# Patient Record
Sex: Male | Born: 1995 | Race: Black or African American | Hispanic: No | Marital: Single | Smoking: Current every day smoker
Health system: Southern US, Community
[De-identification: ages and names within clinical notes are randomized; demographics above are authoritative.]

## PROBLEM LIST (undated history)

## (undated) ENCOUNTER — Emergency Department (HOSPITAL_COMMUNITY): Admission: EM | Payer: Self-pay | Source: Home / Self Care

## (undated) DIAGNOSIS — Y249XXA Unspecified firearm discharge, undetermined intent, initial encounter: Secondary | ICD-10-CM

## (undated) DIAGNOSIS — F319 Bipolar disorder, unspecified: Secondary | ICD-10-CM

## (undated) DIAGNOSIS — F32A Depression, unspecified: Secondary | ICD-10-CM

## (undated) DIAGNOSIS — D696 Thrombocytopenia, unspecified: Secondary | ICD-10-CM

## (undated) DIAGNOSIS — F209 Schizophrenia, unspecified: Secondary | ICD-10-CM

## (undated) DIAGNOSIS — W3400XA Accidental discharge from unspecified firearms or gun, initial encounter: Secondary | ICD-10-CM

---
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
## 2013-07-11 ENCOUNTER — Telehealth: Payer: Self-pay | Admitting: Oncology

## 2013-07-11 NOTE — Telephone Encounter (Signed)
S/W PT'S MOTHER IN REF TO NP APPT. ON 07/28/13@10 :30 REFERRING DR August Saucer DX LOW PLATELET COUNT MIALED NP PACKET

## 2013-07-11 NOTE — Telephone Encounter (Signed)
C/D 07/11/13 for appt. 07/28/13

## 2013-07-22 ENCOUNTER — Other Ambulatory Visit: Payer: Self-pay | Admitting: Oncology

## 2013-07-22 DIAGNOSIS — D696 Thrombocytopenia, unspecified: Secondary | ICD-10-CM

## 2013-07-28 ENCOUNTER — Telehealth: Payer: Self-pay | Admitting: Oncology

## 2013-07-28 ENCOUNTER — Other Ambulatory Visit (HOSPITAL_BASED_OUTPATIENT_CLINIC_OR_DEPARTMENT_OTHER): Payer: Medicaid Other | Admitting: Lab

## 2013-07-28 ENCOUNTER — Encounter: Payer: Self-pay | Admitting: Oncology

## 2013-07-28 ENCOUNTER — Ambulatory Visit: Payer: Medicaid Other

## 2013-07-28 ENCOUNTER — Ambulatory Visit (HOSPITAL_BASED_OUTPATIENT_CLINIC_OR_DEPARTMENT_OTHER): Payer: Medicaid Other | Admitting: Oncology

## 2013-07-28 VITALS — BP 126/77 | HR 54 | Temp 97.6°F | Resp 20 | Ht 70.0 in | Wt 175.3 lb

## 2013-07-28 DIAGNOSIS — D696 Thrombocytopenia, unspecified: Secondary | ICD-10-CM

## 2013-07-28 DIAGNOSIS — D709 Neutropenia, unspecified: Secondary | ICD-10-CM

## 2013-07-28 DIAGNOSIS — D72820 Lymphocytosis (symptomatic): Secondary | ICD-10-CM

## 2013-07-28 LAB — CBC WITH DIFFERENTIAL/PLATELET
Basophils Absolute: 0 10*3/uL (ref 0.0–0.1)
EOS%: 3.4 % (ref 0.0–7.0)
HGB: 15.1 g/dL (ref 13.0–17.1)
MCH: 29.5 pg (ref 27.2–33.4)
MCV: 88.1 fL (ref 79.3–98.0)
MONO%: 9.1 % (ref 0.0–14.0)
NEUT#: 1.1 10*3/uL — ABNORMAL LOW (ref 1.5–6.5)
RBC: 5.12 10*6/uL (ref 4.20–5.82)
RDW: 13.1 % (ref 11.0–14.6)
lymph#: 2 10*3/uL (ref 0.9–3.3)
nRBC: 0 % (ref 0–0)

## 2013-07-28 LAB — COMPREHENSIVE METABOLIC PANEL (CC13)
ALT: 15 U/L (ref 0–55)
AST: 20 U/L (ref 5–34)
Albumin: 4.3 g/dL (ref 3.5–5.0)
Alkaline Phosphatase: 131 U/L (ref 40–150)
Glucose: 104 mg/dl (ref 70–140)
Potassium: 4.2 mEq/L (ref 3.5–5.1)
Sodium: 140 mEq/L (ref 136–145)
Total Bilirubin: 1.09 mg/dL (ref 0.20–1.20)
Total Protein: 7.6 g/dL (ref 6.4–8.3)

## 2013-07-28 NOTE — Progress Notes (Signed)
Patient has not been to [place] or been around anyone that he knows of, no financial issues with checking in new patient. He doesn't know PCP

## 2013-07-28 NOTE — Progress Notes (Signed)
Note dictated

## 2013-07-28 NOTE — Telephone Encounter (Signed)
gv adn printed appt sched and avs for pt for June 2015  °

## 2013-07-29 NOTE — Progress Notes (Signed)
CC:   Eric L. August Saucer, M.D.  REASON FOR REFERRAL:  Thrombocytopenia.  HISTORY OF PRESENT ILLNESS:  This is a pleasant 17 year old [place] American gentleman, currently of [place] but has been living recently in [place] due to job restrictions.  He is in excellent health and shape without really any complaints.  As part of a routine physical while he was living in [place], he was noted to have thrombocytopenia on a CBC.  His platelet count was 95, his total white cell count was 3.6, hemoglobin was 15.  He had an essentially normal differential except for a slightly increased lymphocyte of 51%, and he was recommended to have a repeat blood check here in [place].  He did have a CBC repeated on 06/30/2013 and showed his white cell count was 3.3, platelet count was 110, and his lymphocyte percentage was 64%, and the rest of his laboratory data all essentially normal.  For that reason, patient referred to me for evaluation.  Clinically, the patient is asymptomatic.  He is not reporting any fevers, chills, sweats, or weight loss.  Does not report any appetite changes, decline in his energy or performance status.  Does not report any fevers.  Does not report any abdominal pain, chest pain, difficulty breathing.  Does not report any hematochezia or melena.  Does not report any epistaxis.  Does not report any easy bruisability.  Does not recall any recent illnesses. Does not report any over-the-counter medications or supplements.  He is very active and works out regularly, although he denied any health supplements.  REVIEW OF SYSTEMS:  Otherwise unremarkable, and the rest of his review of systems is in the history of present illness.  PAST MEDICAL HISTORY:  None.  PAST SURGICAL HISTORY:  He has had a hernia repair as well as tracheal surgery as a child.  MEDICATIONS:  None.  ALLERGIES:  None.  SOCIAL HISTORY:  He is single.  Denied any alcohol or tobacco abuse.  FAMILY HISTORY:   He was accompanied by his mother today, who provided most of that.  There were really no health issues noted in his family. There is no history of any leukemias or cancers or any blood disorders or thrombocytopenia.  PHYSICAL EXAMINATION:  General:  Alert, awake gentleman, appeared in no active distress.  Vital signs:  His blood pressure is 126/77, pulse 54, respiration 20.  He was afebrile.  HEENT:  Head is normocephalic, atraumatic.  Pupils equal and round, reactive to light.  Oral mucosa moist and pink.  Neck:  Supple without lymphadenopathy.  Heart:  Regular rate and rhythm.  S1 and S2.  Lungs:  Clear to auscultation without rhonchi, wheeze or dullness to percussion.  Abdomen:  Soft, nontender. Could not appreciate any splenomegaly.  His liver is not enlarged. Neurologic:  Intact motor, sensory, and deep tendon reflexes.  LABORATORY DATA:  Hemoglobin of 15, white cell count 3.5, platelet count of 111.  His neutrophil percentage is slightly down at 28%, lymphocytes of 57%.  His platelet count is 111.  Peripheral smear was personally reviewed today and essentially was unremarkable.  His platelet counts were decreased in number but enlarged slightly in size, but really no other abnormalities.  No schistocytes or red cell fragmentation.  His white cells appeared normal.  ASSESSMENT AND PLAN:  An 17 year old gentleman with the following issues: 1. Thrombocytopenia is very mild and fluctuating at this point.     Dating back for the last few months, it was as low as 95, now  it is     111.  Differential diagnosis at this time includes reactive     thrombocytopenia, possible autoimmune thrombocytopenia such as ITP.     I do not see any medications to suggest that effect.  He denied any     other supplements or over-the-counter medication.  I doubt this is     a sign of a lymphoproliferative disorder or a myeloproliferative     disorder.  For the time being, we will continue the observation  and     repeat count in about 6 months or so. 2. Neutropenia and mild lymphocytosis.  Again, I do not see any     evidence to suggest lymphoproliferative disorder.  I think it is     important to monitor his counts to make sure this does not     translate into a blood disorder.  I see no evidence of that at this     time.  All his questions were answered today.    ______________________________ Benjiman Core, M.D. FNS/MEDQ  D:  07/28/2013  T:  07/29/2013  Job:  [id number]

## 2013-10-28 ENCOUNTER — Telehealth: Payer: Self-pay | Admitting: Oncology

## 2013-10-28 NOTE — Telephone Encounter (Signed)
[facility]

## 2013-11-15 ENCOUNTER — Telehealth: Payer: Self-pay | Admitting: Oncology

## 2013-11-15 NOTE — Telephone Encounter (Signed)
, °

## 2014-03-28 ENCOUNTER — Ambulatory Visit: Payer: Medicaid Other | Admitting: Oncology

## 2014-03-28 ENCOUNTER — Other Ambulatory Visit: Payer: Medicaid Other

## 2014-03-29 ENCOUNTER — Other Ambulatory Visit: Payer: Medicaid Other

## 2014-03-29 ENCOUNTER — Ambulatory Visit: Payer: Medicaid Other | Admitting: Oncology

## 2015-04-01 ENCOUNTER — Emergency Department (HOSPITAL_COMMUNITY)
Admission: EM | Admit: 2015-04-01 | Discharge: 2015-04-07 | Disposition: A | Discharge status: Home / Self Care | Payer: Self-pay | Attending: Emergency Medicine | Admitting: Emergency Medicine

## 2015-04-01 ENCOUNTER — Encounter (HOSPITAL_COMMUNITY): Payer: Self-pay | Admitting: *Deleted

## 2015-04-01 DIAGNOSIS — F29 Unspecified psychosis not due to a substance or known physiological condition: Secondary | ICD-10-CM | POA: Insufficient documentation

## 2015-04-01 DIAGNOSIS — F1210 Cannabis abuse, uncomplicated: Secondary | ICD-10-CM | POA: Diagnosis present

## 2015-04-01 DIAGNOSIS — F23 Brief psychotic disorder: Secondary | ICD-10-CM | POA: Diagnosis present

## 2015-04-01 LAB — COMPREHENSIVE METABOLIC PANEL
ALBUMIN: 4.7 g/dL (ref 3.5–5.0)
ALT: 17 U/L (ref 17–63)
AST: 32 U/L (ref 15–41)
Alkaline Phosphatase: 115 U/L (ref 38–126)
Anion gap: 12 (ref 5–15)
BILIRUBIN TOTAL: 2.7 mg/dL — AB (ref 0.3–1.2)
BUN: 15 mg/dL (ref 6–20)
CALCIUM: 10.1 mg/dL (ref 8.9–10.3)
CO2: 27 mmol/L (ref 22–32)
Chloride: 101 mmol/L (ref 101–111)
Creatinine, Ser: 1.17 mg/dL (ref 0.61–1.24)
Glucose, Bld: 96 mg/dL (ref 65–99)
Potassium: 3.3 mmol/L — ABNORMAL LOW (ref 3.5–5.1)
SODIUM: 140 mmol/L (ref 135–145)
TOTAL PROTEIN: 7.7 g/dL (ref 6.5–8.1)

## 2015-04-01 LAB — CBC
HEMATOCRIT: 48.9 % (ref 39.0–52.0)
Hemoglobin: 16.6 g/dL (ref 13.0–17.0)
MCH: 29.4 pg (ref 26.0–34.0)
MCHC: 33.9 g/dL (ref 30.0–36.0)
MCV: 86.5 fL (ref 78.0–100.0)
PLATELETS: 98 10*3/uL — AB (ref 150–400)
RBC: 5.65 MIL/uL (ref 4.22–5.81)
RDW: 13.1 % (ref 11.5–15.5)
WBC: 5.1 10*3/uL (ref 4.0–10.5)

## 2015-04-01 LAB — ACETAMINOPHEN LEVEL: Acetaminophen (Tylenol), Serum: <10 ug/mL — ABNORMAL LOW (ref 10–30)

## 2015-04-01 LAB — SALICYLATE LEVEL

## 2015-04-01 LAB — ETHANOL

## 2015-04-01 MED ORDER — ZOLPIDEM TARTRATE 5 MG PO TABS
5.0000 mg | ORAL_TABLET | Freq: Every evening | ORAL | Status: DC | PRN
  Administered 2015-04-03: 5 mg via ORAL
  Filled 2015-04-01 (×2): qty 1

## 2015-04-01 MED ORDER — IBUPROFEN 400 MG PO TABS: 600.0000 mg | ORAL_TABLET | Freq: Three times a day (TID) | ORAL | Status: DC | PRN

## 2015-04-01 MED ORDER — LORAZEPAM 1 MG PO TABS
1.0000 mg | ORAL_TABLET | Freq: Three times a day (TID) | ORAL | Status: DC | PRN
  Administered 2015-04-03: 1 mg via ORAL
  Filled 2015-04-01 (×3): qty 1

## 2015-04-01 MED ORDER — ZIPRASIDONE MESYLATE 20 MG IM SOLR
10.0000 mg | INTRAMUSCULAR | Status: DC | PRN
  Administered 2015-04-01 – 2015-04-04 (×3): 10 mg via INTRAMUSCULAR
  Filled 2015-04-01 (×4): qty 20

## 2015-04-01 MED ORDER — ACETAMINOPHEN 325 MG PO TABS: 650.0000 mg | ORAL_TABLET | ORAL | Status: DC | PRN

## 2015-04-01 MED ORDER — ONDANSETRON HCL 4 MG PO TABS: 4.0000 mg | ORAL_TABLET | Freq: Three times a day (TID) | ORAL | Status: DC | PRN

## 2015-04-01 MED ORDER — NICOTINE 21 MG/24HR TD PT24: 21.0000 mg | MEDICATED_PATCH | Freq: Every day | TRANSDERMAL | Status: DC

## 2015-04-01 NOTE — ED Notes (Signed)
Safety sitter at the bedside. °

## 2015-04-01 NOTE — [facility] Counselor (Signed)
Disposition: Per Hulan Fess, NP Pt meets inpt tx criteria. Per Rutha Bouchard, [facility] is at capacity. TTS to seek placement elsewhere.

## 2015-04-01 NOTE — ED Notes (Signed)
Staffing office notified of a sitter need.  Security at the bedside now.  Chg rn notified of pts needs

## 2015-04-01 NOTE — ED Provider Notes (Signed)
CSN: [id number]     Arrival date & time 04/01/15  1805 History   First MD Initiated Contact with Patient 04/01/15 1831     Chief Complaint  Patient presents with  . poss overdose      (Consider location/radiation/quality/duration/timing/severity/associated sxs/prior Treatment) HPI Comments: Patient brought to the emergency department by his grandmother. Patient normally lives with his parents, they are out of town. The grandmother came over to stay with him, but he disappeared on Wednesday, June 15. He did not return until Friday, June 17. It's not clear what he was doing, because when he returned he was somewhat agitated, confused, acting strangely. He has progressively worsened since that time. Family reports that he has not slept since he came home on the 17th. He has been progressively more agitated and seems to be speaking to individuals that are not there. At arrival to the ER he was extremely agitated and required restraints and took an extremely long time to verbally de-escalate him to the point where we could draw labs and get him to come to place in the room. He requires constant redirection by his grandmother. He is talking to someone that is not present in the room. He does not answer questions appropriately, is extremely tangential. He does not have any history of mental illness.   History reviewed. No pertinent past medical history. History reviewed. No pertinent past surgical history. No family history on file. History  Substance Use Topics  . Smoking status: Never Smoker   . Smokeless tobacco: Not on file  . Alcohol Use: No    Review of Systems  Unable to perform ROS: Psychiatric disorder      Allergies  Shellfish-derived products  Home Medications   Prior to Admission medications   Not on File   BP 112/55 mmHg  Pulse 82  Temp(Src) 97.5 F (36.4 C) (Oral)  Resp 16  Ht  (1.803 m)  SpO2 98% Physical Exam  Constitutional: He is oriented to person,  place, and time. He appears well-developed and well-nourished. No distress.  HENT:  Head: Normocephalic and atraumatic.  Right Ear: Hearing normal.  Left Ear: Hearing normal.  Nose: Nose normal.  Mouth/Throat: Oropharynx is clear and moist and mucous membranes are normal.  Eyes: Conjunctivae and EOM are normal. Pupils are equal, round, and reactive to light.  Neck: Normal range of motion. Neck supple.  Cardiovascular: Regular rhythm, S1 normal and S2 normal.  Exam reveals no gallop and no friction rub.   No murmur heard. Pulmonary/Chest: Effort normal and breath sounds normal. No respiratory distress. He exhibits no tenderness.  Abdominal: Soft. Normal appearance and bowel sounds are normal. There is no hepatosplenomegaly. There is no tenderness. There is no rebound, no guarding, no tenderness at McBurney's point and negative Murphy's sign. No hernia.  Musculoskeletal: Normal range of motion.  Neurological: He is alert and oriented to person, place, and time. He has normal strength. No cranial nerve deficit or sensory deficit. Coordination normal. GCS eye subscore is 4. GCS verbal subscore is 5. GCS motor subscore is 6.  Skin: Skin is warm, dry and intact. No rash noted. No cyanosis.  Psychiatric: His affect is labile and inappropriate. His speech is rapid and/or pressured. He is agitated, aggressive and actively hallucinating. Cognition and memory are impaired.  Nursing note and vitals reviewed.   ED Course  Procedures (including critical care time) Labs Review Labs Reviewed  ACETAMINOPHEN LEVEL - Abnormal; Notable for the following:    Acetaminophen (Tylenol),  Serum <10 (*)    All other components within normal limits  CBC - Abnormal; Notable for the following:    Platelets 98 (*)    All other components within normal limits  COMPREHENSIVE METABOLIC PANEL - Abnormal; Notable for the following:    Potassium 3.3 (*)    Total Bilirubin 2.7 (*)    All other components within normal  limits  ETHANOL  SALICYLATE LEVEL  URINE RAPID DRUG SCREEN, HOSP PERFORMED    Imaging Review No results found.   EKG Interpretation   Date/Time:  Sunday April 01 2015 18:19:07 EDT Ventricular Rate:  80 PR Interval:  150 QRS Duration: 94 QT Interval:  366 QTC Calculation: 422 R Axis:   97 Text Interpretation:  Normal sinus rhythm with sinus arrhythmia Rightward  axis Nonspecific ST and T wave abnormality Abnormal ECG No previous  tracing Confirmed by POLLINA  MD, CHRISTOPHER [phone]) on 04/01/2015 6:32:49  PM      MDM   Final diagnoses:  Psychosis, unspecified psychosis type    Patient brought to the ER for acute psychosis. No history of psychiatric problems. Workup normal. IVC initiated because patient required restraining and Geodon. TTS eval - patient is medically clear.    Gilda Crease, MD 04/02/15 [phone]

## 2015-04-01 NOTE — ED Notes (Signed)
Samuel Martin [phone], grandmother.

## 2015-04-01 NOTE — BH Assessment (Addendum)
Tele Assessment Note   Samuel Martin is a 19 y.o. male presenting to [facility] with bizarre behavior and psychotic symptoms. Pt is accompanied by his aunt. Pt is combative upon arrival to ED and requires restraints and administration of Geodon at one point. He responds to internal stimuli, mumbles incoherently to himself, and provides irrelevant responses to questions asked by counselor and other medical staff in the ED. At other times, pt closes his eyes and refuses to answer questions altogether. Pt's mood alternates between angry and histrionic, becoming slightly tearful. Pt was very guarded during TTS assessment and denied any sx of mood disorder. He also shook his head "no" when asked about A/VH but it was apparent that the pt had been responding to internal stimuli.  Counselor spoke with pt's grandmother privately prior to TTS assessment. Pt's grandmother states that she has never witnessed this type of behavior in the pt before and suspects that he "got a hold of some bad drugs". She states that pt disappeared from Wed - Fri and she has no idea where he was or what he was doing. Pt's grandmother states that pt has admitted to marijuana use before but no other substances. She reports that the pt has no prior hx of mental illness or psychiatric hospitalizations. Pt is staying with his grandmother only temporarily. He usually resides with his parents. Counselor unable to verify if pt's psychosis is substance-induced or if the pt is experiencing his first psychotic episode due to mental illness, as the pt has not provided a urine sample to medical staff as of 23:57 on 04/01/15.  Per Hulan Fess, NP Pt meets inpt tx criteria. Per Rutha Bouchard, [facility] is at capacity. TTS to seek placement elsewhere.  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 No pertinent past medical history. Axis IV: other psychosocial or environmental problems Axis  V: 31-40 impairment in reality testing  Past Medical History: History reviewed. No pertinent past medical history.  History reviewed. No pertinent past surgical history.  Family History: No family history on file.  Social History:  reports that he has never smoked. He does not have any smokeless tobacco history on file. He reports that he does not drink alcohol. His drug history is not on file.  Additional Social History:  Alcohol / Drug Use Pain Medications: See PTA List Prescriptions: See PTA List Over the Counter: See PTA List History of alcohol / drug use?: Yes Longest period of sobriety (when/how long): UTA Negative Consequences of Use:  (UTA) Substance #1 Name of Substance 1: THC 1 - Age of First Use: Unknown 1 - Amount (size/oz): Unknown 1 - Frequency: Occasional 1 - Duration: Unknown 1 - Last Use / Amount: Unknown; Pt's grandmother reports that pt has admitted to occasional marijuana use  CIWA: CIWA-Ar BP:  (unable to assess and patient is agitated. ) Pulse Rate: 68 Nausea and Vomiting: no nausea and no vomiting Tactile Disturbances: severe hallucinations Tremor: no tremor Auditory Disturbances: severe hallucinations Paroxysmal Sweats: no sweat visible Visual Disturbances: severe hallucinations Anxiety: equivalent to acute panic states as seen in severe delirium or acute schizophrenic reactions Agitation: moderately fidgety and restless Orientation and Clouding of Sensorium: oriented and can do serial additions COWS: Clinical Opiate Withdrawal Scale (COWS) Resting Pulse Rate:  (unable to assess as patient is agitated) Sweating: No report of chills or flushing Restlessness: Reports difficulty sitting still, but is able to do so Pupil Size: Pupils pinned or normal size  for room light Bone or Joint Aches: Not present Runny Nose or Tearing: Not present GI Upset: No GI symptoms Tremor: No tremor Yawning: No yawning Anxiety or Irritability: Patient obviously  irritable/anxious Gooseflesh Skin: Skin is smooth  PATIENT STRENGTHS: (choose at least two) Average or above average intelligence Physical Health Supportive family/friends  Allergies:  Allergies  Allergen Reactions  . Shellfish-Derived Products Hives and Rash    Home Medications:  (Not in a hospital admission)  OB/GYN Status:  No LMP for male patient.  General Assessment Data Location of Assessment: [facility] ED TTS Assessment: In system Is this a Tele or Face-to-Face Assessment?: Tele Assessment Is this an Initial Assessment or a Re-assessment for this encounter?: Initial Assessment Marital status: Single Is patient pregnant?: No Pregnancy Status: No Living Arrangements: Parent Can pt return to current living arrangement?: Yes Admission Status: Voluntary Is patient capable of signing voluntary admission?: Yes Referral Source: Self/Family/Friend Insurance type: Medicaid     Crisis Care Plan Living Arrangements: Parent Name of Psychiatrist: None Name of Therapist: None  Education Status Is patient currently in school?: No Current Grade: na Highest grade of school patient has completed: na Name of school: na Contact person: na  Risk to self with the past 6 months Suicidal Ideation: No Has patient been a risk to self within the past 6 months prior to admission? : No Suicidal Intent: No Has patient had any suicidal intent within the past 6 months prior to admission? : No Is patient at risk for suicide?: No Suicidal Plan?: No Has patient had any suicidal plan within the past 6 months prior to admission? : No Access to Means: No Previous Attempts/Gestures: No How many times?: 0 (per grandmother) Other Self Harm Risks: Wandering, substance use Intentional Self Injurious Behavior: None Family Suicide History: Unknown Recent stressful life event(s):  (UTA) Persecutory voices/beliefs?: No Depression: No Substance abuse history and/or treatment for substance abuse?:  Yes Suicide prevention information given to non-admitted patients: Not applicable  Risk to Others within the past 6 months Homicidal Ideation: No Does patient have any lifetime risk of violence toward others beyond the six months prior to admission? : Unknown Thoughts of Harm to Others:  (UTA) Current Homicidal Intent: No Current Homicidal Plan: No Access to Homicidal Means: No History of harm to others?:  (UTA) Assessment of Violence: On admission Violent Behavior Description: Pt combative on admission, had to be put in restraints at one point Does patient have access to weapons?: No Criminal Charges Pending?:  (UTA) Does patient have a court date:  (UTA) Is patient on probation?:  (UTA)  Psychosis Hallucinations: Auditory Delusions: Unspecified (UTA)  Mental Status Report Appearance/Hygiene: In scrubs Eye Contact: Poor Motor Activity: Agitation Speech: Incoherent Level of Consciousness: Irritable Mood: Angry Affect: Irritable Anxiety Level: None Thought Processes: Irrelevant Judgement: Impaired Orientation: Not oriented Obsessive Compulsive Thoughts/Behaviors: None  Cognitive Functioning Concentration: Poor Memory: Unable to Assess IQ: Average Insight: Poor Impulse Control: Unable to Assess Appetite:  (UTA) Weight Loss:  (UTA) Weight Gain:  (UTA) Sleep: Unable to Assess Total Hours of Sleep:  (UTA) Vegetative Symptoms: Unable to Assess  ADLScreening [facility]) Patient's cognitive ability adequate to safely complete daily activities?: Yes Patient able to express need for assistance with ADLs?: Yes Independently performs ADLs?: Yes (appropriate for developmental age)  Prior Inpatient Therapy Prior Inpatient Therapy: No Prior Therapy Dates: na Prior Therapy Facilty/Provider(s): na Reason for Treatment: na  Prior Outpatient Therapy Prior Outpatient Therapy: No Prior Therapy Dates: na Prior Therapy  Facilty/Provider(s): na Reason for  Treatment: na Does patient have an ACCT team?: No Does patient have Intensive In-House Services?  : No Does patient have [facility] services? : No Does patient have P4CC services?: No  ADL Screening (condition at time of admission) Patient's cognitive ability adequate to safely complete daily activities?: Yes Is the patient deaf or have difficulty hearing?: No Does the patient have difficulty seeing, even when wearing glasses/contacts?: No Does the patient have difficulty concentrating, remembering, or making decisions?: Yes Patient able to express need for assistance with ADLs?: Yes Does the patient have difficulty dressing or bathing?: No Independently performs ADLs?: Yes (appropriate for developmental age) Does the patient have difficulty walking or climbing stairs?: No Weakness of Legs: None Weakness of Arms/Hands: None  Home Assistive Devices/Equipment Home Assistive Devices/Equipment: None    Abuse/Neglect Assessment (Assessment to be complete while patient is alone) Physical Abuse: Denies Verbal Abuse: Denies Sexual Abuse: Denies Exploitation of patient/patient's resources: Denies Self-Neglect: Denies Values / Beliefs Cultural Requests During Hospitalization: None Spiritual Requests During Hospitalization: None   Advance Directives (For Healthcare) Does patient have an advance directive?: No Would patient like information on creating an advanced directive?: No - patient declined information    Additional Information 1:1 In Past 12 Months?: No CIRT Risk: Yes Elopement Risk: No Does patient have medical clearance?: No     Disposition: Per Hulan Fess, NP Pt meets inpt tx criteria. Per Rutha Bouchard, [facility] is at capacity. TTS to seek placement elsewhere. Disposition Initial Assessment Completed for this Encounter: Yes Disposition of Patient: Other dispositions Other disposition(s): Other (Comment)  Cyndie Mull, [facility] Triage Specialist  04/01/2015 10:11 PM

## 2015-04-01 NOTE — ED Notes (Signed)
The pt has a history of taking drugs.  For 3-4 days he has  Been disappearing has been found wandering around.  His aunt is here with him.. The pt answers no questions  Sits stares staright ahead then puffs out his chest breathes fast then  Lies back in the chair.  He has started to perspire while sitting in the triage chair.     He told some one earlier today he had chest pain

## 2015-04-01 NOTE — ED Notes (Signed)
Verified with security that the patient has already been wanded.

## 2015-04-01 NOTE — ED Notes (Signed)
While attempting to get patient into room he became extremely agitated. 2 RNs attempted to assisted patient into room. He refused to go in the room and was rubbing the glass on the provider room and mumbking word salad fragments. When RNs attempted to reorient him he became very combative and attempted to leave via the EMS  Hallway. 4 security officers were present and placed him in a Crisis Prevention Intervention (CPI) hold and assisted him to the room. EDP made aware and at bedside. He stated to given the PRN Geodon. Geodon given. Pt did not sustain any injuries during the episode.

## 2015-04-01 NOTE — ED Notes (Signed)
With gentle coaxing able to get patient to change into wine scrubs and sit on bed. Family at bedside. Intermittent periods of calm versus puffing cheeks, breathing fast, and seemingly angry versus highly emotional and almost tearing. Seems to be talking or listening to someone... Mumbling phrases that do not make sense.

## 2015-04-02 NOTE — ED Notes (Signed)
Samuel Martin, Pts Grandmother here visiting patient, stating she would like [place] to have her number since patient's mother is out of the country in case we need to contact someone in regards to him. pts number: [phone]

## 2015-04-02 NOTE — Progress Notes (Signed)
CSW seeking inpatient Psych bed(s) for patient.   Referred to: [facility], [address], [facility], [place],   [place], [place] (message left- will fax if availability)  At Capacity: [facility], [facility], [place], [place],   Declined:   Reece Levy, MSW, [place] [phone]

## 2015-04-02 NOTE — ED Notes (Signed)
Dinner tray placed at bedside.

## 2015-04-02 NOTE — ED Notes (Signed)
Patient escorted to the restroom by sitter. Patient now refusing to return to his room. [employer] attempting to get patient back to room, Security called as well.

## 2015-04-02 NOTE — ED Notes (Signed)
Upon entering the room, pt screaming " why is she in there," when asked who he is talking about, patient did not respond. Pt now alternating between yelling out loud and whispering to himself, making comments about "reverse psychology" under his breath

## 2015-04-02 NOTE — ED Notes (Signed)
When this nurse was assessing patient and asking him questions, patient did not say anything, pt rocking back and forth making noises with his mouth and scrunching his face up.

## 2015-04-02 NOTE — ED Notes (Signed)
Pt yelling "what the fuck, what the fuck" at the tv, [employer] in hallway, no aggressive behavior from patient at this time.

## 2015-04-02 NOTE — ED Notes (Signed)
Patient watching tv on edge of his bed, yelling out "HEY, HEY" "Damn, Damn" at the television.

## 2015-04-02 NOTE — ED Provider Notes (Signed)
Sitting up in bed appearscomfortable . Denies complaint. Results for orders placed or performed during the hospital encounter of 04/01/15  Acetaminophen level  Result Value Ref Range   Acetaminophen (Tylenol), Serum <10 (L) 10 - 30 ug/mL  CBC  Result Value Ref Range   WBC 5.1 4.0 - 10.5 K/uL   RBC 5.65 4.22 - 5.81 MIL/uL   Hemoglobin 16.6 13.0 - 17.0 g/dL   HCT 41.7 40.8 - 14.4 %   MCV 86.5 78.0 - 100.0 fL   MCH 29.4 26.0 - 34.0 pg   MCHC 33.9 30.0 - 36.0 g/dL   RDW 81.8 56.3 - 14.9 %   Platelets 98 (L) 150 - 400 K/uL  Comprehensive metabolic panel  Result Value Ref Range   Sodium 140 135 - 145 mmol/L   Potassium 3.3 (L) 3.5 - 5.1 mmol/L   Chloride 101 101 - 111 mmol/L   CO2 27 22 - 32 mmol/L   Glucose, Bld 96 65 - 99 mg/dL   BUN 15 6 - 20 mg/dL   Creatinine, Ser 7.02 0.61 - 1.24 mg/dL   Calcium 63.7 8.9 - 85.8 mg/dL   Total Protein 7.7 6.5 - 8.1 g/dL   Albumin 4.7 3.5 - 5.0 g/dL   AST 32 15 - 41 U/L   ALT 17 17 - 63 U/L   Alkaline Phosphatase 115 38 - 126 U/L   Total Bilirubin 2.7 (H) 0.3 - 1.2 mg/dL   GFR calc non Af Amer >60 >60 mL/min   GFR calc Af Amer >60 >60 mL/min   Anion gap 12 5 - 15  Ethanol (ETOH)  Result Value Ref Range   Alcohol, Ethyl (B) <5 <5 mg/dL  Salicylate level  Result Value Ref Range   Salicylate Lvl <4.0 2.8 - 30.0 mg/dL   No results found.   Doug Sou, MD 04/02/15 [phone]

## 2015-04-02 NOTE — ED Notes (Signed)
Patient escorted back to his room by security. [employer] and security outside of room at this time.

## 2015-04-02 NOTE — ED Notes (Signed)
Patient jumping in place talking to TV. When I checked on patient he began talking incoherently at me and laughing.

## 2015-04-02 NOTE — ED Notes (Signed)
Patient would not allow tech to take BP and update vital signs.

## 2015-04-02 NOTE — ED Notes (Signed)
Security outside of room, pt calm at this time.

## 2015-04-02 NOTE — ED Notes (Addendum)
Pt jumping up and down and yelling, stating that he needs to use the restroom, pt shown the restroom, this nurse attempted to escort patient there. Pt then proceeded to urinate in the sink. Security called.

## 2015-04-02 NOTE — ED Notes (Addendum)
Pt continues to yell out curse words and talk loudly while watching tv. Pt continuing to yell "Wow" repeatedly, louder and louder.

## 2015-04-02 NOTE — ED Notes (Signed)
Environmental called to clean sink where patient urinated.

## 2015-04-02 NOTE — ED Notes (Signed)
Patient is yelling odd things randomly.  He is sitting on the bed talking to himself,  The sitter is at the bedside,.

## 2015-04-02 NOTE — ED Notes (Signed)
Pt continues to yell and curse at the television, exhibiting no aggressive behavior towards staff at this time.

## 2015-04-02 NOTE — ED Notes (Signed)
Pt refused to have vitals taken. I explained the process and reached for his arm and he moved his head towards my arm as if he was going to bite my arm. Pt kept refusing to have vitals taken stating "it's weird."

## 2015-04-03 DIAGNOSIS — F1210 Cannabis abuse, uncomplicated: Secondary | ICD-10-CM

## 2015-04-03 DIAGNOSIS — F29 Unspecified psychosis not due to a substance or known physiological condition: Secondary | ICD-10-CM | POA: Insufficient documentation

## 2015-04-03 DIAGNOSIS — F23 Brief psychotic disorder: Secondary | ICD-10-CM | POA: Diagnosis present

## 2015-04-03 LAB — RAPID URINE DRUG SCREEN, HOSP PERFORMED
Amphetamines: NOT DETECTED
BARBITURATES: NOT DETECTED
BENZODIAZEPINES: NOT DETECTED
COCAINE: NOT DETECTED
Opiates: NOT DETECTED
TETRAHYDROCANNABINOL: POSITIVE — AB

## 2015-04-03 MED ORDER — STERILE WATER FOR INJECTION IJ SOLN
INTRAMUSCULAR | Status: AC
  Administered 2015-04-03: 1.2 mL
  Filled 2015-04-03: qty 10

## 2015-04-03 NOTE — ED Notes (Addendum)
Sitter continuing to be at bedside. Pt now in bed resting. Will not disturb pt at this time for VS since pt has been awake for majority of night.

## 2015-04-03 NOTE — ED Notes (Signed)
[phone] [name] mother POA

## 2015-04-03 NOTE — Consult Note (Signed)
Telepsych Consultation   Reason for Consult:  Psychosis Referring Physician:  EDP Patient Identification: Samuel Martin MRN:  [id number] Principal Diagnosis: Acute psychosis Diagnosis:   Patient Active Problem List   Diagnosis Date Noted  . 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 Marijuana abuse [F12.10] 04/03/2015    Priority: High    Total Time spent with patient: 25 minutes  Subjective:   Samuel Martin is a 19 y.o. male patient admitted with reports of psychotic behavior and being combative with ED staff members upon arrival. This NP spoke to nursing staff and they report that pt had to be held down this morning by multiple people to get an in-and-out cath for a urine sample because he kept urinating on the floor instead of cooperating. Pt seen and chart reviewed. Pt will not cooperate with the assessment and continues to exhibit echolalia when asked questions such as: when pt is asked "where do you want to go?" he will respond with "that's where you want to go?" and this continued throughout the assessment. It presented in a confused, non-oppositional way and pt appeared to be attempting to truly understand the questions. Pt was making strange hand symbols and mumbling also. Unable to obtain a proper full assessment as pt did not understand the context of assessment questioning.   HPI:   Samuel Martin is a 19 y.o. male presenting to [facility] with bizarre behavior and psychotic symptoms. Pt is accompanied by his aunt. Pt is combative upon arrival to ED and requires restraints and administration of Geodon at one point. He responds to internal stimuli, mumbles incoherently to himself, and provides irrelevant responses to questions asked by counselor and other medical staff in the ED. At other times, pt closes his eyes and refuses to answer questions altogether. Pt's mood alternates between angry and histrionic, becoming slightly tearful. Pt was very guarded during TTS assessment  and denied any sx of mood disorder. He also shook his head "no" when asked about A/VH but it was apparent that the pt had been responding to internal stimuli.  Counselor spoke with pt's grandmother privately prior to TTS assessment. Pt's grandmother states that she has never witnessed this type of behavior in the pt before and suspects that he "got a hold of some bad drugs". She states that pt disappeared from Wed - Fri and she has no idea where he was or what he was doing. Pt's grandmother states that pt has admitted to marijuana use before but no other substances. She reports that the pt has no prior hx of mental illness or psychiatric hospitalizations. Pt is staying with his grandmother only temporarily. He usually resides with his parents. Counselor unable to verify if pt's psychosis is substance-induced or if the pt is experiencing his first psychotic episode due to mental illness, as the pt has not provided a urine sample to medical staff as of 23:57 on 04/01/15.  Past Medical History: History reviewed. No pertinent past medical history. History reviewed. No pertinent past surgical history. Family History: No family history on file. Social History:  History  Alcohol Use No     History  Drug Use Not on file    History   Social History  . Marital Status: Single    Spouse Name: N/A  . Number of Children: N/A  . Years of Education: N/A   Social History Main Topics  . Smoking status: Never Smoker   . Smokeless tobacco: Not on file  . Alcohol Use:  No  . Drug Use: Not on file  . Sexual Activity: Not on file   Other Topics Concern  . None   Social History Narrative   Additional Social History:    Pain Medications: See PTA List Prescriptions: See PTA List Over the Counter: See PTA List History of alcohol / drug use?: Yes Longest period of sobriety (when/how long): UTA Negative Consequences of Use:  (UTA) Name of Substance 1: THC 1 - Age of First Use: Unknown 1 - Amount  (size/oz): Unknown 1 - Frequency: Occasional 1 - Duration: Unknown 1 - Last Use / Amount: Unknown; Pt's grandmother reports that pt has admitted to occasional marijuana use                   Allergies:   Allergies  Allergen Reactions  . Shellfish-Derived Products Hives and Rash    Labs:  Results for orders placed or performed during the hospital encounter of 04/01/15 (from the past 48 hour(s))  Acetaminophen level     Status: Abnormal   Collection Time: 04/01/15  7:05 PM  Result Value Ref Range   Acetaminophen (Tylenol), Serum <10 (L) 10 - 30 ug/mL    Comment:        THERAPEUTIC CONCENTRATIONS VARY SIGNIFICANTLY. A RANGE OF 10-30 ug/mL MAY BE AN EFFECTIVE CONCENTRATION FOR MANY PATIENTS. HOWEVER, SOME ARE BEST TREATED AT CONCENTRATIONS OUTSIDE THIS RANGE. ACETAMINOPHEN CONCENTRATIONS >150 ug/mL AT 4 HOURS AFTER INGESTION AND >50 ug/mL AT 12 HOURS AFTER INGESTION ARE OFTEN ASSOCIATED WITH TOXIC REACTIONS.   CBC     Status: Abnormal   Collection Time: 04/01/15  7:05 PM  Result Value Ref Range   WBC 5.1 4.0 - 10.5 K/uL   RBC 5.65 4.22 - 5.81 MIL/uL   Hemoglobin 16.6 13.0 - 17.0 g/dL   HCT 48.9 39.0 - 52.0 %   MCV 86.5 78.0 - 100.0 fL   MCH 29.4 26.0 - 34.0 pg   MCHC 33.9 30.0 - 36.0 g/dL   RDW 13.1 11.5 - 15.5 %   Platelets 98 (L) 150 - 400 K/uL    Comment: SPECIMEN CHECKED FOR CLOTS REPEATED TO VERIFY PLATELET COUNT CONFIRMED BY SMEAR   Comprehensive metabolic panel     Status: Abnormal   Collection Time: 04/01/15  7:05 PM  Result Value Ref Range   Sodium 140 135 - 145 mmol/L   Potassium 3.3 (L) 3.5 - 5.1 mmol/L   Chloride 101 101 - 111 mmol/L   CO2 27 22 - 32 mmol/L   Glucose, Bld 96 65 - 99 mg/dL   BUN 15 6 - 20 mg/dL   Creatinine, Ser 1.17 0.61 - 1.24 mg/dL   Calcium 10.1 8.9 - 10.3 mg/dL   Total Protein 7.7 6.5 - 8.1 g/dL   Albumin 4.7 3.5 - 5.0 g/dL   AST 32 15 - 41 U/L   ALT 17 17 - 63 U/L   Alkaline Phosphatase 115 38 - 126 U/L   Total  Bilirubin 2.7 (H) 0.3 - 1.2 mg/dL   GFR calc non Af Amer >60 >60 mL/min   GFR calc Af Amer >60 >60 mL/min    Comment: (NOTE) The eGFR has been calculated using the CKD EPI equation. This calculation has not been validated in all clinical situations. eGFR's persistently <60 mL/min signify possible Chronic Kidney Disease.    Anion gap 12 5 - 15  Ethanol (ETOH)     Status: None   Collection Time: 04/01/15  7:05 PM  Result Value  Ref Range   Alcohol, Ethyl (B) <5 <5 mg/dL    Comment:        LOWEST DETECTABLE LIMIT FOR SERUM ALCOHOL IS 5 mg/dL FOR MEDICAL PURPOSES ONLY   Salicylate level     Status: None   Collection Time: 04/01/15  7:05 PM  Result Value Ref Range   Salicylate Lvl <3.7 2.8 - 30.0 mg/dL  Urine rapid drug screen (hosp performed)not at [facility]     Status: Abnormal   Collection Time: 04/03/15  8:20 AM  Result Value Ref Range   Opiates NONE DETECTED NONE DETECTED   Cocaine NONE DETECTED NONE DETECTED   Benzodiazepines NONE DETECTED NONE DETECTED   Amphetamines NONE DETECTED NONE DETECTED   Tetrahydrocannabinol POSITIVE (A) NONE DETECTED   Barbiturates NONE DETECTED NONE DETECTED    Comment:        DRUG SCREEN FOR MEDICAL PURPOSES ONLY.  IF CONFIRMATION IS NEEDED FOR ANY PURPOSE, NOTIFY LAB WITHIN 5 DAYS.        LOWEST DETECTABLE LIMITS FOR URINE DRUG SCREEN Drug Class       Cutoff (ng/mL) Amphetamine      1000 Barbiturate      200 Benzodiazepine   169 Tricyclics       678 Opiates          300 Cocaine          300 THC              50     Vitals: Blood pressure 148/87, pulse 90, temperature 98.2 F (36.8 C), temperature source Oral, resp. rate 18, height '5\' 11"'  (1.803 m), SpO2 100 %.  Risk to Self: Suicidal Ideation: No Suicidal Intent: No Is patient at risk for suicide?: No Suicidal Plan?: No Access to Means: No How many times?: 0 (per grandmother) Other Self Harm Risks: Wandering, substance use Intentional Self Injurious Behavior: None Risk to Others:  Homicidal Ideation: No Thoughts of Harm to Others:  (UTA) Current Homicidal Intent: No Current Homicidal Plan: No Access to Homicidal Means: No History of harm to others?:  (UTA) Assessment of Violence: On admission Violent Behavior Description: Pt combative on admission, had to be put in restraints at one point Does patient have access to weapons?: No Criminal Charges Pending?:  (UTA) Does patient have a court date:  (UTA) Prior Inpatient Therapy: Prior Inpatient Therapy: No Prior Therapy Dates: na Prior Therapy Facilty/Provider(s): na Reason for Treatment: na Prior Outpatient Therapy: Prior Outpatient Therapy: No Prior Therapy Dates: na Prior Therapy Facilty/Provider(s): na Reason for Treatment: na Does patient have an ACCT team?: No Does patient have Intensive In-House Services?  : No Does patient have [facility] services? : No Does patient have P4CC services?: No  Current Facility-Administered Medications  Medication Dose Route Frequency Provider Last Rate Last Dose  . acetaminophen (TYLENOL) tablet 650 mg  650 mg Oral Q4H PRN Orpah Greek, MD      . ibuprofen (ADVIL,MOTRIN) tablet 600 mg  600 mg Oral Q8H PRN Orpah Greek, MD      . LORazepam (ATIVAN) tablet 1 mg  1 mg Oral Q8H PRN Orpah Greek, MD   1 mg at 04/03/15 0256  . ondansetron (ZOFRAN) tablet 4 mg  4 mg Oral Q8H PRN Orpah Greek, MD      . ziprasidone (GEODON) injection 10 mg  10 mg Intramuscular Q4H PRN Orpah Greek, MD   10 mg at 04/01/15 2125  . zolpidem (AMBIEN) tablet 5 mg  5  mg Oral QHS PRN Orpah Greek, MD   5 mg at 04/03/15 0256   No current outpatient prescriptions on file.    Musculoskeletal: UTO, camera  Psychiatric Specialty Exam: Physical Exam  Review of Systems  Psychiatric/Behavioral:       Pt agitated, inappropriate, labile  All other systems reviewed and are negative.   Blood pressure 148/87, pulse 90, temperature 98.2 F (36.8 C),  temperature source Oral, resp. rate 18, height '5\' 11"'  (1.803 m), SpO2 100 %.There is no weight on file to calculate BMI.  General Appearance: Bizarre and Disheveled  Eye Contact::  Fair  Speech:  Clear and Coherent  Volume:  Increased  Mood:  Angry, Dysphoric and Irritable  Affect:  Inappropriate and Labile  Thought Process:  Disorganized, Irrelevant, Loose and Tangential  Orientation:  UTO, pt could not answer properly  Thought Content:  UTO, pt could not answer properly  Suicidal Thoughts:  UTO, pt could not answer properly  Homicidal Thoughts:  UTO, pt could not answer properly  Memory:  UTO, pt could not answer properly  Judgement:  Impaired  Insight:  Lacking  Psychomotor Activity:  Increased  Concentration:  Poor  Recall:  Poor  Fund of Knowledge:UTO, pt could not answer properly  Language: Fair  Akathisia:  No  Handed:    AIMS (if indicated):     Assets:  Resilience  ADL's:  Impaired  Cognition: WNL  Sleep:      Medical Decision Making: Review or order clinical lab tests (1), Established Problem, Worsening (2), Review of Medication Regimen & Side Effects (2) and Review of New Medication or Change in Dosage (2)   Treatment Plan Summary: Daily contact with patient to assess and evaluate symptoms and progress in treatment and Medication management  Plan:  Recommend psychiatric Inpatient admission when medically cleared.  Disposition:  -Inpatient psychiatric hospitalization for safety and stabilization.  Benjamine Mola, FNP-BC 04/03/2015 9:43 AM

## 2015-04-03 NOTE — ED Notes (Signed)
Pt had incontinent episode in bed. Linens changed and pt given new scrubs. Pt not answering questions, staring blankly and talking to himself. Pt not violent/aggressive at this time.

## 2015-04-03 NOTE — ED Notes (Signed)
Pt is speaking with tele psych.

## 2015-04-03 NOTE — ED Notes (Signed)
The patient is not aggressive but will not allow [place] to get his vitals.  He says incoherent words to himself but withdraws his arm when I try to get his blood pressure.

## 2015-04-03 NOTE — Progress Notes (Signed)
Referral for IP treatment has been followed-up at: [facility] - no answer. [facility] - per Nicholos Johns, can't take patient without insurance. Has everything except adult beds. [facility] - per Andie, assessment team is busy, low acuity beds open. [facility] - per Aimee, adult beds and some adolescent beds open now, referral faxed. [facility] - per Gala Murdoch, fax referral for review.  On wait-list at: [facility]  Declined at: [facility] - per Wandra Mannan, due to behavioral acuity  At capacity: [facility]  CSW will continue to seek placement.  Melbourne Abts, LCSWA Disposition staff 04/03/2015 5:49 PM

## 2015-04-03 NOTE — ED Notes (Signed)
Upon entering room, Tech noticed that pt had right hand in a fist. Pt was asked by Tech to put "fisted hand" down and pt complied. Pt asked several times to open mouth for oral temp, but pt would not comply. Informed RN.

## 2015-04-03 NOTE — Progress Notes (Signed)
CSW seeking inpatient Psych bed(s) for patient.   Referred to: [facility]- per Claris Che, possible beds opening later today [facility]-per Broadus John, beds available  [facility]- per August Saucer, reviewing for low acuity [facility]- to review [facility]- per Corrie Dandy  At Capacity:   [facility]  Wait List:  [facility]-per Kandis Nab, MSW, [place]  [phone]

## 2015-04-03 NOTE — ED Notes (Signed)
Pt resting quietly at the time. Sitter at bedside.

## 2015-04-04 MED ORDER — LORAZEPAM 2 MG/ML IJ SOLN
1.0000 mg | Freq: Once | INTRAMUSCULAR | Status: AC
  Administered 2015-04-04: 1 mg via INTRAMUSCULAR
  Filled 2015-04-04: qty 1

## 2015-04-04 MED ORDER — STERILE WATER FOR INJECTION IJ SOLN
INTRAMUSCULAR | Status: AC
  Filled 2015-04-04: qty 10

## 2015-04-04 NOTE — ED Notes (Signed)
Vital signs deferred until patient wakes up for dinner.

## 2015-04-04 NOTE — ED Notes (Signed)
Pt. Received applesauce and coke for nighttime snack.

## 2015-04-04 NOTE — ED Notes (Addendum)
Pt started getting really agitated with staff; pt balling fist up at staff and [employer]; Pt was given IM ativan per Dr.Otter verbal order with [employer] assistance due to pt kicking and attempting to bit staff; Pt was offered PO med but held med cup in hand for about 10 minutes; PO ativan was recovered from pt's hand; Pt skin is warm and dry RR is 18

## 2015-04-04 NOTE — Progress Notes (Addendum)
1442: patient currently on [facility] waitlist per [facility].     Referral completed for patient to be reviewed by [facility]. Authorization:  [id number] Dates: 6/22-6/28  Phone Assessment to be completed and patient reviewed by medical at [facility].  Deretha Emory, MSW Clinical Social Work: Emergency Room [phone]

## 2015-04-04 NOTE — Progress Notes (Addendum)
Referral for IP treatment has been followed-up at:  ' Under review:  [facility] (refaxed referral 6/22pm)  On wait-list at: [facility] (at capacity this morning)  Declined at: [facility] - per Tameka, due to behavioral acuity  At capacity: [facility]  CSW will continue to seek placement. Per Dahlia Client, CSW at [facility], [facility] referral being made as well.    Reece Levy, MSW, Theresia Majors [phone]

## 2015-04-04 NOTE — ED Notes (Signed)
Pt went to the bathroom and refused to go back to room; Pt standing in door why with threaten posture; [employer] on standby and Charge RN notified

## 2015-04-04 NOTE — ED Notes (Signed)
Pt has been asked numerous times to sit down. Pt has been standing up and jumping around (as if doing exercises). Ophelia Charter is concerned that pt may fall. Tech, Corporate treasurer do not want to force pt to sit down because pt is not being combative, at this time.

## 2015-04-04 NOTE — ED Notes (Signed)
Family at bedside. 

## 2015-04-04 NOTE — ED Notes (Signed)
2 Security assist and 2 Rn assist with admin geodon

## 2015-04-04 NOTE — ED Notes (Signed)
Assisted in administration of medication for patient.

## 2015-04-04 NOTE — ED Notes (Signed)
Pt has been asked numerous times to sit down by [employer]. Pt complied, when Tech went to room and asked pt to sit down.

## 2015-04-04 NOTE — ED Notes (Signed)
Pt standing outside of room. Pt refusing to go back into room. Pt appears to want to take off running. Security notified and at bedside. Pt escorted back into room.

## 2015-04-04 NOTE — ED Notes (Signed)
Pt walking out of room and wants to know why he is here and how long he has been here. Pt refusing to go back in room. Security notified. Pt currently sitting in room.

## 2015-04-04 NOTE — ED Notes (Addendum)
Pt asked, "Am I retarded?"

## 2015-04-04 NOTE — ED Notes (Signed)
Pt is lying in bed calm; Pt following staff commands; vitals WNL

## 2015-04-04 NOTE — ED Notes (Signed)
Pt continues to refuses to follow commands of staff; refused to have vitals taken by EMT; Currently [employer] and Charge RN at door way; pt is allowing vitals to be taken at this time; Pt is back sitting on the bed and appears to be calm; Pts skin still remain warm and dry with RR at 18-20

## 2015-04-05 NOTE — Progress Notes (Signed)
CSW spoke with Samuel Martin at [facility] who reports they have availability and reqesting I refax referral as he does not see it. Clinicals refaxed and confirmed by phone with Samuel Martin that he has rec'd and is reviewing. He will advise- Reece Levy, MSW, Theresia Majors  [phone]

## 2015-04-05 NOTE — Progress Notes (Signed)
LCSW met with patient grandmother at the bedside. Grandmother asking questions regarding patient's disposition, placement, and medications.  Patient per grandmother has always been a sheltered kid, compliant behaviors, that was quiet and respectful. Mother is coming from being out of town in the [place] and will be at hospital on 04/05/15.  Grandmother reports patient stays with mother and grandmother.  On Friday 6/17 patient did not come home and this was the first time he stayed out all night.  Patient came home in the morning and his sisters called the grandmother reporting patient was acting funny and not himself.  Patient left again and stayed out all night again.  Came home Sunday morning in which grandmother attempted to take patient to the hospital.  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  Grandmother reports this is not his baseline and was very tearful watching him not act himself.  Grandmother agreeable for inpatient admission. Reports he has no current outpatient or history of inpatient admission.  Lane Hacker, MSW Clinical Social Work: Emergency Room [phone]

## 2015-04-05 NOTE — ED Notes (Signed)
Sitter called out to say pt. Was talking to himself and stating he wasn't feeling well. This RN tried to see if pt. Would take some PRN medication and pt. Declined for the 3rd time tonight. This RN informed sitter that pt. Is noncompliant and just to keep watch of him. Pt. Resting in bed at this time.

## 2015-04-05 NOTE — ED Notes (Signed)
Pt with sitter at bedside; alert and cooperative at this time, Pt remains nonverbal; will move lips, unable to comprehend speech

## 2015-04-05 NOTE — ED Notes (Signed)
Pt awake, ate all his breakfast. Pt talking about wanting to take a shower soon. Sitter at bedside

## 2015-04-05 NOTE — ED Notes (Signed)
Patient refused snacks and drink when offered.

## 2015-04-05 NOTE — ED Notes (Signed)
Pt standing in room watching tv. Pt cooperative at this time. Requesting to call mother

## 2015-04-05 NOTE — Progress Notes (Signed)
Writer tried to reach [facility] to inquire about patient's referral. Per Rose at [facility], patient is declined by MD due to acuity.  CSW will continue to follow up with [facility] referral.  Melbourne Abts, LCSWA Disposition staff 04/05/2015 4:42 PM

## 2015-04-05 NOTE — ED Notes (Signed)
Pt. Ambulated to the restroom with sitter at pt side

## 2015-04-05 NOTE — ED Notes (Signed)
Phone number from chart dialed for patient; patient held phone in hands until instructed to place phone to ear. No answer from mother.

## 2015-04-05 NOTE — ED Notes (Addendum)
Patient in the room watching T.V. 

## 2015-04-05 NOTE — ED Notes (Signed)
Pt to nursing station requesting to speak with Dahlia Client; informed patient Dahlia Client was not here. Pt wanting clothes and belongings. Informed patient of guidelines and IVC status. Pt verbal at times, then will mumble words that are incomprehensible as if talking to someone else. Attempted to assist pt back to room with sitter. Pt reluctant to go in room, would stand in hallway. Pt asking what day it is and how long he's been here. Informed pt of plan and length of stay. Security and [employer] in hallway. Redirected patient into room; sitting in chair; sitter given cards to attempt to play cards with patient.

## 2015-04-05 NOTE — ED Notes (Signed)
Drinks and snacks provided.

## 2015-04-05 NOTE — ED Notes (Signed)
Called to room by sitter for patient. Pt wanting to know why he's here; unable to recall events since being brought into the ED. Pt appears more pleasant and more willing to communicate. Informed patient of events leading up to grandmother bringing him to ED. Pt admits to being with a friend who recently moved back to area during those days he wasn't home. Pt unable to recall acting out behaviors requiring ativan and restraint use; is sitting at the bedside table in a chair, grinning and laughing with sitter. Informed pt of IVC status; pt reports understanding. Also asked pt about the incomprehensible speech and muttering; states "having comments no one should here." Pt polite and cooperative at this time; denies AH/VH

## 2015-04-05 NOTE — ED Notes (Signed)
Copy of IVC paperwork faxed to [facility] at [facility]

## 2015-04-06 NOTE — ED Notes (Signed)
Breakfast meal Tray delivered.  

## 2015-04-06 NOTE — ED Notes (Signed)
Pt standing in doorway, sitter at bedside.

## 2015-04-06 NOTE — Care Management (Signed)
Patient continues to await placement for inpatient psych facility. No further ED CM needs identified

## 2015-04-06 NOTE — Progress Notes (Addendum)
Patient on [facility] and [facility] wait-list both as of 6/24.  Referral was followed up at: [facility], per intake referral not found. Per Candice, no beds tonight. [facility] - per [facility] intake, "the intake coordinator will be back in the morning, she gets the faxes". [facility] - per intake, "re-fax it for tomorrow d/c." [facility] - per Jorja Loa, nurse is on the unit, tomorrow will have updates on referral.  Declined at: [facility] - due to no insurance, per [facility] - per Wandra Mannan, due to acuity [facility] - due to acuity , per Brynda Peon - due to acuity  At capacity: [facility]   CSW will continue to seek placement.  Melbourne Abts, LCSWA Disposition staff 04/06/2015 5:57 PM

## 2015-04-06 NOTE — Progress Notes (Signed)
Pt referral faxed to the following facilities who report they are accepting referrals or have bed availability:  [facility]  Declined at [facility] due to acuity.  Remains on [facility] watilist.  Will continue seeking placement.  Chad Cordial, LCSWA 04/06/2015 11:47 AM

## 2015-04-06 NOTE — ED Notes (Signed)
Pt family left.  Pt was appropriate during visit.  Family states his behaviors have improved and he appears to be "himself again."

## 2015-04-06 NOTE — Progress Notes (Signed)
CSW spoke with patient's mother in waiting room. CSW offfered emotional support, provided update on plan for patient. Mother requested early visit time, was told can visit at 1230. Gerrie Nordmann, LCSW [phone]

## 2015-04-06 NOTE — ED Notes (Signed)
Pt has family at bedside

## 2015-04-07 DIAGNOSIS — F29 Unspecified psychosis not due to a substance or known physiological condition: Secondary | ICD-10-CM | POA: Insufficient documentation

## 2015-04-07 DIAGNOSIS — F23 Brief psychotic disorder: Secondary | ICD-10-CM

## 2015-04-07 NOTE — ED Notes (Addendum)
Pt eating breakfast at bedside.  TTS called and will reassess this morning.

## 2015-04-07 NOTE — ED Notes (Signed)
TTS at bedside. 

## 2015-04-07 NOTE — ED Notes (Signed)
This RN spoke with pt mother and asked her if she feels like her son is better and safe to come home.  Mother stated that she thought her son was better and wanted him to come home.

## 2015-04-07 NOTE — Discharge Instructions (Signed)
Psychosis Psychosis refers to a severe lack of understanding with reality. During a psychotic episode, you are not able to think clearly. During a psychotic episode, your responses and emotions are inappropriate and do not coincide with what is actually happening. You often have false beliefs about what is happening or who you are (delusions), and you may see, hear, taste, smell, or feel things that are not present (hallucinations). Psychosis is usually a severe symptom of a very serious mental health (psychiatric) condition, but it can sometimes be the result of a medical condition. CAUSES   Psychiatric conditions, such as:  Schizophrenia.  Bipolar disorder.  Depression.  Personality disorders.  Alcohol or drug abuse.  Medical conditions, such as:  Brain injury.  Brain tumor.  Dementia.  Brain diseases, such as Alzheimer's, Parkinson's, or Huntington's disease.  Neurological diseases, such as epilepsy.  Genetic disorders.  Metabolic disorders.  Infections that affect the brain.  Certain prescription drugs.  Stroke. SYMPTOMS   Unable to think or speak clearly or respond appropriately.  Disorganized thinking (thoughts jump from one thought to another).  Severe inappropriate behavior.  Delusions may include:  A strong belief that is odd, unrealistic, or false.  Feeling extremely fearful or suspicious (paranoid).  Believing you are someone else, have high importance, or have an altered identity.  Hallucinations. DIAGNOSIS   Mental health evaluation.  Physical exam.  Blood tests.  Computerized magnetic scan (MRI) or other brain scans. TREATMENT  Your caregiver will recommend a course of treatment that depends on the cause of the psychosis. Treatment may include:  Monitoring and supportive care in the hospital.  Taking medicines (antipsychotic medicine) to reduce symptoms and balance chemicals in the brain.  Taking medicines to manage underlying  mental health conditions.  Therapy and other supportive programs outside of the hospital.  Treating an underlying medical condition. If the cause of the psychosis can be treated or corrected, the outlook is good. Without treatment, psychotic episodes can cause danger to yourself or others. Treatment may be short-term or lifelong. HOME CARE INSTRUCTIONS   Take all medicines as directed. This is important.  Use a pillbox or write down your medicine schedule to make sure you are taking them.  Check with your caregiver before using over-the-counter medicines, herbs, or supplements.  Seek individual and family support through therapy and mental health education (psychoeducation) programs. These will help you manage symptoms and side effects of medicines, learn life skills, and maintain a healthy routine.  Maintain a healthy lifestyle.  Exercise regularly.  Avoid alcohol and drugs.  Learn ways to reduce stress and cope with stress, such as yoga and meditation.  Talk about your feelings with family members or caregivers.  Make time for yourself to do things you enjoy.  Know the early warning signs of psychosis. Your caregiver will recommend steps to take when you notice symptoms such as:  Feeling anxious or preoccupied.  Having racing thoughts.  Changes in your interest in life and relationships.  Follow up with your caregivers for continued outpatient treatment as directed. SEEK MEDICAL CARE IF:   Medicines do not seem to be helping.  You hear voices telling you to do things.  You see, smell, or feel things that are not there.  You feel hopeless and overwhelmed.  You feel extremely fearful and suspicious that something will harm you.  You feel like you cannot leave your house.  You have trouble taking care of yourself.  You experience side effects of medicines, such as   changes in sleep patterns, dizziness, weight gain, restlessness, movement changes, muscle spasms, or  tremors. SEEK IMMEDIATE MEDICAL CARE IF:  Severe psychotic symptoms present a safety issue (such as an urge to hurt yourself or others). MAKE SURE YOU:   Understand these instructions.  Will watch your condition.  Will get help right away if you are not doing well or get worse. FOR MORE INFORMATION  National Institute of Mental Health: www.nimh.nih.gov Document Released: 03/19/2010 Document Revised: 12/22/2011 Document Reviewed: 03/19/2010 [employer] Patient Information 2015 [employer]. This information is not intended to replace advice given to you by your health care provider. Make sure you discuss any questions you have with your health care provider.  

## 2015-04-07 NOTE — ED Provider Notes (Signed)
Patient seen by TTS later today and is now appropriate. Family is at bedside and states he is back to baseline. IVC rescinded and patient discharged home.  Gwyneth Sprout, MD 04/07/15 [phone]

## 2015-04-07 NOTE — ED Notes (Signed)
Pt family at bedside

## 2015-04-07 NOTE — ED Notes (Signed)
Relieved sitter for lunch.

## 2015-04-07 NOTE — ED Notes (Signed)
Food that had been faxed @ 6:30 finally arrived @ 8:37. Called @8:20 for the second time to ask where the food was at and the lady on the line said the food was on the elevator up to our location @ 8:17.   

## 2015-04-07 NOTE — Consult Note (Signed)
Telepsych Consultation   Reason for Consult:  Psychosis Referring Physician:  EDP Patient Identification: Samuel Martin MRN:  [id number] Principal Diagnosis: Acute psychosis Diagnosis:   Patient Active Problem List   Diagnosis Date Noted  . 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 Marijuana abuse [F12.10] 04/03/2015    Priority: High  . Psychoses [F29]     Total Time spent with patient: 25 minutes  Subjective:   Samuel Martin is a 19 y.o. male patient admitted with reports of psychotic behavior and being combative with ED staff members upon arrival. Pt known to this NP from prior assessment. Pt was initially psychotic on 04/01/15 and was awaiting placement. Pt could barely participate in the assessment. Today, pt seen and chart reviewed. Pt has made dramatic progress and is no longer presenting as psychotic. He is calm, cooperative, alert/oriented x4, and answers questions appropriately. His family is visiting today and they report that this is his baseline and that they have no reservations about him coming home. Pt clearly denies suicidal/homicidal ideation which is congruent with ED staff objective observation. Pt also denies psychosis and does not appear to be responding to any internal stimuli. A review of notes supports this improvement and pt is stable for discharge with outpatient follow-up at this time.   HPI:   Samuel Martin is a 19 y.o. male presenting to [facility] with bizarre behavior and psychotic symptoms. Pt is accompanied by his aunt. Pt is combative upon arrival to ED and requires restraints and administration of Geodon at one point. He responds to internal stimuli, mumbles incoherently to himself, and provides irrelevant responses to questions asked by counselor and other medical staff in the ED. At other times, pt closes his eyes and refuses to answer questions altogether. Pt's mood alternates between angry and histrionic, becoming slightly tearful. Pt was very  guarded during TTS assessment and denied any sx of mood disorder. He also shook his head "no" when asked about A/VH but it was apparent that the pt had been responding to internal stimuli.  Counselor spoke with pt's grandmother privately prior to TTS assessment. Pt's grandmother states that she has never witnessed this type of behavior in the pt before and suspects that he "got a hold of some bad drugs". She states that pt disappeared from Wed - Fri and she has no idea where he was or what he was doing. Pt's grandmother states that pt has admitted to marijuana use before but no other substances. She reports that the pt has no prior hx of mental illness or psychiatric hospitalizations. Pt is staying with his grandmother only temporarily. He usually resides with his parents. Counselor unable to verify if pt's psychosis is substance-induced or if the pt is experiencing his first psychotic episode due to mental illness, as the pt has not provided a urine sample to medical staff as of 23:57 on 04/01/15.  Past Medical History: History reviewed. No pertinent past medical history. History reviewed. No pertinent past surgical history. Family History: No family history on file. Social History:  History  Alcohol Use No     History  Drug Use Not on file    History   Social History  . Marital Status: Single    Spouse Name: N/A  . Number of Children: N/A  . Years of Education: N/A   Social History Main Topics  . Smoking status: Never Smoker   . Smokeless tobacco: Not on file  . Alcohol Use: No  . Drug  Use: Not on file  . Sexual Activity: Not on file   Other Topics Concern  . None   Social History Narrative   Additional Social History:    Pain Medications: See PTA List Prescriptions: See PTA List Over the Counter: See PTA List History of alcohol / drug use?: Yes Longest period of sobriety (when/how long): UTA Negative Consequences of Use:  (UTA) Name of Substance 1: THC 1 - Age of First  Use: Unknown 1 - Amount (size/oz): Unknown 1 - Frequency: Occasional 1 - Duration: Unknown 1 - Last Use / Amount: Unknown; Pt's grandmother reports that pt has admitted to occasional marijuana use                   Allergies:   Allergies  Allergen Reactions  . Shellfish-Derived Products Hives and Rash    Labs:  No results found for this or any previous visit (from the past 48 hour(s)).  Vitals: Blood pressure 140/86, pulse 64, temperature 97.8 F (36.6 C), temperature source Oral, resp. rate 17, height 5\' 11"  (1.803 m), SpO2 100 %.  Risk to Self: Suicidal Ideation: No Suicidal Intent: No Is patient at risk for suicide?: No Suicidal Plan?: No Access to Means: No How many times?: 0 (per grandmother) Other Self Harm Risks: Wandering, substance use Intentional Self Injurious Behavior: None Risk to Others: Homicidal Ideation: No Thoughts of Harm to Others:  (UTA) Current Homicidal Intent: No Current Homicidal Plan: No Access to Homicidal Means: No History of harm to others?:  (UTA) Assessment of Violence: On admission Violent Behavior Description: Pt combative on admission, had to be put in restraints at one point Does patient have access to weapons?: No Criminal Charges Pending?:  (UTA) Does patient have a court date:  (UTA) Prior Inpatient Therapy: Prior Inpatient Therapy: No Prior Therapy Dates: na Prior Therapy Facilty/Provider(s): na Reason for Treatment: na Prior Outpatient Therapy: Prior Outpatient Therapy: No Prior Therapy Dates: na Prior Therapy Facilty/Provider(s): na Reason for Treatment: na Does patient have an ACCT team?: No Does patient have Intensive In-House Services?  : No Does patient have [facility] services? : No Does patient have P4CC services?: No  Current Facility-Administered Medications  Medication Dose Route Frequency Provider Last Rate Last Dose  . acetaminophen (TYLENOL) tablet 650 mg  650 mg Oral Q4H PRN Gilda Crease, MD       . ibuprofen (ADVIL,MOTRIN) tablet 600 mg  600 mg Oral Q8H PRN Gilda Crease, MD      . LORazepam (ATIVAN) tablet 1 mg  1 mg Oral Q8H PRN Gilda Crease, MD   1 mg at 04/03/15 0256  . ondansetron (ZOFRAN) tablet 4 mg  4 mg Oral Q8H PRN Gilda Crease, MD      . ziprasidone (GEODON) injection 10 mg  10 mg Intramuscular Q4H PRN Gilda Crease, MD   10 mg at 04/04/15 1359  . zolpidem (AMBIEN) tablet 5 mg  5 mg Oral QHS PRN Gilda Crease, MD   5 mg at 04/03/15 0256   No current outpatient prescriptions on file.    Musculoskeletal: UTO, camera   Physical Exam  Review of Systems  Psychiatric/Behavioral: Negative for suicidal ideas and hallucinations.       Pt agitated, inappropriate, labile  All other systems reviewed and are negative.   Blood pressure 140/86, pulse 64, temperature 97.8 F (36.6 C), temperature source Oral, resp. rate 17, height 5\' 11"  (1.803 m), SpO2 100 %.There is no weight on file  to calculate BMI.     General Appearance: Casual and Fairly Groomed  Eye Contact::  Good  Speech:  Clear and Coherent and Normal Rate  Volume:  Normal  Mood:  Anxious  Affect:  Appropriate, Congruent and Mildly anxious when talking about wanting to leave  Thought Process:  Coherent and Goal Directed  Orientation:  Full (Time, Place, and Person)  Thought Content:  WDL  Suicidal Thoughts:  No  Homicidal Thoughts:  No  Memory:  Immediate;   Fair Recent;   Fair Remote;   Fair  Judgement:  Fair  Insight:  Fair  Psychomotor Activity:  Normal  Concentration:  Good  Recall:  Good  Fund of Knowledge:  Good  Language:  Good  Akathisia:  No  Handed:    AIMS (if indicated):     Assets:  Communication Skills Desire for Improvement Resilience Social Support  ADL's:  Intact  Cognition:  WNL  Sleep:      Medical Decision Making: Established Problem, Stable/Improving (1), Review or order clinical lab tests (1), Review and summation of old records (2),  Review of Medication Regimen & Side Effects (2) and Review of New Medication or Change in Dosage (2)  Treatment Plan Summary: Acute psychosis, improving, now stable for discharge.  Disposition:  -Rescind IVC -Discharge home -[facility] TTS to assist with outpatient referrals for psychiatry and counseling  *Case reviewed with Dr. Cleda Daub, Everardo All, FNP-BC 04/07/2015 10:52 AM  Case and plan reviewed.

## 2015-04-28 ENCOUNTER — Emergency Department (HOSPITAL_COMMUNITY)
Admission: EM | Admit: 2015-04-28 | Discharge: 2015-05-01 | Disposition: A | Discharge status: Other Acute Inpatient Hospital | Payer: Federal, State, Local not specified - Other | Attending: Emergency Medicine | Admitting: Emergency Medicine

## 2015-04-28 ENCOUNTER — Encounter (HOSPITAL_COMMUNITY): Payer: Self-pay | Admitting: Emergency Medicine

## 2015-04-28 DIAGNOSIS — F23 Brief psychotic disorder: Secondary | ICD-10-CM | POA: Diagnosis present

## 2015-04-28 DIAGNOSIS — F29 Unspecified psychosis not due to a substance or known physiological condition: Secondary | ICD-10-CM | POA: Insufficient documentation

## 2015-04-28 HISTORY — DX: Thrombocytopenia, unspecified: D69.6

## 2015-04-28 LAB — CBC WITH DIFFERENTIAL/PLATELET
Basophils Absolute: 0 K/uL (ref 0.0–0.1)
Basophils Relative: 0 % (ref 0–1)
Eosinophils Absolute: 0.1 K/uL (ref 0.0–0.7)
Eosinophils Relative: 1 % (ref 0–5)
HCT: 49.5 % (ref 39.0–52.0)
Hemoglobin: 16.6 g/dL (ref 13.0–17.0)
Lymphocytes Relative: 35 % (ref 12–46)
Lymphs Abs: 2.3 K/uL (ref 0.7–4.0)
MCH: 29.7 pg (ref 26.0–34.0)
MCHC: 33.5 g/dL (ref 30.0–36.0)
MCV: 88.7 fL (ref 78.0–100.0)
Monocytes Absolute: 0.6 K/uL (ref 0.1–1.0)
Monocytes Relative: 9 % (ref 3–12)
Neutro Abs: 3.6 K/uL (ref 1.7–7.7)
Neutrophils Relative %: 55 % (ref 43–77)
Platelets: 102 K/uL — ABNORMAL LOW (ref 150–400)
RBC: 5.58 MIL/uL (ref 4.22–5.81)
RDW: 13.4 % (ref 11.5–15.5)
WBC: 6.5 K/uL (ref 4.0–10.5)

## 2015-04-28 LAB — PROTIME-INR
INR: 1.25 (ref 0.00–1.49)
Prothrombin Time: 15.9 s — ABNORMAL HIGH (ref 11.6–15.2)

## 2015-04-28 LAB — COMPREHENSIVE METABOLIC PANEL
ALT: 25 U/L (ref 17–63)
AST: 51 U/L — ABNORMAL HIGH (ref 15–41)
Albumin: 4.8 g/dL (ref 3.5–5.0)
Alkaline Phosphatase: 100 U/L (ref 38–126)
Anion gap: 10 (ref 5–15)
BUN: 18 mg/dL (ref 6–20)
CALCIUM: 10.2 mg/dL (ref 8.9–10.3)
CO2: 27 mmol/L (ref 22–32)
Chloride: 103 mmol/L (ref 101–111)
Creatinine, Ser: 1.48 mg/dL — ABNORMAL HIGH (ref 0.61–1.24)
GFR calc Af Amer: >60 mL/min (ref >60)
GFR calc non Af Amer: >60 mL/min (ref >60)
Glucose, Bld: 92 mg/dL (ref 65–99)
Potassium: 4.7 mmol/L (ref 3.5–5.1)
Sodium: 140 mmol/L (ref 135–145)
Total Bilirubin: 2.8 mg/dL — ABNORMAL HIGH (ref 0.3–1.2)
Total Protein: 7.8 g/dL (ref 6.5–8.1)

## 2015-04-28 LAB — ACETAMINOPHEN LEVEL: Acetaminophen (Tylenol), Serum: <10 ug/mL — ABNORMAL LOW (ref 10–30)

## 2015-04-28 LAB — ETHANOL: Alcohol, Ethyl (B): <5 mg/dL

## 2015-04-28 LAB — SALICYLATE LEVEL: Salicylate Lvl: <4 mg/dL (ref 2.8–30.0)

## 2015-04-28 MED ORDER — DIPHENHYDRAMINE HCL 50 MG/ML IJ SOLN: 50.0000 mg | Freq: Once | INTRAMUSCULAR | Status: DC

## 2015-04-28 MED ORDER — LORAZEPAM 1 MG PO TABS
1.0000 mg | ORAL_TABLET | Freq: Three times a day (TID) | ORAL | Status: DC | PRN
  Administered 2015-04-29: 1 mg via ORAL
  Filled 2015-04-28 (×2): qty 1

## 2015-04-28 MED ORDER — NICOTINE 21 MG/24HR TD PT24: 21.0000 mg | MEDICATED_PATCH | Freq: Every day | TRANSDERMAL | Status: DC | PRN

## 2015-04-28 MED ORDER — LORAZEPAM 1 MG PO TABS
2.0000 mg | ORAL_TABLET | ORAL | Status: AC | PRN
  Administered 2015-04-29: 1 mg via ORAL

## 2015-04-28 MED ORDER — OLANZAPINE 5 MG PO TBDP
5.0000 mg | ORAL_TABLET | Freq: Three times a day (TID) | ORAL | Status: DC | PRN
  Administered 2015-04-29: 5 mg via ORAL
  Filled 2015-04-28: qty 1

## 2015-04-28 MED ORDER — ZIPRASIDONE MESYLATE 20 MG IM SOLR
20.0000 mg | INTRAMUSCULAR | Status: AC | PRN
  Administered 2015-04-28: 20 mg via INTRAMUSCULAR
  Filled 2015-04-28: qty 20

## 2015-04-28 MED ORDER — IBUPROFEN 400 MG PO TABS: 400.0000 mg | ORAL_TABLET | Freq: Three times a day (TID) | ORAL | Status: DC | PRN

## 2015-04-28 MED ORDER — ACETAMINOPHEN 325 MG PO TABS: 650.0000 mg | ORAL_TABLET | ORAL | Status: DC | PRN

## 2015-04-28 NOTE — Progress Notes (Signed)
Patient ID: Samuel Martin, male   DOB: 01/24/1996, 19 y.o.   MRN: [id number] S- Contacted by TTS regarding pt acutely psychotic-?drug reaction Pt is incommunacado.In ED about 4 weks ago with similar episode.TTS contact ED MD regarding medicating pt and decision is to treat. O-See TTS assessment A- Acute psychosis -?etiology-regardless intervention indicated P-Agitation protocol + Benadryl 50 mg IM x1

## 2015-04-28 NOTE — ED Provider Notes (Signed)
CSN: [id number]     Arrival date & time 04/28/15  1913 History   First MD Initiated Contact with Patient 04/28/15 1916     Chief Complaint  Patient presents with  . Altered Mental Status      Patient is a 19 y.o. male presenting with altered mental status. The history is provided by the EMS personnel. The history is limited by the condition of the patient (Psychosis).  Altered Mental Status Pt was seen at 1930.  Per EMS, Police: Pt was having inappropriate behavior while at a convenience store and Police were called. Pt has hx of psychosis and was seen in the ED 2 to 3 weeks ago for similar symptoms.  Pt will not answer questions verbally. No family with pt.    Past Medical History  Diagnosis Date  . Thrombocytopenia    History reviewed. No pertinent past surgical history.  History  Substance Use Topics  . Smoking status: Never Smoker   . Smokeless tobacco: Not on file  . Alcohol Use: No    Review of Systems  Unable to perform ROS: Psychiatric disorder     Allergies  Shellfish-derived products  Home Medications   Prior to Admission medications   Not on File   BP 143/88 mmHg  Pulse 69  Temp(Src) 99 F (37.2 C) (Oral)  Resp 17  SpO2 99% Physical Exam  1935: Physical examination:  Nursing notes reviewed; Vital signs and O2 SAT reviewed;  Constitutional: Well developed, Well nourished, Well hydrated, In no acute distress; Head:  Normocephalic, atraumatic; Eyes: EOMI, PERRL, No scleral icterus; ENMT: Mouth and pharynx normal, Mucous membranes moist; Neck: Supple, Full range of motion; Cardiovascular: Regular rate and rhythm, No gallop; Respiratory: Breath sounds clear & equal bilaterally, No wheezes. Normal respiratory effort/excursion; Chest: No deformity, Movement normal; Abdomen: Soft, Nontender, Nondistended, Normal bowel sounds;; Extremities: Pulses normal, No tenderness, No edema, No calf edema or asymmetry.; Neuro: Awake, alert, eyes open spontaneously. Refuses to  speak with ED staff. Moves all extremities spontaneously.; Skin: Color normal, Warm, Dry.; Psych:  Appears to be responding to internal stimuli.    ED Course  Procedures      EKG Interpretation   Date/Time:  Saturday April 28 2015 19:25:18 EDT Ventricular Rate:  65 PR Interval:  181 QRS Duration: 96 QT Interval:  380 QTC Calculation: 395 R Axis:   95 Text Interpretation:  Sinus arrhythmia Borderline right axis deviation  Borderline Q waves in lateral leads ST elev, probable normal early repol  pattern Partial missing lead(s): V3 Artifact When compared with ECG of  04/01/2015 No significant change was found Confirmed by [facility]  MD,  Nicholos JohnsKATHLEEN [phone]) on 04/28/2015 7:33:44 PM      MDM  MDM Reviewed: previous chart, nursing note and vitals Reviewed previous: labs and ECG Interpretation: labs and ECG    Results for orders placed or performed during the hospital encounter of 04/28/15  Acetaminophen level  Result Value Ref Range   Acetaminophen (Tylenol), Serum <10 (L) 10 - 30 ug/mL  Comprehensive metabolic panel  Result Value Ref Range   Sodium 140 135 - 145 mmol/L   Potassium 4.7 3.5 - 5.1 mmol/L   Chloride 103 101 - 111 mmol/L   CO2 27 22 - 32 mmol/L   Glucose, Bld 92 65 - 99 mg/dL   BUN 18 6 - 20 mg/dL   Creatinine, Ser 6.211.48 (H) 0.61 - 1.24 mg/dL   Calcium 30.810.2 8.9 - 65.710.3 mg/dL   Total Protein 7.8 6.5 -  8.1 g/dL   Albumin 4.8 3.5 - 5.0 g/dL   AST 51 (H) 15 - 41 U/L   ALT 25 17 - 63 U/L   Alkaline Phosphatase 100 38 - 126 U/L   Total Bilirubin 2.8 (H) 0.3 - 1.2 mg/dL   GFR calc non Af Amer >60 >60 mL/min   GFR calc Af Amer >60 >60 mL/min   Anion gap 10 5 - 15  Ethanol  Result Value Ref Range   Alcohol, Ethyl (B) <5 <5 mg/dL  Salicylate level  Result Value Ref Range   Salicylate Lvl <4.0 2.8 - 30.0 mg/dL  CBC with Differential  Result Value Ref Range   WBC 6.5 4.0 - 10.5 K/uL   RBC 5.58 4.22 - 5.81 MIL/uL   Hemoglobin 16.6 13.0 - 17.0 g/dL   HCT 16.1 09.6  - 04.5 %   MCV 88.7 78.0 - 100.0 fL   MCH 29.7 26.0 - 34.0 pg   MCHC 33.5 30.0 - 36.0 g/dL   RDW 40.9 81.1 - 91.4 %   Platelets 102 (L) 150 - 400 K/uL   Neutrophils Relative % 55 43 - 77 %   Neutro Abs 3.6 1.7 - 7.7 K/uL   Lymphocytes Relative 35 12 - 46 %   Lymphs Abs 2.3 0.7 - 4.0 K/uL   Monocytes Relative 9 3 - 12 %   Monocytes Absolute 0.6 0.1 - 1.0 K/uL   Eosinophils Relative 1 0 - 5 %   Eosinophils Absolute 0.1 0.0 - 0.7 K/uL   Basophils Relative 0 0 - 1 %   Basophils Absolute 0.0 0.0 - 0.1 K/uL  Protime-INR  Result Value Ref Range   Prothrombin Time 15.9 (H) 11.6 - 15.2 seconds   INR 1.25 0.00 - 1.49    2050:  Pt evaluated in the ED 2 to 3 weeks ago for psychotic behavior and discharged. No family at bedside. Pt appears to be responding to internal stimuli. TTS has evaluated pt: recommends inpt treatment. IVC paperwork completed, as pt is starting to periodically yell out from his room to the hallway, curse, and become agitated. Holding orders written.     Samuel Jester, DO 04/28/15 2257

## 2015-04-28 NOTE — BH Assessment (Addendum)
Tele Assessment Note   Samuel Martin is an 19 y.o. male, single, black who presents unaccompanied to [facility] ED by Patent examiner. Per EMS, Pt was causing a disturbance at a convenience store and police were called. Pt is currently agitated and appears to be responding to internal stimuli. He is talking to people who are not in the room and appears to be having conversations with himself. He is laughing at times, agitated and angry at times. At times he was making rude gestures. Pt was unable to respond appropriately to any questions and complete clinical assessment was unable to be obtained at this time.  Pt is dressed in hospital scrubs, alert, disoriented with rambling speech and restless motor behavior. Eye contact is poor. Pt's mood is angry and irritable and affect is labile. Thought process is disorganized.   Pt was at [facility] ED from 04/01/15-04/06/14 due to similar symptoms. Attached is information from previous encounter provided by psychiatry, Nani Skillern, NP on 04/07/15:  Subjective:  Samuel Martin is a 19 y.o. male patient admitted with reports of psychotic behavior and being combative with ED staff members upon arrival. Pt known to this NP from prior assessment. Pt was initially psychotic on 04/01/15 and was awaiting placement. Pt could barely participate in the assessment. Today, pt seen and chart reviewed. Pt has made dramatic progress and is no longer presenting as psychotic. He is calm, cooperative, alert/oriented x4, and answers questions appropriately. His family is visiting today and they report that this is his baseline and that they have no reservations about him coming home. Pt clearly denies suicidal/homicidal ideation which is congruent with ED staff objective observation. Pt also denies psychosis and does not appear to be responding to any internal stimuli. A review of notes supports this improvement and pt is stable for discharge with outpatient follow-up at  this time.   HPI:  Samuel Martin is a 19 y.o. male presenting to [facility] with bizarre behavior and psychotic symptoms. Pt is accompanied by his aunt. Pt is combative upon arrival to ED and requires restraints and administration of Geodon at one point. He responds to internal stimuli, mumbles incoherently to himself, and provides irrelevant responses to questions asked by counselor and other medical staff in the ED. At other times, pt closes his eyes and refuses to answer questions altogether. Pt's mood alternates between angry and histrionic, becoming slightly tearful. Pt was very guarded during TTS assessment and denied any sx of mood disorder. He also shook his head "no" when asked about A/VH but it was apparent that the pt had been responding to internal stimuli.  Counselor spoke with pt's grandmother privately prior to TTS assessment. Pt's grandmother states that she has never witnessed this type of behavior in the pt before and suspects that he "got a hold of some bad drugs". She states that pt disappeared from Wed - Fri and she has no idea where he was or what he was doing. Pt's grandmother states that pt has admitted to marijuana use before but no other substances. She reports that the pt has no prior hx of mental illness or psychiatric hospitalizations. Pt is staying with his grandmother only temporarily. He usually resides with his parents. Counselor unable to verify if pt's psychosis is substance-induced or if the pt is experiencing his first psychotic episode due to mental illness, as the pt has not provided a urine sample to medical staff as of 23:57 on 04/01/15.    Axis I: Unspecified Psychotic  Disorder Axis II: Deferred Axis III:  Past Medical History  Diagnosis Date  . Thrombocytopenia    Axis IV: problems with access to health care services Axis V: GAF=20  Past Medical History:  Past Medical History  Diagnosis Date  . Thrombocytopenia     History reviewed. No pertinent past  surgical history.  Family History: History reviewed. No pertinent family history.  Social History:  reports that he has never smoked. He does not have any smokeless tobacco history on file. He reports that he does not drink alcohol. His drug history is not on file.  Additional Social History:  Alcohol / Drug Use Pain Medications: See PTA List Prescriptions: See PTA List Over the Counter: See PTA List History of alcohol / drug use?: Yes Longest period of sobriety (when/how long): Unknown Substance #1 Name of Substance 1: THC 1 - Age of First Use: Unknown 1 - Amount (size/oz): Unknown 1 - Frequency: Unknown 1 - Duration: Unknown 1 - Last Use / Amount: Unknown  CIWA: CIWA-Ar BP: 143/88 mmHg Pulse Rate: 69 COWS:    PATIENT STRENGTHS: (choose at least two) Average or above average intelligence Physical Health Supportive family/friends  Allergies:  Allergies  Allergen Reactions  . Shellfish-Derived Products Hives and Rash    Home Medications:  (Not in a hospital admission)  OB/GYN Status:  No LMP for male patient.  General Assessment Data [facility] TTS Assessment: In system Is this a Tele or Face-to-Face Assessment?: Tele Assessment Is this an Initial Assessment or a Re-assessment for this encounter?: Initial Assessment Marital status: Single Maiden name: NA Is patient pregnant?: No Pregnancy Status: No Living Arrangements: Parent Can pt return to current living arrangement?: Yes Admission Status: Voluntary Is patient capable of signing voluntary admission?: Yes Referral Source:  Mudlogger(Law enforcement) Insurance type: Medicaid     Crisis Care Plan Living Arrangements: Parent Name of Psychiatrist: None Name of Therapist: None  Education Status Is patient currently in school?: No Current Grade: NA Highest grade of school patient has completed: NA Name of school: NA Contact person: NA  Risk to self with the past 6 months Suicidal Ideation:  No (Pt disorganized and unable to answer questions) Has patient been a risk to self within the past 6 months prior to admission? : No (Pt disorganized and unable to answer questions) Suicidal Intent: No (Pt disorganized and unable to answer questions) Has patient had any suicidal intent within the past 6 months prior to admission? : No (Pt disorganized and unable to answer questions) Is patient at risk for suicide?: No (Pt disorganized and unable to answer questions) Suicidal Plan?: No (Pt disorganized and unable to answer questions) Has patient had any suicidal plan within the past 6 months prior to admission? : No (Pt disorganized and unable to answer questions) Access to Means: No (Pt disorganized and unable to answer questions) What has been your use of drugs/alcohol within the last 12 months?: Pt has history of using marijuana. 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 : Unknown Thoughts of Harm to Others: No (Pt disorganized and unable to answer questions) Current Homicidal Intent: No (Pt disorganized and unable to answer questions) Current Homicidal Plan: No (Pt disorganized and unable to answer questions) Access to Homicidal Means: No (Pt  disorganized and unable to answer questions) Identified Victim: None identified History of harm to others?: No (Pt disorganized and unable to answer questions) Assessment of Violence: In past 6-12 months Violent Behavior Description: Pt was combative on previous admission to ED Does patient have access to weapons?: No (Pt disorganized and unable to answer questions) Criminal Charges Pending?: No Does patient have a court date: No Is patient on probation?: Unknown  Psychosis Hallucinations: Auditory (Pt appears to be responding to internal stimuli) Delusions: Unspecified  Mental Status Report Appearance/Hygiene: In scrubs Eye Contact: Poor Motor Activity: Freedom of movement, Gestures Speech: Incoherent Level of Consciousness: Alert, Irritable Mood: Angry Affect: Irritable Anxiety Level: None Thought Processes: Irrelevant Judgement: Impaired Orientation: Not oriented Obsessive Compulsive Thoughts/Behaviors: None  Cognitive Functioning Concentration: Poor Memory: Unable to Assess IQ: Average Insight: Poor Impulse Control: Poor Appetite: Fair (Pt disorganized and unable to answer questions) Weight Loss: 0 (Pt disorganized and unable to answer questions) Weight Gain: 0 (Pt disorganized and unable to answer questions) Sleep: Unable to Assess Total Hours of Sleep: 0 (Pt disorganized and unable to answer questions) Vegetative Symptoms: Unable to Assess  ADLScreening [facility] Assessment Services) Patient's cognitive ability adequate to safely complete daily activities?: Yes Patient able to express need for assistance with ADLs?: Yes Independently performs ADLs?: Yes (appropriate for developmental age)  Prior Inpatient Therapy Prior Inpatient Therapy: No Prior Therapy Dates: NA Prior Therapy Facilty/Provider(s): NA Reason for Treatment: NA  Prior Outpatient Therapy Prior Outpatient Therapy: No Prior Therapy Dates: NA Prior Therapy Facilty/Provider(s): NA Reason for  Treatment: NA Does patient have an ACCT team?: No Does patient have Intensive In-House Services?  : No Does patient have [facility] services? : No Does patient have P4CC services?: No  ADL Screening (condition at time of admission) Patient's cognitive ability adequate to safely complete daily activities?: Yes Is the patient deaf or have difficulty hearing?: No Does the patient have difficulty seeing, even when wearing glasses/contacts?: No Does the patient have difficulty concentrating, remembering, or making decisions?: No Patient able to express need for assistance with ADLs?: Yes Does the patient have difficulty dressing or bathing?: No Independently performs ADLs?: Yes (appropriate for developmental age) Does the patient have difficulty walking or climbing stairs?: No Weakness of Legs: None Weakness of Arms/Hands: None       Abuse/Neglect Assessment (Assessment to be complete while patient is alone) Physical Abuse: Denies Verbal Abuse: Denies Sexual Abuse: Denies Exploitation of patient/patient's resources: Denies Self-Neglect: Denies     Merchant navy officer (For Healthcare) Does patient have an advance directive?: No Would patient like information on creating an advanced directive?: No - patient declined information    Additional Information 1:1 In Past 12 Months?: Yes CIRT Risk: Yes Elopement Risk: Yes Does patient have medical clearance?: No     Disposition: Berneice Heinrich, AC at [facility], confirmed adult unit is currently at capacity. Gave clinical report to Maryjean Morn, PA-C who recommends Pt be observed in ED and said he will submit orders for medication. Spoke to Dr. Samuel Jester who said she would initiate IVC. Currently there is not enough clinical information regarding labs  and clinical mental health assessment for outside psychiatric facilities to consider Pt for admission. Pt will be evaluated by psychiatry tomorrow.  Disposition Initial Assessment Completed  for this Encounter: Yes Disposition of Patient: Other dispositions Other disposition(s): Other (Comment)   Pamalee Leyden, [facility], [facility], [facility] Triage Specialist [phone]   Pamalee Leyden 04/28/2015 8:06 PM

## 2015-04-28 NOTE — ED Notes (Signed)
Per EMS - pt causing issues at convenience store, police called. Pt not responding to questions and having inappropriate behavior - EMS was called. Pt reports having smoked dope today. Answers by nodding or shaking head. Previous incident and kept in hospital 4-5 days for similar issue. VSS. No hx. Ambulatory w/ steady gait

## 2015-04-28 NOTE — ED Notes (Signed)
Per NT/EMT - pt threw urinal across room when handed urinal for UDS. EDP aware and okay with waiting until pt calms down to get urine specimen.

## 2015-04-29 LAB — RAPID URINE DRUG SCREEN, HOSP PERFORMED
Amphetamines: NOT DETECTED
BARBITURATES: NOT DETECTED
Benzodiazepines: NOT DETECTED
Cocaine: NOT DETECTED
Opiates: NOT DETECTED
Tetrahydrocannabinol: POSITIVE — AB

## 2015-04-29 MED ORDER — HALOPERIDOL LACTATE 5 MG/ML IJ SOLN
5.0000 mg | Freq: Once | INTRAMUSCULAR | Status: AC
  Administered 2015-04-29: 5 mg via INTRAMUSCULAR
  Filled 2015-04-29: qty 1

## 2015-04-29 NOTE — Progress Notes (Signed)
Referred to the following psychiatric facilities: [facility]ulla [facility]- per Delice Bisonara (no beds currently but will hold referral for review when beds open) [facility] per Alecia [facility] (same as above) [facility]- per Sue LushAndrea (same as above)  All other facilities contacted [employer]([facility], [facility], [facility], [facility], [facility], [facility]) are at capacity. Left voicemail with Turner DanielsRowan and will refer if there is availability.  Ilean SkillMeghan Panayiota Larkin, MSW, LCSWA Clinical Social Work, Disposition  04/29/2015 [phone]

## 2015-04-29 NOTE — ED Notes (Signed)
MAGISTRATE ADVISED COMPLETING IVC PAPERS NOW AND WILL BE CALLING [employer] SHORTLY TO REQUEST THEM TO BE SERVED. OFF-DUTY [employer] OFFICERS AWARE.

## 2015-04-29 NOTE — ED Notes (Signed)
Pt sat on bed while Haldol injection was administered - tolerated well. Pt moved to exam room C25 and given Haldol d/t pt standing in doorway of room making inappropriate gestures at visitors and mumbling unintelligible words. Pt flexing his chest in a threatening manner. Pt returns to room after much encouragement. Pt ate snacks, sandwich and Sprite Zero given as requested.

## 2015-04-29 NOTE — ED Notes (Signed)
Advised Dr Norman HerrlichSteinl's of pt's inappropriate, bizarre behavior. Pt refusing to go back to room when asked then finally doing as asked. Pt then dancing in room inappropriately. Pt pointing his middle finger to Off-Duty [employer] Officers and security when they are not looking.

## 2015-04-29 NOTE — Consult Note (Signed)
Telepsych Consultation   Reason for Consult:  Psychosis and Bizarre behavior Referring Physician: EDP Patient Identification: Samuel Martin MRN:  [id number] Principal Diagnosis: Acute Psychosis Diagnosis:   Patient Active Problem List   Diagnosis Date Noted  . 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 Acute psychosis [F29] 04/03/2015  . Marijuana abuse [F12.10] 04/03/2015    Total Time spent with patient: 1 hour  Subjective:    Samuel Martin is an 19 y.o. male, single, black who presents accompanied to [facility] ED by Event organiser. Per EMS, Pt was causing a disturbance at a convenience store and police were called. Patient seen and chart reviewed. Chart review indicate that upon arrival to ED PT was  agitated and appears to be responding to internal stimuli. Hewas talking to people who are not in the room and appears to be having conversations with himself. He was laughing at times, agitated and angry at times. At times he was making rude gestures.   Patient  awakened from sleep intermittently during assessment .  Pt reports  he is at the [facility] and  his grand mother brought him in. Pt appears to understand questions and responding to the questions appropriately. Pt is alert, calm, cooperative, oriented to place.  He reports today's date as "twenty something" but smiles saying not sure of the Martin because he does not have his phone. Pt asks to use the bathroom following assessment,  asked to wait for the nurse and states, "so I can't go to the bathroom without someone" but waits till the nurse came to the room.   Pt denies suicidal/homicidal ideations and denies Auditory or Visual hallucination. It does not appear that pt is responding to any internal stimuli at this time.  Pt reports he smokes "weed" and would like to smoke it every Martin.  He was not able to say why he came to hospital, he smiles and says " lady I don't know".  Pt is positive for marijuana.   HPI: Samuel Martin is a 35 year y.o.   male presenting to [facility] ED accompanied by law enforcement. Per EMS, Pt was causing a disturbance at a convenience store and police were called. Pt is psychotic upon arrival and unable to answer questions.  He later became agitated, making gang signs and flailing, laughing inappropriately, and demonstrating other bizarre behaviors.  His mother was contacted and reports Pt not sleeping, talking to himself and wandering off since Father's Martin.  Pt was admitted on 04/02/2015 and stayed in the hospital for 4-5 days for smilar issue; sudden on set of psychotic symptoms and bizarre  behaviors.  During that admission, Pt was combative upon arrival to ED and requires restraints and administration of Geodon at one point.  He responded to internal stimuli, mumbles incoherently to himself, and provides irrelevant responses to questions asked by counselor and other medical staff in the ED.  Pt's mood alternates between angry and histrionic, becoming slightly tearful.     According to chart, Pt's grandmother denies noting previous incidents of psychotic and bizarre behavior by Pt and suspects  Pt  May have 'gotten hold of some bad drugs".  Pt's grandmother states that pt has admitted to Marijuana use before but no other substances.    She reports that the Pt has no prior hx of mental illness or psychiatric hospitalization.  The counselor was unable to verify if pt's psychosis is substance-induced or if the pt is experiencing his first psychotic episode due to mental illness.  HPI Elements:   Location:  Psychosis.                              Quality: Acute psychosis, uses marijuana                             Severity: Sudden psychotic symptoms                             Timing: Acute, 2nd recorded episode of psychosis                             Duration: Previous occurrence 04/03/15, lasted 4-5 days, 2nd occurrence 7/16                             Context: Unclear, grandmother suspects bad marijuana, but then  occurred again  Past Medical History:  Past Medical History  Diagnosis Date  . Thrombocytopenia    History reviewed. No pertinent past surgical history. Family History: History reviewed. No pertinent family history. Social History:  History  Alcohol Use No     History  Drug Use Not on file    History   Social History  . Marital Status: Single    Spouse Name: N/A  . Number of Children: N/A  . Years of Education: N/A   Social History Main Topics  . Smoking status: Never Smoker   . Smokeless tobacco: Not on file  . Alcohol Use: No  . Drug Use: Not on file  . Sexual Activity: Not on file   Other Topics Concern  . None   Social History Narrative   Additional Social History:    Pain Medications: See PTA List Prescriptions: See PTA List Over the Counter: See PTA List History of alcohol / drug use?: Yes Longest period of sobriety (when/how long): Unknown Name of Substance 1: THC 1 - Age of First Use: Unknown 1 - Amount (size/oz): Unknown 1 - Frequency: Unknown 1 - Duration: Unknown 1 - Last Use / Amount: Unknown                   Allergies:   Allergies  Allergen Reactions  . Shellfish-Derived Products Hives and Rash    Labs:  Results for orders placed or performed during the hospital encounter of 04/28/15 (from the past 48 hour(s))  Acetaminophen level     Status: Abnormal   Collection Time: 04/28/15  7:30 PM  Result Value Ref Range   Acetaminophen (Tylenol), Serum <10 (L) 10 - 30 ug/mL    Comment:        THERAPEUTIC CONCENTRATIONS VARY SIGNIFICANTLY. A RANGE OF 10-30 ug/mL MAY BE AN EFFECTIVE CONCENTRATION FOR MANY PATIENTS. HOWEVER, SOME ARE BEST TREATED AT CONCENTRATIONS OUTSIDE THIS RANGE. ACETAMINOPHEN CONCENTRATIONS >150 ug/mL AT 4 HOURS AFTER INGESTION AND >50 ug/mL AT 12 HOURS AFTER INGESTION ARE OFTEN ASSOCIATED WITH TOXIC REACTIONS.   Comprehensive metabolic panel     Status: Abnormal   Collection Time: 04/28/15  7:30 PM  Result  Value Ref Range   Sodium 140 135 - 145 mmol/L   Potassium 4.7 3.5 - 5.1 mmol/L   Chloride 103 101 - 111 mmol/L   CO2 27 22 - 32 mmol/L   Glucose, Bld 92 65 -  99 mg/dL   BUN 18 6 - 20 mg/dL   Creatinine, Ser 1.48 (H) 0.61 - 1.24 mg/dL   Calcium 10.2 8.9 - 10.3 mg/dL   Total Protein 7.8 6.5 - 8.1 g/dL   Albumin 4.8 3.5 - 5.0 g/dL   AST 51 (H) 15 - 41 U/L   ALT 25 17 - 63 U/L   Alkaline Phosphatase 100 38 - 126 U/L   Total Bilirubin 2.8 (H) 0.3 - 1.2 mg/dL   GFR calc non Af Amer >60 >60 mL/min   GFR calc Af Amer >60 >60 mL/min    Comment: (NOTE) The eGFR has been calculated using the CKD EPI equation. This calculation has not been validated in all clinical situations. eGFR's persistently <60 mL/min signify possible Chronic Kidney Disease.    Anion gap 10 5 - 15  Ethanol     Status: None   Collection Time: 04/28/15  7:30 PM  Result Value Ref Range   Alcohol, Ethyl (B) <5 <5 mg/dL    Comment:        LOWEST DETECTABLE LIMIT FOR SERUM ALCOHOL IS 5 mg/dL FOR MEDICAL PURPOSES ONLY   Salicylate level     Status: None   Collection Time: 04/28/15  7:30 PM  Result Value Ref Range   Salicylate Lvl <4.8 2.8 - 30.0 mg/dL  CBC with Differential     Status: Abnormal   Collection Time: 04/28/15  7:30 PM  Result Value Ref Range   WBC 6.5 4.0 - 10.5 K/uL   RBC 5.58 4.22 - 5.81 MIL/uL   Hemoglobin 16.6 13.0 - 17.0 g/dL   HCT 49.5 39.0 - 52.0 %   MCV 88.7 78.0 - 100.0 fL   MCH 29.7 26.0 - 34.0 pg   MCHC 33.5 30.0 - 36.0 g/dL   RDW 13.4 11.5 - 15.5 %   Platelets 102 (L) 150 - 400 K/uL    Comment: SPECIMEN CHECKED FOR CLOTS REPEATED TO VERIFY PLATELET COUNT CONFIRMED BY SMEAR    Neutrophils Relative % 55 43 - 77 %   Neutro Abs 3.6 1.7 - 7.7 K/uL   Lymphocytes Relative 35 12 - 46 %   Lymphs Abs 2.3 0.7 - 4.0 K/uL   Monocytes Relative 9 3 - 12 %   Monocytes Absolute 0.6 0.1 - 1.0 K/uL   Eosinophils Relative 1 0 - 5 %   Eosinophils Absolute 0.1 0.0 - 0.7 K/uL   Basophils Relative 0 0  - 1 %   Basophils Absolute 0.0 0.0 - 0.1 K/uL  Protime-INR     Status: Abnormal   Collection Time: 04/28/15  7:30 PM  Result Value Ref Range   Prothrombin Time 15.9 (H) 11.6 - 15.2 seconds   INR 1.25 0.00 - 1.49  Urine rapid drug screen (hosp performed)     Status: Abnormal   Collection Time: 04/29/15 12:20 AM  Result Value Ref Range   Opiates NONE DETECTED NONE DETECTED   Cocaine NONE DETECTED NONE DETECTED   Benzodiazepines NONE DETECTED NONE DETECTED   Amphetamines NONE DETECTED NONE DETECTED   Tetrahydrocannabinol POSITIVE (A) NONE DETECTED   Barbiturates NONE DETECTED NONE DETECTED    Comment:        DRUG SCREEN FOR MEDICAL PURPOSES ONLY.  IF CONFIRMATION IS NEEDED FOR ANY PURPOSE, NOTIFY LAB WITHIN 5 DAYS.        LOWEST DETECTABLE LIMITS FOR URINE DRUG SCREEN Drug Class       Cutoff (ng/mL) Amphetamine      1000 Barbiturate  200 Benzodiazepine   423 Tricyclics       536 Opiates          300 Cocaine          300 THC              50     Vitals: Blood pressure 131/71, pulse 55, temperature 97.7 F (36.5 C), temperature source Oral, resp. rate 20, SpO2 100 %.  Risk to Self: Suicidal Ideation: No (Pt disorganized and unable to answer questions) Suicidal Intent: No (Pt disorganized and unable to answer questions) Is patient at risk for suicide?: No (Pt disorganized and unable to answer questions) Suicidal Plan?: No (Pt disorganized and unable to answer questions) Access to Means: No (Pt disorganized and unable to answer questions) What has been your use of drugs/alcohol within the last 12 months?: Pt has history of using marijuana. UDS pending, How many times?: 0 Other Self Harm Risks: Pt appears to be responding to internal stimuli Triggers for Past Attempts: None known Intentional Self Injurious Behavior: None Risk to Others: Homicidal Ideation: No (Pt disorganized and unable to answer questions) Thoughts of Harm to Others: No (Pt disorganized and unable to  answer questions) Current Homicidal Intent: No (Pt disorganized and unable to answer questions) Current Homicidal Plan: No (Pt disorganized and unable to answer questions) Access to Homicidal Means: No (Pt disorganized and unable to answer questions) Identified Victim: None identified History of harm to others?: No (Pt disorganized and unable to answer questions) Assessment of Violence: In past 6-12 months Violent Behavior Description: Pt was combative on previous admission to ED Does patient have access to weapons?: No (Pt disorganized and unable to answer questions) Criminal Charges Pending?: No Does patient have a court date: No Prior Inpatient Therapy: Prior Inpatient Therapy: No Prior Therapy Dates: NA Prior Therapy Facilty/Provider(s): NA Reason for Treatment: NA Prior Outpatient Therapy: Prior Outpatient Therapy: No Prior Therapy Dates: NA Prior Therapy Facilty/Provider(s): NA Reason for Treatment: NA Does patient have an ACCT team?: No Does patient have Intensive In-House Services?  : No Does patient have [facility] services? : No Does patient have [facility] services?: No  Current Facility-Administered Medications  Medication Dose Route Frequency Provider Last Rate Last Dose  . acetaminophen (TYLENOL) tablet 650 mg  650 mg Oral Q4H PRN Francine Graven, DO      . diphenhydrAMINE (BENADRYL) injection 50 mg  50 mg Intramuscular Once Dara Hoyer, PA-C   50 mg at 04/29/15 0456  . ibuprofen (ADVIL,MOTRIN) tablet 400 mg  400 mg Oral Q8H PRN Francine Graven, DO      . LORazepam (ATIVAN) tablet 1 mg  1 mg Oral Q8H PRN Francine Graven, DO   1 mg at 04/29/15 0723  . nicotine (NICODERM CQ - dosed in mg/24 hours) patch 21 mg  21 mg Transdermal Daily PRN Francine Graven, DO      . OLANZapine zydis (ZYPREXA) disintegrating tablet 5 mg  5 mg Oral Q8H PRN Dara Hoyer, PA-C   5 mg at 04/29/15 1443   No current outpatient prescriptions on file.    Musculoskeletal: Strength & Muscle  Tone: within normal limits Gait & Station: normal Patient leans: N/A  Psychiatric Specialty Exam: Physical Exam  Review of Systems  Constitutional: Negative.   HENT: Negative.   Eyes: Negative.   Respiratory: Negative.   Cardiovascular: Negative.   Gastrointestinal: Negative.   Genitourinary: Negative.   Musculoskeletal: Negative.   Skin: Negative.   Neurological: Negative.   Endo/Heme/Allergies: Negative.  Psychiatric/Behavioral: Positive for suicidal ideas (Denies Suiciald ideas), hallucinations (Denies hearing voices or seeing things) and substance abuse (Pt admits to marijuana use).    Blood pressure 131/71, pulse 55, temperature 97.7 F (36.5 C), temperature source Oral, resp. rate 20, SpO2 100 %.There is no weight on file to calculate BMI.  General Appearance: In bed wearing hospital scrub  Eye Contact::  Fair, sleeping,  woken up intermittently to answer questions  Speech:  Clear and Coherent  Volume:  Normal  Mood:  Euthymic  Affect:  Full Range  Thought Process:  Linear and Logical  Orientation:  Other:  place  Thought Content:  WDL  Suicidal Thoughts:  Denies Suicidal thoughts  Homicidal Thoughts:  Denies homicidal thoughts  Memory: Immediate memory, unable to assess remote  Judgement:  Poor  Insight:  Shallow  Psychomotor Activity:  Normal  Concentration:  Fair  Recall:  [employer] of Knowledge:Fair  Language: Good  Akathisia:  No  Handed:  Right  AIMS (if indicated):     Assets:  Armed forces logistics/support/administrative officer Physical Health Social Support  ADL's:  Intact  Cognition: Impaired,  Mild  Sleep:      Medical Decision Making: Review or order clinical lab tests (1), Established Problem, Worsening (2), Review of Medication Regimen & Side Effects (2) and Review of New Medication or Change in Dosage (2)   Treatment Plan Summary: Daily contact with patient to assess and evaluate symptoms and progress in treatment and Medication management  Plan:  Recommend  psychiatric Inpatient admission when medically cleared. Disposition:   - Inpatient psychiatric hospitalization for evaluation of pscyhiotic episodes, safety and stabilization   Samantha Crimes, PMHNP-BC 04/29/2015 11:55 PM

## 2015-04-29 NOTE — ED Notes (Signed)
Pt with TTS 

## 2015-04-29 NOTE — ED Notes (Signed)
Dr Steinl in w/pt. 

## 2015-04-29 NOTE — ED Notes (Signed)
Pt stated he wanted to take a shower.  Went to shower room and ran the water, but never actually got wet.  Pt changed scrub top, turned water off, and went back into room.

## 2015-04-29 NOTE — ED Provider Notes (Signed)
Patient wandering about ED, following commands poorly.   Periods of agitated behavior, at times w threatening posture.  Appears to responding to internal stimuli, ?voices in head.  Walking out of room, down hallways, into other rooms.   It appears bhh team has not placed on any new and/or scheduled psych meds for psychosis. For current symptoms, haldol im.   Psych team re-consulted to reassess pt, medication management and placement.   Filed Vitals:   04/29/15 0614  BP: 143/91  Pulse: 75  Temp: 98.6 F (37 C)  Resp: 20     Cathren LaineKevin Torrence Branagan, MD 04/29/15 [phone]

## 2015-04-29 NOTE — ED Notes (Signed)
[facility] papers being served. 

## 2015-04-29 NOTE — ED Notes (Signed)
PT HAD TO BE ENCOURAGED BY SITTER TO KEEP AMBULATING IN BATHROOM D/T PT HAD STOPPED AT EXAM ROOM PRIOR AND STARED AT THIS PT'S SITTER. PT CONTINUED TO AMBULATE PAST HIM ROOM ON WAY BACK FROM BATHROOM. SECURITY STOOD BY AND ASSISTED W/ENCOURAGING PT TO RETURN TO HIS ROOM. ATIVAN  GIVEN AS ORDERED. PT CONTINUES W/MUMBLING. PT NOT ANSWERING QUESTIONS. PT WILL PERFORM EYE CONTACT AND SMILE AT RN, HOWEVER, WILL NOT SPEAK. PT NOTED TO BE INTERMITTENTLY MUMBLING LOUDLY TO THE TV AND DANCING WHILE SITTING ON BED.

## 2015-04-29 NOTE — ED Notes (Signed)
Pt placed in burgundy scrubs  

## 2015-04-29 NOTE — ED Notes (Signed)
Pt's mother, Hardie PulleyLatisha Canan, called checking on pt's status. Advised her continuing to seek placement. Voiced understanding. (Pt had given verbal permission earlier to speak w/his mother.)

## 2015-04-29 NOTE — ED Notes (Signed)
Pt actively involved in television show, freedom of movement in upper extremities, making gang signs and flailing. Pt laughing and making word sounds through closed lips.

## 2015-04-29 NOTE — ED Notes (Signed)
SPOKE W/PT'S MOTHER Debbe Odea- LATISHA Bass [phone]- [phone] - PT ADVISED OK TO GIVE HER INFO. SHE ADVISED PT HAS BEEN NOT SLEEPING, TALKING TO HIMSELF AND WANDERING OFF SINCE FATHER'S DAY. STATES DOES NOT HAVE PSYCHIATRIST OR THERAPIST.

## 2015-04-30 ENCOUNTER — Emergency Department (HOSPITAL_COMMUNITY)
Admission: EM | Admit: 2015-04-30 | Discharge: 2015-04-30 | Discharge status: Absent without leave | Payer: Medicaid Other

## 2015-04-30 DIAGNOSIS — F29 Unspecified psychosis not due to a substance or known physiological condition: Secondary | ICD-10-CM | POA: Diagnosis not present

## 2015-04-30 MED ORDER — HALOPERIDOL 5 MG PO TABS
5.0000 mg | ORAL_TABLET | Freq: Once | ORAL | Status: AC
  Administered 2015-04-30: 5 mg via ORAL
  Filled 2015-04-30: qty 1

## 2015-04-30 MED ORDER — DIPHENHYDRAMINE HCL 25 MG PO CAPS
25.0000 mg | ORAL_CAPSULE | Freq: Once | ORAL | Status: AC
  Administered 2015-04-30: 25 mg via ORAL
  Filled 2015-04-30: qty 1

## 2015-04-30 MED ORDER — BENZTROPINE MESYLATE 1 MG PO TABS
0.5000 mg | ORAL_TABLET | Freq: Two times a day (BID) | ORAL | Status: DC
  Administered 2015-04-30 – 2015-05-01 (×2): 0.5 mg via ORAL
  Filled 2015-04-30 (×2): qty 1

## 2015-04-30 MED ORDER — TRAZODONE HCL 50 MG PO TABS
50.0000 mg | ORAL_TABLET | Freq: Every day | ORAL | Status: DC
  Administered 2015-04-30: 50 mg via ORAL
  Filled 2015-04-30: qty 1

## 2015-04-30 MED ORDER — HALOPERIDOL 2 MG PO TABS
2.0000 mg | ORAL_TABLET | Freq: Two times a day (BID) | ORAL | Status: DC
  Administered 2015-04-30 – 2015-05-01 (×2): 2 mg via ORAL
  Filled 2015-04-30 (×2): qty 1

## 2015-04-30 MED ORDER — LORAZEPAM 0.5 MG PO TABS
1.0000 mg | ORAL_TABLET | Freq: Three times a day (TID) | ORAL | Status: DC | PRN
  Administered 2015-04-30 (×2): 1 mg via ORAL
  Filled 2015-04-30 (×2): qty 2

## 2015-04-30 NOTE — Progress Notes (Addendum)
CM spoke with [place] who [place].  CM discussed and provided written information for uninsured accepting pcps, discussed the importance of pcp vs EDP services for f/u care, www.needymeds.org, www.goodrx.com, discounted pharmacies and other [place] resources such as [facility] , [facility], affordable care act, financial assistance, uninsured dental services, [place] med assist, DSS and  health department  Reviewed resources for [place] uninsured accepting pcps like [facility], [facility] [address], [facility], [facility], local urgent care centers, [facility], [facility], general medical clinics, family services of the [facility], [facility] plus others, medication resources, [facility] out patient pharmacies and housing Pt voiced understanding and appreciation of resources provided   Provided P[facility] contact information Pt did agreed to a referral Cm completed referral Pt to be [facility] by [facility] clinical liason  Pt pleasant and cooperative He sat up on side of bed to read through resources

## 2015-04-30 NOTE — ED Provider Notes (Signed)
  Physical Exam  BP 125/69 mmHg  Pulse 55  Temp(Src) 97 F (36.1 C) (Oral)  Resp 18  SpO2 100%  Physical Exam  ED Course  Procedures  [facility] under staff request.  D/w Dr Wilburt Finlayocherty      Selah Klang, MD 04/30/15 [phone]

## 2015-04-30 NOTE — Consult Note (Signed)
Telepsych Consultation   Reason for Consult:  Psychosis and Bizarre behavior Referring Physician: EDP Patient Identification: Samuel Martin MRN:  [id number] Principal Diagnosis: Acute Psychosis Diagnosis:   Patient Active Problem List   Diagnosis Date Noted  . 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 Acute psychosis [F29] 04/03/2015  . Marijuana abuse [F12.10] 04/03/2015    Total Time spent with patient: 30 minutes  Subjective:    Samuel Martin is an 19 y.o. male, single, black who presents accompanied to [facility] ED by Event organiser. Per EMS, Pt was causing a disturbance at a convenience store and police were called. Patient seen and chart reviewed. Chart review indicate that upon arrival to ED PT was  agitated and appears to be responding to internal stimuli. Hewas talking to people who are not in the room and appears to be having conversations with himself. He was laughing at times, agitated and angry at times. At times he was making rude gestures.   Patient  awakened from sleep intermittently during assessment .  Pt reports  he is at the [facility] and  his grand mother brought him in. Pt appears to understand questions and responding to the questions appropriately. Pt is alert, calm, cooperative, oriented to place.  He reports today's date as "twenty something" but smiles saying not sure of the day because he does not have his phone. Pt asks to use the bathroom following assessment,  asked to wait for the nurse and states, "so I can't go to the bathroom without someone" but waits till the nurse came to the room.   Pt denies suicidal/homicidal ideations and denies Auditory or Visual hallucination. It does not appear that pt is responding to any internal stimuli at this time.  Pt reports he smokes "weed" and would like to smoke it every day.  He was not able to say why he came to hospital, he smiles and says " lady I don't know".  Pt is positive for marijuana.   HPI:  Reviewed above note with updates  added. Patient was transferred from [name] to [place] for placement.  He is very disorganized, standing in front of his room dancing and smiling.  He is responding to internal stimuli.  He does not know why his in the hospital but stated"I am here"  He then laughed inappropriately.  Patient is smelling Malodorous, looks disheveled and unkempt.  He is accepted for admission and we will be seeking placement at any facility with available beds.  HPI Elements:   Location:  Psychosis.                              Quality: Acute psychosis, uses marijuana                             Severity: Sudden psychotic symptoms                             Timing: Acute, 2nd recorded episode of psychosis                             Duration: Previous occurrence 04/03/15, lasted 4-5 days, 2nd occurrence 7/16  Context: Unclear, grandmother suspects bad marijuana, but then occurred again  Past Medical History:  Past Medical History  Diagnosis Date  . Thrombocytopenia    History reviewed. No pertinent past surgical history. Family History: History reviewed. No pertinent family history. Social History:  History  Alcohol Use No     History  Drug Use Not on file    History   Social History  . Marital Status: Single    Spouse Name: N/A  . Number of Children: N/A  . Years of Education: N/A   Social History Main Topics  . Smoking status: Never Smoker   . Smokeless tobacco: Not on file  . Alcohol Use: No  . Drug Use: Not on file  . Sexual Activity: Not on file   Other Topics Concern  . None   Social History Narrative   Additional Social History:    Pain Medications: See PTA List Prescriptions: See PTA List Over the Counter: See PTA List History of alcohol / drug use?: Yes Longest period of sobriety (when/how long): Unknown Name of Substance 1: THC 1 - Age of First Use: Unknown 1 - Amount (size/oz): Unknown 1 - Frequency: Unknown 1 - Duration: Unknown 1 - Last Use /  Amount: Unknown                   Allergies:   Allergies  Allergen Reactions  . Shellfish-Derived Products Hives and Rash    Labs:  Results for orders placed or performed during the hospital encounter of 04/28/15 (from the past 48 hour(s))  Acetaminophen level     Status: Abnormal   Collection Time: 04/28/15  7:30 PM  Result Value Ref Range   Acetaminophen (Tylenol), Serum <10 (L) 10 - 30 ug/mL    Comment:        THERAPEUTIC CONCENTRATIONS VARY SIGNIFICANTLY. A RANGE OF 10-30 ug/mL MAY BE AN EFFECTIVE CONCENTRATION FOR MANY PATIENTS. HOWEVER, SOME ARE BEST TREATED AT CONCENTRATIONS OUTSIDE THIS RANGE. ACETAMINOPHEN CONCENTRATIONS >150 ug/mL AT 4 HOURS AFTER INGESTION AND >50 ug/mL AT 12 HOURS AFTER INGESTION ARE OFTEN ASSOCIATED WITH TOXIC REACTIONS.   Comprehensive metabolic panel     Status: Abnormal   Collection Time: 04/28/15  7:30 PM  Result Value Ref Range   Sodium 140 135 - 145 mmol/L   Potassium 4.7 3.5 - 5.1 mmol/L   Chloride 103 101 - 111 mmol/L   CO2 27 22 - 32 mmol/L   Glucose, Bld 92 65 - 99 mg/dL   BUN 18 6 - 20 mg/dL   Creatinine, Ser 1.48 (H) 0.61 - 1.24 mg/dL   Calcium 10.2 8.9 - 10.3 mg/dL   Total Protein 7.8 6.5 - 8.1 g/dL   Albumin 4.8 3.5 - 5.0 g/dL   AST 51 (H) 15 - 41 U/L   ALT 25 17 - 63 U/L   Alkaline Phosphatase 100 38 - 126 U/L   Total Bilirubin 2.8 (H) 0.3 - 1.2 mg/dL   GFR calc non Af Amer >60 >60 mL/min   GFR calc Af Amer >60 >60 mL/min    Comment: (NOTE) The eGFR has been calculated using the CKD EPI equation. This calculation has not been validated in all clinical situations. eGFR's persistently <60 mL/min signify possible Chronic Kidney Disease.    Anion gap 10 5 - 15  Ethanol     Status: None   Collection Time: 04/28/15  7:30 PM  Result Value Ref Range   Alcohol, Ethyl (B) <5 <5 mg/dL  Comment:        LOWEST DETECTABLE LIMIT FOR SERUM ALCOHOL IS 5 mg/dL FOR MEDICAL PURPOSES ONLY   Salicylate level     Status:  None   Collection Time: 04/28/15  7:30 PM  Result Value Ref Range   Salicylate Lvl <1.4 2.8 - 30.0 mg/dL  CBC with Differential     Status: Abnormal   Collection Time: 04/28/15  7:30 PM  Result Value Ref Range   WBC 6.5 4.0 - 10.5 K/uL   RBC 5.58 4.22 - 5.81 MIL/uL   Hemoglobin 16.6 13.0 - 17.0 g/dL   HCT 49.5 39.0 - 52.0 %   MCV 88.7 78.0 - 100.0 fL   MCH 29.7 26.0 - 34.0 pg   MCHC 33.5 30.0 - 36.0 g/dL   RDW 13.4 11.5 - 15.5 %   Platelets 102 (L) 150 - 400 K/uL    Comment: SPECIMEN CHECKED FOR CLOTS REPEATED TO VERIFY PLATELET COUNT CONFIRMED BY SMEAR    Neutrophils Relative % 55 43 - 77 %   Neutro Abs 3.6 1.7 - 7.7 K/uL   Lymphocytes Relative 35 12 - 46 %   Lymphs Abs 2.3 0.7 - 4.0 K/uL   Monocytes Relative 9 3 - 12 %   Monocytes Absolute 0.6 0.1 - 1.0 K/uL   Eosinophils Relative 1 0 - 5 %   Eosinophils Absolute 0.1 0.0 - 0.7 K/uL   Basophils Relative 0 0 - 1 %   Basophils Absolute 0.0 0.0 - 0.1 K/uL  Protime-INR     Status: Abnormal   Collection Time: 04/28/15  7:30 PM  Result Value Ref Range   Prothrombin Time 15.9 (H) 11.6 - 15.2 seconds   INR 1.25 0.00 - 1.49  Urine rapid drug screen (hosp performed)     Status: Abnormal   Collection Time: 04/29/15 12:20 AM  Result Value Ref Range   Opiates NONE DETECTED NONE DETECTED   Cocaine NONE DETECTED NONE DETECTED   Benzodiazepines NONE DETECTED NONE DETECTED   Amphetamines NONE DETECTED NONE DETECTED   Tetrahydrocannabinol POSITIVE (A) NONE DETECTED   Barbiturates NONE DETECTED NONE DETECTED    Comment:        DRUG SCREEN FOR MEDICAL PURPOSES ONLY.  IF CONFIRMATION IS NEEDED FOR ANY PURPOSE, NOTIFY LAB WITHIN 5 DAYS.        LOWEST DETECTABLE LIMITS FOR URINE DRUG SCREEN Drug Class       Cutoff (ng/mL) Amphetamine      1000 Barbiturate      200 Benzodiazepine   431 Tricyclics       540 Opiates          300 Cocaine          300 THC              50     Vitals: Blood pressure 142/92, pulse 63, temperature  98.4 F (36.9 C), temperature source Oral, resp. rate 18, SpO2 100 %.  Risk to Self: Suicidal Ideation: No (Pt disorganized and unable to answer questions) Suicidal Intent: No (Pt disorganized and unable to answer questions) Is patient at risk for suicide?: No (Pt disorganized and unable to answer questions) Suicidal Plan?: No (Pt disorganized and unable to answer questions) Access to Means: No (Pt disorganized and unable to answer questions) What has been your use of drugs/alcohol within the last 12 months?: Pt has history of using marijuana. UDS pending, How many times?: 0 Other Self Harm Risks: Pt appears to be responding to internal stimuli  Triggers for Past Attempts: None known Intentional Self Injurious Behavior: None Risk to Others: Homicidal Ideation: No (Pt disorganized and unable to answer questions) Thoughts of Harm to Others: No (Pt disorganized and unable to answer questions) Current Homicidal Intent: No (Pt disorganized and unable to answer questions) Current Homicidal Plan: No (Pt disorganized and unable to answer questions) Access to Homicidal Means: No (Pt disorganized and unable to answer questions) Identified Victim: None identified History of harm to others?: No (Pt disorganized and unable to answer questions) Assessment of Violence: In past 6-12 months Violent Behavior Description: Pt was combative on previous admission to ED Does patient have access to weapons?: No (Pt disorganized and unable to answer questions) Criminal Charges Pending?: No Does patient have a court date: No Prior Inpatient Therapy: Prior Inpatient Therapy: No Prior Therapy Dates: NA Prior Therapy Facilty/Provider(s): NA Reason for Treatment: NA Prior Outpatient Therapy: Prior Outpatient Therapy: No Prior Therapy Dates: NA Prior Therapy Facilty/Provider(s): NA Reason for Treatment: NA Does patient have an ACCT team?: No Does patient have Intensive In-House Services?  : No Does patient have  [facility] services? : No Does patient have P4CC services?: No  Current Facility-Administered Medications  Medication Dose Route Frequency Provider Last Rate Last Dose  . benztropine (COGENTIN) tablet 0.5 mg  0.5 mg Oral BID Delfin Gant, NP      . diphenhydrAMINE (BENADRYL) capsule 25 mg  25 mg Oral Once Delfin Gant, NP      . haloperidol (HALDOL) tablet 2 mg  2 mg Oral BID Delfin Gant, NP      . haloperidol (HALDOL) tablet 5 mg  5 mg Oral Once Delfin Gant, NP      . LORazepam (ATIVAN) tablet 1 mg  1 mg Oral Q8H PRN Delfin Gant, NP      . traZODone (DESYREL) tablet 50 mg  50 mg Oral QHS Delfin Gant, NP       No current outpatient prescriptions on file.    Musculoskeletal: Strength & Muscle Tone: within normal limits Gait & Station: normal Patient leans: N/A  Psychiatric Specialty Exam: Physical Exam  Review of Systems  Constitutional: Negative.   HENT: Negative.   Eyes: Negative.   Respiratory: Negative.   Cardiovascular: Negative.   Gastrointestinal: Negative.   Genitourinary: Negative.   Musculoskeletal: Negative.   Skin: Negative.   Neurological: Negative.   Endo/Heme/Allergies: Negative.   Psychiatric/Behavioral: Positive for suicidal ideas (Denies Suiciald ideas), hallucinations (Denies hearing voices or seeing things) and substance abuse (Pt admits to marijuana use).    Blood pressure 142/92, pulse 63, temperature 98.4 F (36.9 C), temperature source Oral, resp. rate 18, SpO2 100 %.There is no weight on file to calculate BMI.  General Appearance: In bed wearing hospital scrub  Eye Contact::  Good,   Speech:  Clear and Coherent and Slow  Volume:  Normal  Mood:  Euthymic  Affect:  Full Range  Thought Process:  Disorganized and Linear  Orientation:  Other:  place  Thought Content:  WDL  Suicidal Thoughts:  Denies Suicidal thoughts  Homicidal Thoughts:  Denies homicidal thoughts  Memory: Immediate memory, unable to assess  remote  Judgement:  Poor  Insight:  Shallow  Psychomotor Activity:  Normal  Concentration:  Fair  Recall:  [place] of Knowledge:Fair  Language: Good  Akathisia:  No  Handed:  Right  AIMS (if indicated):     Assets:  Communication Skills Physical Health Social Support  ADL's:  Intact  Cognition: Impaired,  Mild  Sleep:      Medical Decision Making: Review or order clinical lab tests (1), Established Problem, Worsening (2), Review of Medication Regimen & Side Effects (2) and Review of New Medication or Change in Dosage (2)   Treatment Plan Summary: Daily contact with patient to assess and evaluate symptoms and progress in treatment and Medication management  Plan:  Recommend psychiatric Inpatient admission when medically cleared.  Start Haldol 5 mg po now for severe agitation and Psychosis, Benadryl 25 mg po now for allergic reaction, Ativan 1 mg po every 8 hours as needed for agitation, Benztropine 0.5 mg po for EPS and Haldol 2 mg po bid for mood control. Disposition:   - Inpatient psychiatric hospitalization for evaluation of pscyhiotic episodes, safety and stabilization   Delfin Gant, PMHNP-BC 04/30/2015 1:01 PM  Patient seen face-to-face for psychiatric evaluation, chart reviewed and case discussed with the physician extender and developed treatment plan. Reviewed the information documented and agree with the treatment plan. Corena Pilgrim, MD

## 2015-04-30 NOTE — ED Notes (Signed)
Pt out of room using the phone, sitter at side.

## 2015-04-30 NOTE — ED Notes (Signed)
Pt mother updated on patient status and transfer.

## 2015-04-30 NOTE — BH Assessment (Signed)
[facility] Assessment Progress Note  The following facilities have been contacted to seek placement for this pt, with results as noted:  Beds available, information sent, decision pending:  [facility]   At capacity:  [facility]    Doylene Canninghomas [place], [place] Triage Specialist [phone]

## 2015-04-30 NOTE — ED Provider Notes (Signed)
[facility]   Pt was given PO haldol, PO ativan, resting comfortably. Has difficulty answering questions, but has no acute complaints.   1. Psychosis, unspecified psychosis type        Toy CookeyMegan Tyrelle Raczka, MD 04/30/15 1541

## 2015-04-30 NOTE — ED Notes (Signed)
Pt sitting up in bed watching TV with sitter calmly. States breakfast was good and has no needs. Pt cooperative with staff at this time.

## 2015-04-30 NOTE — ED Notes (Signed)
Pt being transported via Dispensing opticianofficer [facility] and Emelda FearFerguson.

## 2015-04-30 NOTE — ED Notes (Signed)
Patient seemed confused on admission to the unit.  He was not able to answer questions.  He kept staring at me and making faces.  He would then break out dancing and start motioning with his hands.  He appeared to be responding to internal stimuli.  He was given Haldol, lorazepam and diphenhydramine PO and went to sleep for a while.  He is talking some now and making a bit more sense.  He has been up ambulating in his room and in the hallway.  His appetite is good.  He has also used the phone a couple of times and seemed to have conversations.

## 2015-04-30 NOTE — ED Notes (Signed)
Pt ambulated to restroom without distress.  

## 2015-05-01 ENCOUNTER — Encounter (HOSPITAL_COMMUNITY): Payer: Self-pay | Admitting: *Deleted

## 2015-05-01 ENCOUNTER — Inpatient Hospital Stay (HOSPITAL_COMMUNITY)
Admission: AD | Admit: 2015-05-01 | Discharge: 2015-05-07 | DRG: 897 | Disposition: A | Discharge status: Home / Self Care | Payer: Federal, State, Local not specified - Other | Source: Intra-hospital | Attending: Psychiatry | Admitting: Psychiatry

## 2015-05-01 DIAGNOSIS — F29 Unspecified psychosis not due to a substance or known physiological condition: Secondary | ICD-10-CM | POA: Diagnosis present

## 2015-05-01 DIAGNOSIS — F12159 Cannabis abuse with psychotic disorder, unspecified: Principal | ICD-10-CM | POA: Diagnosis present

## 2015-05-01 DIAGNOSIS — F19150 Other psychoactive substance abuse with psychoactive substance-induced psychotic disorder with delusions: Secondary | ICD-10-CM | POA: Diagnosis not present

## 2015-05-01 DIAGNOSIS — F19959 Other psychoactive substance use, unspecified with psychoactive substance-induced psychotic disorder, unspecified: Secondary | ICD-10-CM | POA: Diagnosis present

## 2015-05-01 MED ORDER — ACETAMINOPHEN 325 MG PO TABS: 650.0000 mg | ORAL_TABLET | Freq: Four times a day (QID) | ORAL | Status: DC | PRN

## 2015-05-01 MED ORDER — TRAZODONE HCL 50 MG PO TABS
50.0000 mg | ORAL_TABLET | Freq: Every day | ORAL | Status: DC
  Administered 2015-05-03 – 2015-05-06 (×4): 50 mg via ORAL
  Filled 2015-05-01 (×2): qty 1
  Filled 2015-05-01: qty 14
  Filled 2015-05-01 (×6): qty 1

## 2015-05-01 MED ORDER — ALUM & MAG HYDROXIDE-SIMETH 200-200-20 MG/5ML PO SUSP: 30.0000 mL | ORAL | Status: DC | PRN

## 2015-05-01 MED ORDER — LORAZEPAM 1 MG PO TABS
1.0000 mg | ORAL_TABLET | Freq: Three times a day (TID) | ORAL | Status: DC | PRN
  Administered 2015-05-01 – 2015-05-04 (×4): 1 mg via ORAL
  Filled 2015-05-01 (×4): qty 1

## 2015-05-01 MED ORDER — MAGNESIUM HYDROXIDE 400 MG/5ML PO SUSP: 30.0000 mL | Freq: Every day | ORAL | Status: DC | PRN

## 2015-05-01 MED ORDER — BENZTROPINE MESYLATE 0.5 MG PO TABS
0.5000 mg | ORAL_TABLET | Freq: Two times a day (BID) | ORAL | Status: DC
  Administered 2015-05-01 – 2015-05-07 (×11): 0.5 mg via ORAL
  Filled 2015-05-01: qty 1
  Filled 2015-05-01: qty 14
  Filled 2015-05-01 (×10): qty 1
  Filled 2015-05-01: qty 14
  Filled 2015-05-01 (×5): qty 1

## 2015-05-01 MED ORDER — HALOPERIDOL 2 MG PO TABS
2.0000 mg | ORAL_TABLET | Freq: Two times a day (BID) | ORAL | Status: DC
  Administered 2015-05-01 – 2015-05-07 (×11): 2 mg via ORAL
  Filled 2015-05-01 (×12): qty 1
  Filled 2015-05-01: qty 14
  Filled 2015-05-01 (×2): qty 1
  Filled 2015-05-01: qty 14
  Filled 2015-05-01 (×2): qty 1

## 2015-05-01 NOTE — ED Notes (Signed)
[facility]  Left the unit ambulatory with [employer].  All belongings given to the transport team.

## 2015-05-01 NOTE — Progress Notes (Signed)
D: Pt denies SI/HI/AVH. Pt is pleasant and cooperative. Pt appears to be responding to internal stimuli at times when observed on the unit. Pt will smile inappropriately at times.  Pt verbal communication minimal, pt will nod and speak very softly.   A: Pt was offered support and encouragement. Pt was given scheduled medications. Pt was encourage to attend groups. Q 15 minute checks were done for safety.   R:Pt attends groups and interacts well with peers and staff. Pt is taking medication. Pt has no complaints at this time v.Pt receptive to treatment and safety maintained on unit.

## 2015-05-01 NOTE — BH Assessment (Signed)
[facility] Assessment Progress Note  Per Thedore MinsMojeed Akintayo, MD, this pt requires psychiatric hospitalization.  Samuel Heinrichina Tate, RN, [id number]  Pt is under IVC and [facility]  Pt's nurse, Rudean HittDawnaly, has been notified, and agrees to call report to [phone].  Pt is to be transported via [employer].  Doylene Canninghomas Jaksen Fiorella, MA Triage Specialist [phone]

## 2015-05-01 NOTE — Consult Note (Signed)
[facility]   Reason for Consult:  Psychosis Referring Physician:  EDP Patient Identification: Su HiltRahshon Martin MRN:  [id number] Principal Diagnosis: Acute Psychosis Diagnosis:   Patient Active Problem List   Diagnosis Date Noted  . 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 Marijuana abuse [F12.10] 04/03/2015    Total Time spent with patient: 30 minutes  Subjective:   Samuel Martin is a 19 y.o. male patient admitted with psychosis.  HPI:  19 y.o. male, single, black who [facility]. Per EMS, Pt was causing a disturbance at a convenience store and police were called. Pt is currently agitated and appears to be responding to internal stimuli. He is talking to people who are not in the room and appears to be having conversations with himself. He is laughing at times, agitated and angry at times. At times he was making rude gestures. Pt was unable to respond appropriately to any questions and complete clinical assessment was unable to be obtained at this time.  Pt is dressed in hospital scrubs, alert, disoriented with rambling speech and restless motor behavior. Eye contact is poor. Pt's mood is angry and irritable and affect is labile. Thought process is disorganized.   [facility] due to similar symptoms. Attached is information from previous encounter provided by psychiatry, Nani SkillernJohn Conrad Withrow, NP on 04/07/15:  Subjective:  Samuel Martin is a 19 y.o. male patient admitted with reports of psychotic behavior and being combative with ED staff members upon arrival. Pt known to this NP from prior assessment. Pt was initially psychotic on 04/01/15 and was awaiting placement. Pt could barely participate in the assessment. Today, pt seen and chart reviewed. Pt has made dramatic progress and is no longer presenting as psychotic. He is calm, cooperative,  alert/oriented x4, and answers questions appropriately. His family is visiting today and they report that this is his baseline and that they have no reservations about him coming home. Pt clearly denies suicidal/homicidal ideation which is congruent with ED staff objective observation. Pt also denies psychosis and does not appear to be responding to any internal stimuli. A review of notes supports this improvement and pt is stable for discharge with outpatient follow-up at this time.   HPI:  Samuel Martin is a 19 [facility].o. [facility] and [facility] symptoms. Pt is accompanied by his aunt. Pt is combative upon arrival to ED and requires restraints and administration of Geodon at one point. He responds to internal stimuli, mumbles incoherently to himself, and provides irrelevant responses to questions asked by counselor and other medical staff in the ED. At other times, pt closes his eyes and refuses to answer questions altogether. Pt's mood alternates between angry and histrionic, becoming slightly tearful. Pt was very guarded during TTS assessment and denied any sx of mood disorder. He also shook his head "no" when asked about A/VH but it was apparent that the pt had been responding to internal stimuli.  Counselor spoke with pt's grandmother privately prior to TTS assessment. Pt's grandmother states that she has never witnessed this type of behavior in the pt before and suspects that he "got a hold of some bad drugs". She states that pt disappeared from Wed - Fri and she has no idea where he was or what he was doing. Pt's grandmother states that pt has admitted to marijuana use before but  no other substances. She reports that the pt has no prior hx of mental illness or psychiatric hospitalizations. Pt is staying with his grandmother only temporarily. He usually resides with his parents. Counselor unable to verify if pt's psychosis is substance-induced or if the pt is  experiencing his first psychotic episode due to mental illness, as the pt has not provided a urine sample to medical staff as of 23:57 on 04/01/15. HPI Elements:   Location:  generalized. Quality:  acute. Severity:  severe. Timing:  constant. Duration:  few days. Context:  stressors.  Past Medical History:  Past Medical History  Diagnosis Date  . Thrombocytopenia    History reviewed. No pertinent past surgical history. Family History: History reviewed. No pertinent family history. Social History:  History  Alcohol Use No     History  Drug Use Not on file    History   Social History  . Marital Status: Single    Spouse Name: N/A  . Number of Children: N/A  . Years of Education: N/A   Social History Main Topics  . Smoking status: Never Smoker   . Smokeless tobacco: Not on file  . Alcohol Use: No  . Drug Use: Not on file  . Sexual Activity: Not on file   Other Topics Concern  . None   Social History Narrative   Additional Social History:    Pain Medications: See PTA List Prescriptions: See PTA List Over the Counter: See PTA List History of alcohol / drug use?: Yes Longest period of sobriety (when/how long): Unknown Name of Substance 1: THC 1 - Age of First Use: Unknown 1 - Amount (size/oz): Unknown 1 - Frequency: Unknown 1 - Duration: Unknown 1 - Last Use / Amount: Unknown                   Allergies:   Allergies  Allergen Reactions  . Shellfish-Derived Products Hives and Rash    Labs: No results found for this or any previous visit (from the past 48 hour(s)).  Vitals: Blood pressure 129/90, pulse 73, temperature 97.6 F (36.4 C), temperature source Oral, resp. rate 18, SpO2 100 %.  Risk to Self: Suicidal Ideation: No (Pt disorganized and unable to answer questions) Suicidal Intent: No (Pt disorganized and unable to answer questions) Is patient at risk for suicide?: No (Pt disorganized and unable to answer questions) Suicidal Plan?: No (Pt  disorganized and unable to answer questions) Access to Means: No (Pt disorganized and unable to answer questions) What has been your use of drugs/alcohol within the last 12 months?: Pt has history of using marijuana. UDS pending, How many times?: 0 Other Self Harm Risks: Pt appears to be responding to internal stimuli Triggers for Past Attempts: None known Intentional Self Injurious Behavior: None Risk to Others: Homicidal Ideation: No (Pt disorganized and unable to answer questions) Thoughts of Harm to Others: No (Pt disorganized and unable to answer questions) Current Homicidal Intent: No (Pt disorganized and unable to answer questions) Current Homicidal Plan: No (Pt disorganized and unable to answer questions) Access to Homicidal Means: No (Pt disorganized and unable to answer questions) Identified Victim: None identified History of harm to others?: No (Pt disorganized and unable to answer questions) Assessment of Violence: In past 6-12 months Violent Behavior Description: Pt was combative on previous admission to ED Does patient have access to weapons?: No (Pt disorganized and unable to answer questions) Criminal Charges Pending?: No Does patient have a court date: No Prior Inpatient Therapy:  Prior Inpatient Therapy: No Prior Therapy Dates: NA Prior Therapy Facilty/Provider(s): NA Reason for Treatment: NA Prior Outpatient Therapy: Prior Outpatient Therapy: No Prior Therapy Dates: NA Prior Therapy Facilty/Provider(s): NA Reason for Treatment: NA Does patient have an ACCT team?: No Does patient have Intensive In-House Services?  : No Does patient have [facility] services? : No Does patient have P4CC services?: No  Current Facility-Administered Medications  Medication Dose Route Frequency Provider Last Rate Last Dose  . benztropine (COGENTIN) tablet 0.5 mg  0.5 mg Oral BID Earney Navy, NP   0.5 mg at 05/01/15 1025  . haloperidol (HALDOL) tablet 2 mg  2 mg Oral BID Earney Navy, NP   2 mg at 05/01/15 1026  . LORazepam (ATIVAN) tablet 1 mg  1 mg Oral Q8H PRN Earney Navy, NP   1 mg at 04/30/15 2045  . traZODone (DESYREL) tablet 50 mg  50 mg Oral QHS Earney Navy, NP   50 mg at 04/30/15 2045   No current outpatient prescriptions on file.    Musculoskeletal: Strength & Muscle Tone: within normal limits Gait & Station: normal Patient leans: N/A  Psychiatric Specialty Exam: Physical Exam  Review of Systems  Constitutional: Negative.   HENT: Negative.   Eyes: Negative.   Respiratory: Negative.   Cardiovascular: Negative.   Gastrointestinal: Negative.   Genitourinary: Negative.   Musculoskeletal: Negative.   Skin: Negative.   Neurological: Negative.   Endo/Heme/Allergies: Negative.   Psychiatric/Behavioral: Positive for hallucinations.    Blood pressure 129/90, pulse 73, temperature 97.6 F (36.4 C), temperature source Oral, resp. rate 18, SpO2 100 %.There is no weight on file to calculate BMI.  General Appearance: Casual  Eye Contact::  Good  Speech:  Normal Rate  Volume:  Normal  Mood:  Anxious  Affect:  Congruent  Thought Process:  Coherent  Orientation:  Full (Time, Place, and Person)  Thought Content:  Hallucinations: Auditory Visual  Suicidal Thoughts:  No  Homicidal Thoughts:  No  Memory:  Immediate;   Fair Recent;   Fair Remote;   Fair  Judgement: Impaired  Insight:  Lacking  Psychomotor Activity:  Normal  Concentration:  Fair  Recall:  [employer] of Knowledge:Fair  Language: Good  Akathisia:  No  Handed:  Right  AIMS (if indicated):     Assets:  Housing Leisure Time Physical Health Resilience Social Support  ADL's:  Intact  Cognition: WNL  Sleep:      Medical Decision Making: Review of Psycho-Social Stressors (1), Review or order clinical lab tests (1) and Review of Medication Regimen & Side Effects (2)  Treatment Plan Summary: Diagnosis:  Acute psychosis Daily contact with patient to assess and  evaluate symptoms and progress in treatment, Medication management and Plan safety monitoring, medication management (continue with Haldol 2 mg BID for psychosis, Cogentin 0.5 mg BID to prevent EPS, Ativan 1 mg every six hours PRN agitation, Trazodone 50 mg at bedtime for sleep PRN; admit to [facility] for stabilization  Plan:  Recommend psychiatric Inpatient admission when medically cleared. Disposition: Eloise Levels, PMH-NP 05/01/2015 3:34 PM Patient seen face-to-face for psychiatric evaluation, chart reviewed and case discussed with the physician extender and developed treatment plan. Reviewed the information documented and agree with the treatment plan. Thedore Mins, MD

## 2015-05-01 NOTE — Progress Notes (Signed)
Patient ID: Samuel Martin, male   DOB: 06/15/1996, 19 y.o.   MRN: [id number] Patient admitted to [id number] with substance abuse and psychosis.Patient positive for marijuana on drug screen. Pt was causing a disturbance at a convenience store and police were called. Patient IVC'd. Pt is currently cooperative but appears to be responding to internal stimuli.Patient mumbling to himself during admission. He is laughing at times and has flat, blunted affect at times.Patient difficult to assess and is unable to answer some questions. Patient denies HI, SI, and AVH as well. Is unaware why he is here. Vitals stable. Oriented to unit. Checks initiated.

## 2015-05-01 NOTE — Tx Team (Addendum)
Initial Interdisciplinary Treatment Plan   PATIENT STRESSORS: Substance abuse   PATIENT STRENGTHS: General fund of knowledge Supportive family/friends   PROBLEM LIST: Problem List/Patient Goals Date to be addressed Date deferred Reason deferred Estimated date of resolution  Alterted thjought processes 05/02/15     "Getting out of here." 05/02/15                                                DISCHARGE CRITERIA:  Ability to meet basic life and health needs Improved stabilization in mood, thinking, and/or behavior  PRELIMINARY DISCHARGE PLAN: Attend aftercare/continuing care group Return to previous living arrangement  PATIENT/FAMIILY INVOLVEMENT: This treatment plan has been presented to and reviewed with the patient, Marquies Willemsen. The patient has been given the opportunity to ask questions and make suggestions.  Loren RacerMaggio, Kimberly J 05/01/2015, 7:02 PM

## 2015-05-01 NOTE — Progress Notes (Signed)
The focus of this group is to help patients review their daily goal of treatment and discuss progress on daily workbooks. Pt attended the evening group session but responded minimally to discussion prompts from the Writer. Pt reported having had a good day, though he could not cite anything that happened either good or bad. Pt reported having no additional needs from Nursing Staff this evening. When asked about his future goals, Pt simply stated that his only goal was to "not be here." Pt's affect was flat.

## 2015-05-01 NOTE — ED Notes (Signed)
Patient has been cooperative throughout the day.  Continues to respond to internal stimuli, but denies hearing voices.  Taking medications as prescribed.  Appetite good.

## 2015-05-02 ENCOUNTER — Encounter (HOSPITAL_COMMUNITY): Payer: Self-pay | Admitting: Psychiatry

## 2015-05-02 DIAGNOSIS — F29 Unspecified psychosis not due to a substance or known physiological condition: Secondary | ICD-10-CM

## 2015-05-02 MED ORDER — OLANZAPINE 10 MG PO TBDP
10.0000 mg | ORAL_TABLET | Freq: Every day | ORAL | Status: DC
Start: 2015-05-02 — End: 2015-05-05
  Administered 2015-05-02 – 2015-05-04 (×3): 10 mg via ORAL
  Filled 2015-05-02 (×6): qty 1

## 2015-05-02 NOTE — Plan of Care (Signed)
Problem: Ineffective individual coping Goal: LTG: Patient will report a decrease in negative feelings Outcome: Progressing Pt stated he was doing real good.  Problem: Alteration in mood & ability to function due to Goal: LTG-Pt reports reduction in suicidal thoughts (Patient reports reduction in suicidal thoughts and is able to verbalize a safety plan for whenever patient is feeling suicidal)  Outcome: Progressing Pt denies SI at this time

## 2015-05-02 NOTE — Progress Notes (Signed)
D: Pt denies SI/HI/AVH. Pt is pleasant and cooperative. Pt stated " I got Fixed" . Pt said his medications seem to be working. Pt would do good possibly with a Dance movement psychotherapistmentor.  A: Pt was offered support and encouragement. Pt was given scheduled medications. Pt was encourage to attend groups. Q 15 minute checks were done for safety. Pt educated on street drugs and synthetic THC and asked pt  about  Going to college or getting training.   R:Pt attends groups and interacts well with peers and staff. Pt is taking medication. Pt has no complaints at this time.Pt receptive to treatment and safety maintained on unit.

## 2015-05-02 NOTE — Progress Notes (Signed)
NUTRITION ASSESSMENT  Pt identified as at risk on the Malnutrition Screen Tool  INTERVENTION: 1. Educated patient on the importance of nutrition and encouraged intake of food and beverages. 2. Discussed weight goals. 3. Supplements: none ordered   NUTRITION DIAGNOSIS: No nutrition dx at this time.  Goal: Pt to meet >/= 90% of their estimated nutrition needs.  Monitor:  PO intake  Assessment:  Pt seen for MST. Limited weight hx available in the chart and from pt. Unable to determine weight changes PTA. No supplements have been ordered at this time and do not feel that pt currently needs them.  19 y.o. male  Height: Ht Readings from Last 1 Encounters:  05/01/15 5\' 10"  (1.778 m)    Weight: Wt Readings from Last 1 Encounters:  05/01/15 164 lb (74.39 kg)    Weight Hx: Wt Readings from Last 10 Encounters:  05/01/15 164 lb (74.39 kg)  07/28/13 175 lb 4.8 oz (79.516 kg) (79 %*, Z = 0.82)  03/17/12 161 lb 13.1 oz (73.4 kg) (73 %*, Z = 0.60)  03/13/12 173 lb 8 oz (78.7 kg) (83 %*, Z = 0.97)   * Growth percentiles are based on CDC 2-20 Years data.    BMI:  Body mass index is 23.53 kg/(m^2). Pt meets criteria for normal weight based on current BMI.  Estimated Nutritional Needs: Kcal: 25-30 kcal/kg Protein: > 1 gram protein/kg Fluid: 1 ml/kcal  Diet Order: Diet Heart Room service appropriate?: Yes; Fluid consistency:: Thin Pt is also offered choice of unit snacks mid-morning and mid-afternoon.  Pt is eating as desired.   Lab results and medications reviewed.     Trenton GammonJessica Decie Verne, RD, LDN Inpatient Clinical Dietitian Pager # [phone] After hours/weekend pager # [phone]

## 2015-05-02 NOTE — [facility] Suicide Risk Assessment (Signed)
[facility]   Nursing information obtained from:    Demographic factors:    Current Mental Status:    Loss Factors:    Historical Factors:    Risk Reduction Factors:    Total Time spent with patient: 45 minutes Principal Problem: <principal problem not specified> Diagnosis:   Patient Active Problem List   Diagnosis Date Noted  . Psychosis [F29] 05/01/2015  . 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 Acute psychosis [F29] 04/03/2015  . Marijuana abuse [F12.10] 04/03/2015     Continued Clinical Symptoms:  Alcohol Use Disorder Identification Test Final Score (AUDIT): 0 The "Alcohol Use Disorders Identification Test", Guidelines for Use in Primary Care, Second Edition.  World Science writerHealth Organization [facility](WHO). Score between 0-7:  no or low risk or alcohol related problems. Score between 8-15:  moderate risk of alcohol related problems. Score between 16-19:  high risk of alcohol related problems. Score 20 or above:  warrants further diagnostic evaluation for alcohol dependence and treatment.   CLINICAL FACTORS:   Alcohol/Substance Abuse/Dependencies Currently Psychotic   Psychiatric Specialty Exam: Physical Exam  ROS  Blood pressure 139/81, pulse 73, temperature 98.7 F (37.1 C), temperature source Oral, resp. rate 16, height 5\' 10"  (1.778 m), weight 74.39 kg (164 lb).Body mass index is 23.53 kg/(m^2).    COGNITIVE FEATURES THAT CONTRIBUTE TO RISK:  Closed-mindedness, Polarized thinking and Thought constriction (tunnel vision)    SUICIDE RISK:   Mild PLAN OF CARE: Supportive approach/coping skills                               Psychosis; evaluate further use an atypical antipsychotic                               Substance abuse; encourage abstinence                               Improve reality testing  Medical Decision Making:  Review of Psycho-Social Stressors (1), Review or order clinical lab tests (1) and Review of Medication Regimen & Side Effects (2)  I certify  that inpatient services furnished can reasonably be expected to improve the patient's condition.   Cha Gomillion A 05/02/2015, 6:12 PM
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
## 2015-05-02 NOTE — Tx Team (Signed)
Interdisciplinary Treatment Plan Update (Adult)  Date:  05/02/2015   Time Reviewed:  8:47 AM   Progress in Treatment: Attending groups: Yes. Participating in groups:  Yes. Taking medication as prescribed:  Yes. Tolerating medication:  Yes. Family/Significant othe contact made:  NO, but pt signed consent Patient understands diagnosis:  No  Limited insight Discussing patient identified problems/goals with staff:  Yes, see initial care plan. Medical problems stabilized or resolved:  Yes. Denies suicidal/homicidal ideation: Yes. Issues/concerns per patient self-inventory:  No. Other:  New problem(s) identified:  Discharge Plan or Barriers: return home, follow up outpt  Reason for Continuation of Hospitalization: Medication stabilization Other; describe Disorganized, confused thinking  Comments:  Samuel Martin is a 19 y.o. male patient admitted with reports of psychotic behavior and being combative with ED staff members upon arrival. Pt known to this NP from prior assessment. Pt was initially psychotic on 03/30/15 and was awaiting placement. Pt could barely participate in the assessment. Today,04/07/15 pt seen and chart reviewed. Pt has made dramatic progress and is no longer presenting as psychotic. He is calm, cooperative, alert/oriented x4, and answers questions appropriately. His family is visiting today and they report that this is his baseline and that they have no reservations about him coming home. Pt clearly denies suicidal/homicidal ideation which is congruent with ED staff objective observation. Pt also denies psychosis and does not appear to be responding to any internal stimuli. A review of notes supports this improvement and pt is stable for discharge with outpatient follow-up at this time.   HPI:  Samuel Martin is a 19 y.o. male [facility] behavior and psychotic symptoms. Pt is accompanied by his aunt. Pt is combative upon arrival to ED and requires  restraints and administration of Geodon at one point. He responds to internal stimuli, mumbles incoherently to himself, and provides irrelevant responses to questions asked by counselor and other medical staff in the ED. At other times, pt closes his eyes and refuses to answer questions altogether. Pt's mood alternates between angry and histrionic, becoming slightly tearful. Pt was very guarded during TTS assessment and denied any sx of mood disorder. He also shook his head "no" when asked about A/VH but it was apparent that the pt had been responding to internal stimuli.  Counselor spoke with pt's grandmother privately prior to TTS assessment. Pt's grandmother states that she has never witnessed this type of behavior in the pt before and suspects that he "got a hold of some bad drugs". She states that pt disappeared from Wed - Fri and she has no idea where he was or what he was doing. Pt's grandmother states that pt has admitted to marijuana use before but no other substances. She reports that the pt has no prior hx of mental illness or psychiatric hospitalizations. Pt is staying with his grandmother only temporarily. He usually resides with his parents. Counselor unable to verify if pt's psychosis is substance-induced or if the pt is experiencing his first psychotic episode due to mental illness, as the pt has not provided a urine sample to medical staff as of 23:57 on 04/01/15.  Haldol trial  Estimated length of stay: 2-3 days  New goal(s):  Review of initial/current patient goals per problem list:     Attendees: Patient:  05/02/2015 8:47 AM   Family:   05/02/2015 8:47 AM   Physician:  Geoffery LyonsIrving Lugo, MD 05/02/2015 8:47 AM   Nursing:   Nadean CorwinKim Maggio, RN 05/02/2015 8:47 AM   CSW:  [name] Gerald, [place]   05/02/2015 8:47 AM   Other:  05/02/2015 8:47 AM   Other:   05/02/2015 8:47 AM   Other:  Onnie Boer, Nurse CM 05/02/2015 8:47 AM   Other:  Leisa Lenz, [facility] 05/02/2015 8:47 AM   Other:  Tomasita Morrow, [facility]  05/02/2015 8:47 AM   Other:  05/02/2015 8:47 AM   Other:  05/02/2015 8:47 AM   Other:  05/02/2015 8:47 AM   Other:  05/02/2015 8:47 AM   Other:  05/02/2015 8:47 AM   Other:   05/02/2015 8:47 AM    Scribe for Treatment Team:   Ida Rogue, 05/02/2015 8:47 AM

## 2015-05-02 NOTE — Progress Notes (Signed)
Patient ID: Samuel Martin, male   DOB: 10/26/1995, 19 y.o.   MRN: [id number] D: Patient continues to respond to internal stimuli.  Fixed smile much of the time.Will answer simple questions appropriately but is unable to carry on a logical conversation.   A: Patient given emotional support from RN. Patient given medications per MD orders. Patient encouraged to attend groups and unit activities. Patient encouraged to come to staff with any questions or concerns.  R: Patient remains cooperative and appropriate. Will continue to monitor patient for safety.
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
## 2015-05-03 NOTE — Progress Notes (Signed)
D- Patient is pleasant and cooperative.  He is observed interacting well with his peers in the milieu.  Denies SI/HI/AVH and pain.  No complaints.  Patient expresses readiness to be discharged and states that he will be on his "best behavior" in order to go home as soon as possible.   A- Scheduled medications administered to patient, per MD orders. Support and encouragement provided.  Routine safety checks conducted every 15 minutes.  Patient informed to notify staff with problems or concerns. R- No adverse drug reactions noted. Patient contracts for safety at this time. Patient compliant with medications and treatment plan. Patient receptive, calm, and cooperative.  Patient remains safe at this time.

## 2015-05-03 NOTE — B[facility] Group Notes (Signed)
[facility] Group Notes:  (Counselor/Nursing/MHT/Case Management/Adjunct)  05/03/2015 1:15PM  Type of Therapy:  Group Therapy  Participation Level:  Active  Participation Quality:  Appropriate  Affect:  Flat  Cognitive:  Oriented  Insight:  Improving  Engagement in Group:  Limited  Engagement in Therapy:  Limited  Modes of Intervention:  Discussion, Exploration and Socialization  Summary of Progress/Problems: The topic for group was balance in life.  Pt participated in the discussion about when their life was in balance and out of balance and how this feels.  Pt discussed ways to get back in balance and short term goals they can work on to get where they want to be. Stayed the entire time and attended to the conversation, but declined to contribute anything himself. "Y'all have covered it."   [name] 05/03/2015 4:10 PM

## 2015-05-03 NOTE — Progress Notes (Signed)
[facility] MD Progress Note  05/03/2015 4:34 PM Briton Sellman  MRN:  [id number] Subjective:  Samuel Martin is taking the medications. He is opening more about delusional material. He is worried about the last piece of a tattoo he got on his left arm and how he thinks the tattoo with broken lines changed the course of his veins on that arm. He does say that he thinks things started to happen after he smoked some marijuana. He is aware of some of the behavior that prompted his admission. He states he is willing to take the medication and although he enjoys smoking pot, he is willing to stop if that was going to help. He states at the end he just wants to be around women and have babies (smiles)  Principal Problem: <principal problem not specified> Diagnosis:   Patient Active Problem List   Diagnosis Date Noted  . Psychosis [F29] 05/01/2015  . 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 Acute psychosis [F29] 04/03/2015  . Marijuana abuse [F12.10] 04/03/2015   Total Time spent with patient: 30 minutes   Past Medical History:  Past Medical History  Diagnosis Date  . Thrombocytopenia    History reviewed. No pertinent past surgical history. Family History: History reviewed. No pertinent family history. Social History:  History  Alcohol Use No     History  Drug Use Not on file    History   Social History  . Marital Status: Single    Spouse Name: N/A  . Number of Children: N/A  . Years of Education: N/A   Social History Main Topics  . Smoking status: Never Smoker   . Smokeless tobacco: Not on file  . Alcohol Use: No  . Drug Use: Not on file  . Sexual Activity: Not on file   Other Topics Concern  . None   Social History Narrative   Additional History:    Sleep: Fair  Appetite:  Fair   Assessment:   Musculoskeletal: Strength & Muscle Tone: within normal limits Gait & Station: normal Patient leans: normal   Psychiatric Specialty Exam: Physical Exam  Review of Systems  Constitutional: Negative.    HENT: Negative.   Eyes: Negative.   Respiratory: Negative.   Cardiovascular: Negative.   Gastrointestinal: Negative.   Genitourinary: Negative.   Musculoskeletal: Negative.   Skin: Negative.   Neurological: Negative.   Endo/Heme/Allergies: Negative.   Psychiatric/Behavioral: Positive for substance abuse. The patient is nervous/anxious.     Blood pressure 120/81, pulse 82, temperature 97.6 F (36.4 C), temperature source Oral, resp. rate 18, height  (1.778 m), weight 74.39 kg (164 lb).Body mass index is 23.53 kg/(m^2).  General Appearance: Disheveled  Eye Solicitor::  Fair  Speech:  Clear and Coherent  Volume:  fluctuates  Mood:  Anxious and worried  Affect:  Restricted  Thought Process:  Coherent and Goal Directed  Orientation:  Full (Time, Place, and Person)  Thought Content:  Delusions and Paranoid Ideation  Suicidal Thoughts:  No  Homicidal Thoughts:  No  Memory:  Immediate;   Fair Recent;   Fair Remote;   Fair  Judgement:  Fair  Insight:  Lacking  Psychomotor Activity:  Restlessness  Concentration:  Fair  Recall:  [employer] of Knowledge:Fair  Language: Fair  Akathisia:  No  Handed:  Right  AIMS (if indicated):     Assets:  Desire for Improvement Housing Social Support  ADL's:  Intact  Cognition: WNL  Sleep:  Number of Hours: 6.75     Current Medications:  Current Facility-Administered Medications  Medication Dose Route Frequency Provider Last Rate Last Dose  . acetaminophen (TYLENOL) tablet 650 mg  650 mg Oral Q6H PRN Charm Rings, NP      . alum & mag hydroxide-simeth (MAALOX/MYLANTA) 200-200-20 MG/5ML suspension 30 mL  30 mL Oral Q4H PRN Charm Rings, NP      . benztropine (COGENTIN) tablet 0.5 mg  0.5 mg Oral BID Charm Rings, NP   0.5 mg at 05/03/15 1623  . haloperidol (HALDOL) tablet 2 mg  2 mg Oral BID Rachael Fee, MD   2 mg at 05/03/15 1624  . LORazepam (ATIVAN) tablet 1 mg  1 mg Oral Q8H PRN Charm Rings, NP   1 mg at 05/02/15 2150  .  magnesium hydroxide (MILK OF MAGNESIA) suspension 30 mL  30 mL Oral Daily PRN Charm Rings, NP      . OLANZapine zydis (ZYPREXA) disintegrating tablet 10 mg  10 mg Oral QHS Rachael Fee, MD   10 mg at 05/02/15 2141  . traZODone (DESYREL) tablet 50 mg  50 mg Oral QHS Charm Rings, NP   50 mg at 05/01/15 2200    Lab Results: No results found for this or any previous visit (from the past 48 hour(s)).  Physical Findings: AIMS: Facial and Oral Movements Muscles of Facial Expression: None, normal Lips and Perioral Area: None, normal Jaw: None, normal Tongue: None, normal,Extremity Movements Upper (arms, wrists, hands, fingers): None, normal Lower (legs, knees, ankles, toes): None, normal, Trunk Movements Neck, shoulders, hips: None, normal, Overall Severity Severity of abnormal movements (highest score from questions above): None, normal Incapacitation due to abnormal movements: None, normal Patient's awareness of abnormal movements (rate only patient's report): No Awareness, Dental Status Current problems with teeth and/or dentures?: No Does patient usually wear dentures?: No  CIWA:    COWS:     Treatment Plan Summary: Daily contact with patient to assess and evaluate symptoms and progress in treatment and Medication management Supportive approach/coping skills Delusional ideas; continue to work with the Zyprexa at night. Will continue the smaller dose of haldol during the day Substance abuse; will continue to make the case for him to quit Will work to improve reality testing   Medical Decision Making:  Review of Psycho-Social Stressors (1) and Review of Medication Regimen & Side Effects (2)     Beverley Allender A 05/03/2015, 4:34 PM

## 2015-05-04 NOTE — [facility] Group Notes (Signed)
[facility] LCSW Aftercare Discharge Planning Group Note   05/04/2015 3:22 PM  Participation Quality:  Engaged  Mood/Affect:  Flat  Depression Rating:  denies  Anxiety Rating:  denies  Thoughts of Suicide:  No Will you contract for safety?   NA  Current AVH:  Denies current, but admits he hears voices from time to time  Plan for Discharge/Comments:  States he slept well, which roommate confirms and says it is because he had the [facility] cranked all night.  Appetite is good.  No side effects related to medication.  States he is planning on leaving today.  Limited insight.  Transportation Means:   Supports:  [name]

## 2015-05-04 NOTE — [facility] Group Notes (Signed)
[facility] LCSW Group Therapy     Type of Therapy:  Group Therapy  Participation Level:  Minimal, somewhat sleepy but listened appropriately most of the time  Participation Quality:  Drowsy  Affect:  Appropriate  Cognitive:  Oriented  Insight:  Developing/Improving  Engagement in Therapy:  Developing/Improving  Modes of Intervention:  Discussion and Support  Summary of Progress/Problems:  Finding Balance in Life. Today's group focused on defining balance in one's own words, identifying things that can knock one off balance, and exploring healthy ways to maintain balance in life. Group members were asked to provide an example of a time when they felt off balance, describe how they handled that situation, and process healthier ways to regain balance in the future. Group members were asked to share the most important tool for maintaining balance that they learned while at [facility] and how they plan to apply this method after discharge.  Patient was sleepy during group, but appropriate when awake.  States that he is balanced and is now listening to advice of his therapist who has told him that "the voices are in your head", "stop doing drugs."  States he will listen to this advice, was confronted by peer who offered his perspective on hunger for male mentoring and a caution to be careful who he listened to/followed.  Patient voiced "listening to the advice of others" as a way to maintain balance in life.   Sallee Lange 05/04/2015 3:35 PM

## 2015-05-04 NOTE — [facility] Suicide Risk Assessment (Signed)
[facility] INPATIENT:  Family/Significant Other Suicide Prevention Education  Suicide Prevention Education:  Education Completed; No one has been identified by the patient as the family member/significant other with whom the patient will be residing, and identified as the person(s) who will aid the patient in the event of a mental health crisis (suicidal ideations/suicide attempt).  With written consent from the patient, the family member/significant other has been provided the following suicide prevention education, prior to the and/or following the discharge of the patient.  The suicide prevention education provided includes the following:  Suicide risk factors  Suicide prevention and interventions  National Suicide Hotline telephone number  [facility] assessment telephone number  [facility] Emergency Assistance 911  [facility] and/or Residential Mobile Crisis Unit telephone number  Request made of family/significant other to:  Remove weapons (e.g., guns, rifles, knives), all items previously/currently identified as safety concern.    Remove drugs/medications (over-the-counter, prescriptions, illicit drugs), all items previously/currently identified as a safety concern.  The family member/significant other verbalizes understanding of the suicide prevention education information provided.  The family member/significant other agrees to remove the items of safety concern listed above. The patient did not endorse SI at the time of admission, nor did the patient c/o SI during the stay here.  SPE not required.  However, I did talk to Ms velazquez, grandmother, who lives in [place], but is involved, about a crises plan.  [name] 05/04/2015, 8:31 AM

## 2015-05-04 NOTE — Progress Notes (Signed)
[facility] MD Progress Note  05/04/2015 4:34 PM Samuel Martin  MRN:  [id number] Subjective:  Pt interviewed, chart reviewed, case discussed with unit staff. Pt enjoyed karaoke last night, which reminded him of a girl he wanted in the past. Samuel Martin is taking the medications in the hospital. He is opening more about delusional material, talks about nature and needed a girlfriend and sex. He does say that he thinks things started to happen after he smoked some marijuana. He is aware of some of the behavior that prompted his admission. He states he is willing to take the medication and although he enjoys smoking pot, he is willing to stop if that was going to help. He states at the end he just wants to be around women and have babies (smiles). He likes Spider Man and Cleda Clarks. Tolerating meds well. Principal Problem: <principal problem not specified> Diagnosis:   Patient Active Problem List   Diagnosis Date Noted  . Psychosis [F29] 05/01/2015  . 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 Acute psychosis [F29] 04/03/2015  . Marijuana abuse [F12.10] 04/03/2015   Total Time spent with patient: 30 minutes   Past Medical History:  Past Medical History  Diagnosis Date  . Thrombocytopenia    History reviewed. No pertinent past surgical history. Family History: History reviewed. No pertinent family history. Social History:  History  Alcohol Use No     History  Drug Use Not on file    History   Social History  . Marital Status: Single    Spouse Name: N/A  . Number of Children: N/A  . Years of Education: N/A   Social History Main Topics  . Smoking status: Never Smoker   . Smokeless tobacco: Not on file  . Alcohol Use: No  . Drug Use: Not on file  . Sexual Activity: Not on file   Other Topics Concern  . None   Social History Narrative   Additional History:    Sleep: Fair  Appetite:  Fair   Assessment:   Musculoskeletal: Strength & Muscle Tone: within normal limits Gait & Station:  normal Patient leans: normal   Psychiatric Specialty Exam: Physical Exam  Review of Systems  Constitutional: Negative.   HENT: Negative.   Eyes: Negative.   Respiratory: Negative.   Cardiovascular: Negative.   Gastrointestinal: Negative.   Genitourinary: Negative.   Musculoskeletal: Negative.   Skin: Negative.   Neurological: Negative.   Endo/Heme/Allergies: Negative.   Psychiatric/Behavioral: Positive for substance abuse. The patient is nervous/anxious.     Blood pressure 134/76, pulse 85, temperature 98.6 F (37 C), temperature source Oral, resp. rate 18, height 5\' 10"  (1.778 m), weight 74.39 kg (164 lb).Body mass index is 23.53 kg/(m^2).  General Appearance: Casual and Fairly Groomed  Patent attorney::  Fair  Speech:  Clear and Coherent  Volume:  fluctuates  Mood:  Anxious and worried  Affect:  Restricted  Thought Process:  Irrelevant and Tangential  Orientation:  Full (Time, Place, and Person)  Thought Content:  Delusions and Paranoid Ideation. No AVH.  Suicidal Thoughts:  No  Homicidal Thoughts:  No  Memory:  Immediate;   Fair Recent;   Fair Remote;   Fair  Judgement:  Fair  Insight:  Lacking  Psychomotor Activity:  Restlessness  Concentration:  Fair  Recall:  [employer] of Knowledge:Fair  Language: Fair  Akathisia:  No  Handed:  Right  AIMS (if indicated):     Assets:  Desire for Improvement Housing Social Support  ADL's:  Intact  Cognition: WNL  Sleep:  Number of Hours: 5.25     Current Medications: Current Facility-Administered Medications  Medication Dose Route Frequency Provider Last Rate Last Dose  . acetaminophen (TYLENOL) tablet 650 mg  650 mg Oral Q6H PRN Charm Rings, NP      . alum & mag hydroxide-simeth (MAALOX/MYLANTA) 200-200-20 MG/5ML suspension 30 mL  30 mL Oral Q4H PRN Charm Rings, NP      . benztropine (COGENTIN) tablet 0.5 mg  0.5 mg Oral BID Charm Rings, NP   0.5 mg at 05/04/15 0756  . haloperidol (HALDOL) tablet 2 mg  2 mg  Oral BID Rachael Fee, MD   2 mg at 05/04/15 0755  . LORazepam (ATIVAN) tablet 1 mg  1 mg Oral Q8H PRN Charm Rings, NP   1 mg at 05/03/15 2327  . magnesium hydroxide (MILK OF MAGNESIA) suspension 30 mL  30 mL Oral Daily PRN Charm Rings, NP      . OLANZapine zydis (ZYPREXA) disintegrating tablet 10 mg  10 mg Oral QHS Rachael Fee, MD   10 mg at 05/03/15 2147  . traZODone (DESYREL) tablet 50 mg  50 mg Oral QHS Charm Rings, NP   50 mg at 05/03/15 2147    Lab Results: No results found for this or any previous visit (from the past 48 hour(s)).  Physical Findings: AIMS: Facial and Oral Movements Muscles of Facial Expression: None, normal Lips and Perioral Area: None, normal Jaw: None, normal Tongue: None, normal,Extremity Movements Upper (arms, wrists, hands, fingers): None, normal Lower (legs, knees, ankles, toes): None, normal, Trunk Movements Neck, shoulders, hips: None, normal, Overall Severity Severity of abnormal movements (highest score from questions above): None, normal Incapacitation due to abnormal movements: None, normal Patient's awareness of abnormal movements (rate only patient's report): No Awareness, Dental Status Current problems with teeth and/or dentures?: No Does patient usually wear dentures?: No  CIWA:    COWS:     Treatment Plan Summary: Daily contact with patient to assess and evaluate symptoms and progress in treatment and Medication management Supportive approach/coping skills Delusional ideas; continue to work with the Zyprexa at night. Will continue the smaller dose of haldol during the day Substance abuse; will continue to make the case for him to quit Will work to improve reality testing   Medical Decision Making:  Review of Psycho-Social Stressors (1) and Review of Medication Regimen & Side Effects (2)     Saphia Vanderford 05/04/2015, 4:34 PM

## 2015-05-04 NOTE — Progress Notes (Signed)
Samuel Martin is seen OOB UAL on the [address] hall today..he tolerates this well. HE is quiet. He keeps to himelf. He is cooperative and pleasant when he presents to the med window this morning. When he is asked how he slept last night he says " fine" and nods his head slowly...he says " chill" when this nurse asks him how he is feeling today.   A  He takes all of his scheduled meds as ordered and he denies experiencing SI today, as well as AH, or SI. He dodges all questions from this Clinical research associate, concerning his psychosis and will not engage in conversation about his illicit drug use.   R Safety in place and poc cont to include continuing to [facility] safety.

## 2015-05-04 NOTE — Progress Notes (Signed)
The patient attended last evening's Karaoke group and was appropriate.  

## 2015-05-04 NOTE — Progress Notes (Signed)
D: Pt with a blank affect denies any form of depression, anxiety or pain. Pt also denies SI/HI/AVH. Pt can however be seen reacting to some internal stimuli with occasional smile and nodding of the head. Pt continue to be cooperative and nonviolent.  A: Pt attended karaoke group. Medications administered as prescribed.  Support, encouragement, and safe environment provided.  15-minute safety checks continue. R: Pt was med compliant.  Safety checks continue.

## 2015-05-05 DIAGNOSIS — F19150 Other psychoactive substance abuse with psychoactive substance-induced psychotic disorder with delusions: Secondary | ICD-10-CM

## 2015-05-05 MED ORDER — OLANZAPINE 10 MG PO TBDP
10.0000 mg | ORAL_TABLET | Freq: Two times a day (BID) | ORAL | Status: DC | PRN
  Filled 2015-05-05: qty 10

## 2015-05-05 NOTE — Progress Notes (Signed)
Adult Psychoeducational Group Note  Date:  05/05/2015 Time:  9:38 PM  Group Topic/Focus:  Wrap-Up Group:   The focus of this group is to help patients review their daily goal of treatment and discuss progress on daily workbooks.  Participation Level:  Active  Participation Quality:  Appropriate  Affect:  Appropriate  Cognitive:  Appropriate  Insight: Appropriate  Engagement in Group:  Engaged  Modes of Intervention:  Discussion  Additional Comments: The patient expressed that he attended group.The patient also attended wrap up group.  Octavio Manns 05/05/2015, 9:38 PM

## 2015-05-05 NOTE — [facility] Group Notes (Signed)
[facility] Group Notes:  (Clinical Social Work)  05/05/2015  11:15-12:00PM  Summary of Progress/Problems:   The main focus of today's process group was to discuss patients' feelings related to being hospitalized, as well as the difference between "being" and "having" a mental health diagnosis.  It was agreed in general by the group that it would be preferable to avoid future hospitalizations, and we discussed means of doing that.  As a follow-up, problems with adhering to medication recommendations were discussed.  The patient expressed his primary feeling about being hospitalized is "heaven," adding that there is food and a bed, and so the hospital has the best there is to offer.  Throughout the remainder of group he did not speak, but often made wide gestures that indicated he was responding to what was being shared in group, seemed appropriate at the time.  He then started raising his hands, talking to himself, seemed to be responding to internal stimuli.  Type of Therapy:  Group Therapy - Process  Participation Level:  Active  Participation Quality:  Attentive  Affect:  Not Congruent  Cognitive:  Hallucinating  Insight:  Developing/Improving  Engagement in Therapy:  Developing/Improving  Modes of Intervention:  Exploration, Discussion  Ambrose Mantle, LCSW 05/05/2015, 4:39 PM

## 2015-05-05 NOTE — Progress Notes (Signed)
Patient ID: Samuel Martin, male   DOB: [birth year], 19 y.o.   MRN: [id number] [facility] MD Progress Note  05/05/2015 2:21 PM Hebert Dooling  MRN:  [id number] Subjective:  Patient seen,  interviewed, chart reviewed and case discussed with unit staff. Patient denies any new symptoms, he is minimizing his symptoms. However, he reports that he starting feeling bizarre after he smoked cannabis. He has been observed by the staff talking and smiling to himself inappropriately. He is fixated on the needs to get a girlfriend and establish a relationship like other people do. So far, he has been compliant with his treatment and the unit milieu.  Principal Problem: Drug reaction resulting in brief psychotic states Diagnosis:   Patient Active Problem List   Diagnosis Date Noted  . 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 Acute psychosis [F29] 04/03/2015  . Marijuana abuse [F12.10] 04/03/2015   Total Time spent with patient: 25 minutes   Past Medical History:  Past Medical History  Diagnosis Date  . Thrombocytopenia    History reviewed. No pertinent past surgical history. Family History: History reviewed. No pertinent family history. Social History:  History  Alcohol Use No     History  Drug Use Not on file    History   Social History  . Marital Status: Single    Spouse Name: N/A  . Number of Children: N/A  . Years of Education: N/A   Social History Main Topics  . Smoking status: Never Smoker   . Smokeless tobacco: Not on file  . Alcohol Use: No  . Drug Use: Not on file  . Sexual Activity: Not on file   Other Topics Concern  . None   Social History Narrative   Additional History:    Sleep: Fair  Appetite:  Fair   Assessment: Patient remains disorganized, delusional and bizarre. Musculoskeletal: Strength & Muscle Tone: within normal limits Gait & Station: normal Patient leans: normal   Psychiatric Specialty  Exam: Physical Exam  Review of Systems  Constitutional: Negative.   HENT: Negative.   Eyes: Negative.   Respiratory: Negative.   Cardiovascular: Negative.   Gastrointestinal: Negative.   Genitourinary: Negative.   Musculoskeletal: Negative.   Skin: Negative.   Neurological: Negative.   Endo/Heme/Allergies: Negative.   Psychiatric/Behavioral: Positive for substance abuse. The patient is nervous/anxious.     Blood pressure 124/71, pulse 99, temperature 97.9 F (36.6 C), temperature source Oral, resp. rate 16, height  (1.778 m), weight 74.39 kg (164 lb).Body mass index is 23.53 kg/(m^2).  General Appearance: Casual and Fairly Groomed  Patent attorney::  Fair  Speech:  Clear and Coherent  Volume:  fluctuates  Mood:  Anxious and worried  Affect:  Restricted  Thought Process:  Irrelevant and Tangential  Orientation:  Full (Time, Place, and Person)  Thought Content:  Delusions and Paranoid Ideation.  Suicidal Thoughts:  No  Homicidal Thoughts:  No  Memory:  Immediate;   Fair Recent;   Fair Remote;   Fair  Judgement:  Fair  Insight:  Lacking  Psychomotor Activity:  Restlessness  Concentration:  Fair  Recall:  [employer] of Knowledge:Fair  Language: Fair  Akathisia:  No  Handed:  Right  AIMS (if indicated):     Assets:  Desire for Improvement Housing Social Support  ADL's:  Intact  Cognition: WNL  Sleep:  Number of Hours: 5.25     Current Medications: Current Facility-Administered Medications  Medication Dose Route  Frequency Provider Last Rate Last Dose  . acetaminophen (TYLENOL) tablet 650 mg  650 mg Oral Q6H PRN Charm Rings, NP      . alum & mag hydroxide-simeth (MAALOX/MYLANTA) 200-200-20 MG/5ML suspension 30 mL  30 mL Oral Q4H PRN Charm Rings, NP      . benztropine (COGENTIN) tablet 0.5 mg  0.5 mg Oral BID Charm Rings, NP   0.5 mg at 05/05/15 0802  . haloperidol (HALDOL) tablet 2 mg  2 mg Oral BID Rachael Fee, MD   2 mg at 05/05/15 0801  . LORazepam  (ATIVAN) tablet 1 mg  1 mg Oral Q8H PRN Charm Rings, NP   1 mg at 05/04/15 2300  . magnesium hydroxide (MILK OF MAGNESIA) suspension 30 mL  30 mL Oral Daily PRN Charm Rings, NP      . OLANZapine zydis (ZYPREXA) disintegrating tablet 10 mg  10 mg Oral Q12H PRN Deklin Bieler      . traZODone (DESYREL) tablet 50 mg  50 mg Oral QHS Charm Rings, NP   50 mg at 05/04/15 2118    Lab Results: No results found for this or any previous visit (from the past 48 hour(s)).  Physical Findings: AIMS: Facial and Oral Movements Muscles of Facial Expression: None, normal Lips and Perioral Area: None, normal Jaw: None, normal Tongue: None, normal,Extremity Movements Upper (arms, wrists, hands, fingers): None, normal Lower (legs, knees, ankles, toes): None, normal, Trunk Movements Neck, shoulders, hips: None, normal, Overall Severity Severity of abnormal movements (highest score from questions above): None, normal Incapacitation due to abnormal movements: None, normal Patient's awareness of abnormal movements (rate only patient's report): No Awareness, Dental Status Current problems with teeth and/or dentures?: No Does patient usually wear dentures?: No  CIWA:    COWS:     Treatment Plan Summary: Daily contact with patient to assess and evaluate symptoms and progress in treatment and Medication management Supportive approach/coping skills Continue Haldol 2mg  bid for psychosis/delusions Continue Cogentin 0.5mg  bid for EPS Prevention. Continue Zyprexa 10mg  q12h as needed for agitation.  Medical Decision Making:  Established Problem, Stable/Improving (1), Review or order clinical lab tests (1), Review of Medication Regimen & Side Effects (2) and Review of New Medication or Change in Dosage (2)     Thedore Mins, MD 05/05/2015, 2:21 PM

## 2015-05-05 NOTE — Progress Notes (Signed)
D: Pt with a blank affect continue to deny any form of depression, anxiety or pain. Pt also denies SI/HI/AVH. Pt can however continue to reacting to some internal stimuli with occasional smile and nodding of the head. Pt continue to be cooperative and nonviolent.   A: Medications administered as prescribed.  Support, encouragement, and safe environment provided.  15-minute safety checks continue. R: Pt was med compliant.  Safety checks continue 

## 2015-05-05 NOTE — Progress Notes (Signed)
Samuel Martin is seen OOB UAL on the [facility] hall today.Marland KitchenHe remains quiet. He does not initiate conversation with staff...but is seen laughing and mumbling ( to himself?).    A He takes his scheduled meds os ordered and he completes his daily assessment and on it he writes " 0/0/0/" as he rates his feelings of depression, hopelessness and anxiety .   R He attends his groups. He is compliant with meds and therapies. Safety is maintained.

## 2015-05-06 NOTE — Progress Notes (Signed)
Adult Psychoeducational Group Note  Date:  05/06/2015 Time:  9:43 PM  Group Topic/Focus:  Wrap-Up Group:   The focus of this group is to help patients review their daily goal of treatment and discuss progress on daily workbooks.  Participation Level:  Active  Participation Quality:  Appropriate  Affect:  Appropriate  Cognitive:  Appropriate  Insight: Appropriate  Engagement in Group:  Engaged  Modes of Intervention:  Discussion  Additional Comments: The patient attended group. Yalena Colon Lee 05/06/2015, 9:43 PM 

## 2015-05-06 NOTE — Progress Notes (Signed)
Patient ID: Samuel Martin, male   DOB: 02/02/1996, 19 y.o.   MRN: [id number] [facility] MD Progress Note  05/06/2015 3:18 PM Philip Kotlyar  MRN:  [id number] Subjective: "I am doing just fine, I think I am ready to go home.''  Objective: Patient seen,  interviewed, chart reviewed and case discussed with treatment team. Patient denies anxiety, depression, psychosis, delusional thinking or cravings for illicit drugs. He behavior/thinking has become more organized and less bizarre.  However, he  Continues to talk and smiles to himself inappropriately. He is fixated on the needs to get a girlfriend and establish a relationship like other people do. He remains compliant with his treatment and the unit milieu.  Principal Problem: Drug reaction resulting in brief psychotic states Diagnosis:   Patient Active Problem List   Diagnosis Date Noted  . 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 Acute psychosis [F29] 04/03/2015  . Marijuana abuse [F12.10] 04/03/2015   Total Time spent with patient: 25 minutes   Past Medical History:  Past Medical History  Diagnosis Date  . Thrombocytopenia    History reviewed. No pertinent past surgical history. Family History: History reviewed. No pertinent family history. Social History:  History  Alcohol Use No     History  Drug Use Not on file    History   Social History  . Marital Status: Single    Spouse Name: N/A  . Number of Children: N/A  . Years of Education: N/A   Social History Main Topics  . Smoking status: Never Smoker   . Smokeless tobacco: Not on file  . Alcohol Use: No  . Drug Use: Not on file  . Sexual Activity: Not on file   Other Topics Concern  . None   Social History Narrative   Additional History:    Sleep: Fair  Appetite:  Fair   Assessment: Patient remains disorganized, delusional and bizarre. Musculoskeletal: Strength & Muscle Tone: within normal limits Gait &  Station: normal Patient leans: normal   Psychiatric Specialty Exam: Physical Exam  Review of Systems  Constitutional: Negative.   HENT: Negative.   Eyes: Negative.   Respiratory: Negative.   Cardiovascular: Negative.   Gastrointestinal: Negative.   Genitourinary: Negative.   Musculoskeletal: Negative.   Skin: Negative.   Neurological: Negative.   Endo/Heme/Allergies: Negative.   Psychiatric/Behavioral: Positive for substance abuse. The patient is nervous/anxious.     Blood pressure 130/76, pulse 96, temperature 98.6 F (37 C), temperature source Oral, resp. rate 20, height 5\' 10"  (1.778 m), weight 74.39 kg (164 lb).Body mass index is 23.53 kg/(m^2).  General Appearance: Casual and Fairly Groomed  Patent attorney::  Fair  Speech:  Clear and Coherent  Volume:  fluctuates  Mood:  Anxious and worried  Affect:  Restricted  Thought Process:  Irrelevant and Tangential  Orientation:  Full (Time, Place, and Person)  Thought Content:  Delusions and Paranoid Ideation.  Suicidal Thoughts:  No  Homicidal Thoughts:  No  Memory:  Immediate;   Fair Recent;   Fair Remote;   Fair  Judgement:  Fair  Insight:  Lacking  Psychomotor Activity:  Restlessness  Concentration:  Fair  Recall:  [employer] of Knowledge:Fair  Language: Fair  Akathisia:  No  Handed:  Right  AIMS (if indicated):     Assets:  Desire for Improvement Housing Social Support  ADL's:  Intact  Cognition: WNL  Sleep:  Number of Hours: 4.5  Current Medications: Current Facility-Administered Medications  Medication Dose Route Frequency Provider Last Rate Last Dose  . acetaminophen (TYLENOL) tablet 650 mg  650 mg Oral Q6H PRN Charm Rings, NP      . alum & mag hydroxide-simeth (MAALOX/MYLANTA) 200-200-20 MG/5ML suspension 30 mL  30 mL Oral Q4H PRN Charm Rings, NP      . benztropine (COGENTIN) tablet 0.5 mg  0.5 mg Oral BID Charm Rings, NP   0.5 mg at 05/06/15 0836  . haloperidol (HALDOL) tablet 2 mg  2 mg  Oral BID Rachael Fee, MD   2 mg at 05/06/15 [phone]  . LORazepam (ATIVAN) tablet 1 mg  1 mg Oral Q8H PRN Charm Rings, NP   1 mg at 05/04/15 2300  . magnesium hydroxide (MILK OF MAGNESIA) suspension 30 mL  30 mL Oral Daily PRN Charm Rings, NP      . OLANZapine zydis (ZYPREXA) disintegrating tablet 10 mg  10 mg Oral Q12H PRN Johnetta Sloniker      . traZODone (DESYREL) tablet 50 mg  50 mg Oral QHS Charm Rings, NP   50 mg at 05/05/15 2133    Lab Results: No results found for this or any previous visit (from the past 48 hour(s)).  Physical Findings: AIMS: Facial and Oral Movements Muscles of Facial Expression: None, normal Lips and Perioral Area: None, normal Jaw: None, normal Tongue: None, normal,Extremity Movements Upper (arms, wrists, hands, fingers): None, normal Lower (legs, knees, ankles, toes): None, normal, Trunk Movements Neck, shoulders, hips: None, normal, Overall Severity Severity of abnormal movements (highest score from questions above): None, normal Incapacitation due to abnormal movements: None, normal Patient's awareness of abnormal movements (rate only patient's report): No Awareness, Dental Status Current problems with teeth and/or dentures?: No Does patient usually wear dentures?: No  CIWA:    COWS:     Treatment Plan Summary: Daily contact with patient to assess and evaluate symptoms and progress in treatment and Medication management Supportive approach/coping skills Continue Haldol  bid for psychosis/delusions Continue Cogentin 0.5mg  bid for EPS Prevention. Continue Zyprexa  q12h as needed for agitation.  Medical Decision Making:  Established Problem, Stable/Improving (1), Review or order clinical lab tests (1), Review of Medication Regimen & Side Effects (2) and Review of New Medication or Change in Dosage (2)   Thedore Mins, MD 05/06/2015, 3:18 PM

## 2015-05-06 NOTE — Progress Notes (Signed)
Samuel Martin remains quiet. Samuel Martin  Cont to be observed periodicaly  Laughing inapprorpiately. When Samuel Martin is asked how Samuel Martin is doing, Samuel Martin repsonds" fine" . Samuel Martin just grins when this nurse tries to engage him in conversation...like how Samuel Martin is feeling,...what Samuel Martin is experiencing.    A Samuel Martin takes his medications  As scheduled and Samuel Martin completes his daily assessment and on it Samuel Martin writes Samuel Martin denies SI today and Samuel Martin rates his depression, hopelessness and anxiety " 0/0/10", respectively.    R Safety is in place.

## 2015-05-06 NOTE — [facility] Group Notes (Signed)
[facility] Group Notes:  (Clinical Social Work)  05/06/2015  [facility] Group Notes:  (Clinical Social Work)  05/06/2015  11:00AM-12:00PM  Summary of Progress/Problems:  The main focus of today's process group was to listen to a variety of genres of music and to identify that different types of music provoke different responses.  The patient then was able to identify personally what was soothing for them, as well as energizing.  The patient did not speak during group, but made sweeping hand motions that indicated his approval of various pieces of music.  He appeared to be speaking to unseen persons as well.  Type of Therapy:  Music Therapy   Participation Level:  Active  Participation Quality:  Attentive   Affect:  Blunted  Cognitive:  Hallucinating  Insight:  Improving  Engagement in Therapy:  Engaged  Modes of Intervention:   Activity, Exploration  Samuel Mantle, LCSW 05/06/2015

## 2015-05-07 MED ORDER — TRAZODONE HCL 50 MG PO TABS: 50.0000 mg | ORAL_TABLET | Freq: Every day | ORAL | Status: DC

## 2015-05-07 MED ORDER — BENZTROPINE MESYLATE 0.5 MG PO TABS: ORAL_TABLET | ORAL | Status: DC

## 2015-05-07 MED ORDER — OLANZAPINE 10 MG PO TBDP: 10.0000 mg | ORAL_TABLET | Freq: Two times a day (BID) | ORAL | Status: DC | PRN
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
## 2015-05-07 NOTE — Progress Notes (Signed)
D: Pt with a blank affect continue to deny any form of depression, anxiety or pain. Pt also denies SI/HI/AVH. Pt can however continue to reacting to some internal stimuli with occasional smile and nodding of the head. Pt continue to be cooperative and nonviolent.   A: Medications administered as prescribed.  Support, encouragement, and safe environment provided.  15-minute safety checks continue. R: Pt was med compliant.  Safety checks continue

## 2015-05-07 NOTE — Progress Notes (Signed)
DISCHARGE NOTE: D: Patient was alert, oriented, in stable condition, and ambulatory with a steady gait upon discharge. Pt denies SI/HI and VH.  A: AVS reviewed and given to pt. Follow up reviewed with pt. Resources reviewed with pt, including [facility]. Prescriptions/Medications given to pt. Belongings returned to pt. Pt given time to ask questions and express concerns. Pt encouraged to follow up with PCP for any additional medical concerns. R: Pt D/C'd to "mom."

## 2015-05-07 NOTE — Tx Team (Signed)
Interdisciplinary Treatment Plan Update (Adult)  Date:  05/07/2015   Time Reviewed:  8:29 AM   Progress in Treatment: Attending groups: Yes. Participating in groups:  Yes. Taking medication as prescribed:  Yes. Tolerating medication:  Yes. Family/Significant othe contact made:  Yes Patient understands diagnosis:  Yes   Discussing patient identified problems/goals with staff:  Yes, see initial care plan. Medical problems stabilized or resolved:  Yes. Denies suicidal/homicidal ideation: Yes. Issues/concerns per patient self-inventory:  No. Other:  New problem(s) identified:  Discharge Plan or Barriers: return home, follow up outpt  Reason for Continuation of Hospitalization:   Comments:  Estimated length of stay:  D/C today  New goal(s):  Review of initial/current patient goals per problem list:     Attendees: Patient:  05/07/2015 8:29 AM   Family:   05/07/2015 8:29 AM   Physician:  Jomarie Longs, MD 05/07/2015 8:29 AM   Nursing:   Marzetta Board, RN 05/07/2015 8:29 AM   CSW:    [name] Gerald, LCSW   05/07/2015 8:29 AM   Other:  05/07/2015 8:29 AM   Other:   05/07/2015 8:29 AM   Other:  Onnie Boer, Nurse CM 05/07/2015 8:29 AM   Other:  Leisa Lenz, [facility] 05/07/2015 8:29 AM   Other:  Tomasita Morrow, P4CC  05/07/2015 8:29 AM   Other:  05/07/2015 8:29 AM   Other:  05/07/2015 8:29 AM   Other:  05/07/2015 8:29 AM   Other:  05/07/2015 8:29 AM   Other:  05/07/2015 8:29 AM   Other:   05/07/2015 8:29 AM    Scribe for Treatment Team:   Ida Rogue, 05/07/2015 8:29 AM

## 2015-05-07 NOTE — Progress Notes (Signed)
  [facility] Adult Case Management Discharge Plan :  Will you be returning to the same living situation after discharge:  Yes,  home with mother At discharge, do you have transportation home?: Yes,  family Do you have the ability to pay for your medications: Yes,  mental health  Release of information consent forms completed and in the chart;  Patient's signature needed at discharge.  Patient to Follow up at: Follow-up Information    Follow up with [facility].   Why:  Go to the walk-in clinic M-F between 8 and 11AM for your hospital follow up appointment   Contact information:   [address]  [place]  [phone]      Patient denies SI/HI: Yes,  yes    Safety Planning and Suicide Prevention discussed: Yes,  yes  Have you used any form of tobacco in the last 30 days? (Cigarettes, Smokeless Tobacco, Cigars, and/or Pipes): Yes  Has patient been referred to the Quitline?: Yes, faxed on 05/07/15  Ida Rogue 05/07/2015, 1:15 PM

## 2015-05-07 NOTE — [facility] Suicide Risk Assessment (Signed)
Samuel Martin Discharge Suicide Risk Assessment   Demographic Factors:  Male and Adolescent or young adult  Total Time spent with patient: 30 minutes  Musculoskeletal: Strength & Muscle Tone: within normal limits Gait & Station: normal Patient leans: normal  Psychiatric Specialty Exam: Physical Exam  Review of Systems  Constitutional: Negative.   HENT: Negative.   Eyes: Negative.   Respiratory: Negative.   Cardiovascular: Negative.   Gastrointestinal: Negative.   Genitourinary: Negative.   Musculoskeletal: Negative.   Skin: Negative.   Neurological: Negative.   Endo/Heme/Allergies: Negative.   Psychiatric/Behavioral: Positive for substance abuse.    Blood pressure 125/70, pulse 78, temperature 97.7 F (36.5 C), temperature source Oral, resp. rate 20, height  (1.778 m), weight 74.39 kg (164 lb).Body mass index is 23.53 kg/(m^2).  General Appearance: Fairly Groomed  Patent attorney::  Fair  Speech:  Clear and Coherent409  Volume:  Normal  Mood:  Euthymic  Affect:  Appropriate  Thought Process:  Coherent and Goal Directed  Orientation:  Full (Time, Place, and Person)  Thought Content:  plans as he moves on  Suicidal Thoughts:  No  Homicidal Thoughts:  No  Memory:  Immediate;   Fair Recent;   Fair Remote;   Fair  Judgement:  Fair  Insight:  Shallow  Psychomotor Activity:  Normal  Concentration:  Fair  Recall:  [employer] of Knowledge:Fair  Language: Fair  Akathisia:  No  Handed:  Right  AIMS (if indicated):     Assets:  Desire for Improvement Housing Social Support  Sleep:  Number of Hours: 4.25  Cognition: WNL  ADL's:  Intact   Have you used any form of tobacco in the last 30 days? (Cigarettes, Smokeless Tobacco, Cigars, and/or Pipes): Yes  Has this patient used any form of tobacco in the last 30 days? (Cigarettes, Smokeless Tobacco, Cigars, and/or Pipes) Yes, Prescription not provided because: nocotine patches will be given  Mental Status Per Nursing  Assessment::   On Admission:     Current Mental Status by Physician: Samuel Martin states he feels ready to go home today. Does admit to the awareness that there is another him inside he calls Samuel Martin that is angry and that he is the one who came out and as a consequence he ended up being here. States he (Samuel Martin) keeps him inside under control. He describes himself as calm a lover wanting for people to be at piece. He states he has been talking to other patients to be sure they can keep themselves calm. He is willing to take the Zyprexa at night as it helps him sleep. He is not wanting to take the medications during the day. He plans to go back with his grandmother and go back to work if at all possible tomorrow. 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 He also plans to abstain from smoking pot as although he states it calms him he can see how it can also have the opposite effect   Loss Factors: NA  Historical Factors: NA  Risk Reduction Factors:   Employed, Living with another person, especially a relative and Positive social support  Continued Clinical Symptoms:  Alcohol/Substance Abuse/Dependencies  Cognitive Features That Contribute To Risk:  Closed-mindedness, Polarized thinking and Thought constriction (tunnel vision)    Suicide Risk:  Minimal: No identifiable suicidal ideation.  Patients presenting with no risk factors but with morbid ruminations; may be classified as minimal risk based on the severity  of the depressive symptoms  Principal Problem: Drug reaction resulting in brief psychotic states Discharge Diagnoses:  Patient Active Problem List   Diagnosis Date Noted  . 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 Acute psychosis [F29] 04/03/2015  . Marijuana abuse [F12.10] 04/03/2015    Follow-up Information    Follow up with [facility].   Why:  Go to the walk-in clinic M-F between 8 and 11AM  for your hospital follow up appointment   Contact information:   [address]  [place]  [phone]] [phone]      Plan Of Care/Follow-up recommendations:  Activity:  as tolerated Diet:  regular Follow up [facility] as above Is patient on multiple antipsychotic therapies at discharge:  No   Has Patient had three or more failed trials of antipsychotic monotherapy by history:  No  Recommended Plan for Multiple Antipsychotic Therapies: NA    Samuel Martin A 05/07/2015, 1:04 PM

## 2015-05-07 NOTE — Progress Notes (Signed)
D: Patient is alert and mildly confused. Pt's mood and affect is euphoric and blunted, Pt smiles at inappropriate times. Pt's speech is disorganized.  Pt denies SI/HI and VH. Pt reports AH, pt refuses to elaborate and initially denies, but later shakes head to confirm AH to RN. Pt rates depression, hopelessness, anxiety 0/10. Pt appears preoccupied and delusional but forwards little. Pt reports his goal for the day is "get a job" by "find love."  Pt is observed placing medication under his tongue this morning, pt cooperative with confrontation from RN and later swallowed medication with much encouragement. Pt reports on his self inventory sheet that he is having "heart" pain, 10/10, VS WDL, pt reports this is emotional pain. Pt is attending unit groups today. A: Active listening by RN. Encouragement/Support provided to pt. Medication education reviewed with pt. Pt encouraged to take medications, MD [place] made aware of pt cheeking medication this morning. Scheduled medications administered per providers orders (See MAR). 15 minute checks continued per protocol for patient safety.  R: Patient cooperative and receptive to nursing interventions. Pt remains safe. Pt needs encouragement to swallow medications.

## 2016-02-23 ENCOUNTER — Encounter (HOSPITAL_COMMUNITY): Payer: Self-pay | Admitting: Emergency Medicine

## 2016-02-23 ENCOUNTER — Emergency Department (HOSPITAL_COMMUNITY)
Admission: EM | Admit: 2016-02-23 | Discharge: 2016-02-25 | Disposition: A | Discharge status: Other Acute Inpatient Hospital | Payer: Medicaid Other | Attending: Emergency Medicine | Admitting: Emergency Medicine

## 2016-02-23 DIAGNOSIS — Z79899 Other long term (current) drug therapy: Secondary | ICD-10-CM | POA: Insufficient documentation

## 2016-02-23 DIAGNOSIS — F23 Brief psychotic disorder: Secondary | ICD-10-CM

## 2016-02-23 DIAGNOSIS — F1210 Cannabis abuse, uncomplicated: Secondary | ICD-10-CM

## 2016-02-23 DIAGNOSIS — D696 Thrombocytopenia, unspecified: Secondary | ICD-10-CM | POA: Insufficient documentation

## 2016-02-23 LAB — CBC
HCT: 48.2 % (ref 39.0–52.0)
Hemoglobin: 16.4 g/dL (ref 13.0–17.0)
MCH: 29.9 pg (ref 26.0–34.0)
MCHC: 34 g/dL (ref 30.0–36.0)
MCV: 87.8 fL (ref 78.0–100.0)
PLATELETS: 110 10*3/uL — AB (ref 150–400)
RBC: 5.49 MIL/uL (ref 4.22–5.81)
RDW: 13.9 % (ref 11.5–15.5)
WBC: 5.6 10*3/uL (ref 4.0–10.5)

## 2016-02-23 LAB — COMPREHENSIVE METABOLIC PANEL
ALT: 25 U/L (ref 17–63)
ANION GAP: 14 (ref 5–15)
AST: 50 U/L — ABNORMAL HIGH (ref 15–41)
Albumin: 5.3 g/dL — ABNORMAL HIGH (ref 3.5–5.0)
Alkaline Phosphatase: 90 U/L (ref 38–126)
BUN: 10 mg/dL (ref 6–20)
CALCIUM: 9.9 mg/dL (ref 8.9–10.3)
CO2: 27 mmol/L (ref 22–32)
Chloride: 98 mmol/L — ABNORMAL LOW (ref 101–111)
Creatinine, Ser: 1.19 mg/dL (ref 0.61–1.24)
GFR calc non Af Amer: >60 mL/min (ref >60)
Glucose, Bld: 100 mg/dL — ABNORMAL HIGH (ref 65–99)
Potassium: 3.6 mmol/L (ref 3.5–5.1)
SODIUM: 139 mmol/L (ref 135–145)
Total Bilirubin: 1.7 mg/dL — ABNORMAL HIGH (ref 0.3–1.2)
Total Protein: 8 g/dL (ref 6.5–8.1)

## 2016-02-23 LAB — ETHANOL: Alcohol, Ethyl (B): <5 mg/dL (ref <5)

## 2016-02-23 MED ORDER — LORAZEPAM 2 MG/ML IJ SOLN
1.0000 mg | Freq: Once | INTRAMUSCULAR | Status: AC
  Administered 2016-02-23: 1 mg via INTRAMUSCULAR
  Filled 2016-02-23: qty 1

## 2016-02-23 MED ORDER — HALOPERIDOL LACTATE 5 MG/ML IJ SOLN
5.0000 mg | Freq: Once | INTRAMUSCULAR | Status: AC
  Administered 2016-02-23: 5 mg via INTRAMUSCULAR
  Filled 2016-02-23: qty 1

## 2016-02-23 NOTE — ED Notes (Signed)
Mother reports pt bizarre behavior onset Wednesday; denies aggressive or SI behavior. Pt stares in space. Mother continues to report hx of same a year ago with use of "laced weed."

## 2016-02-23 NOTE — ED Notes (Signed)
Attempted to obtain vitals, pt won't talk to staff and is walking up and down the hallway

## 2016-02-23 NOTE — ED Notes (Signed)
Patient noted in room. No complaints, stable, in no acute distress. Q15 minute rounds and monitoring via Security Cameras to continue.  

## 2016-02-23 NOTE — ED Notes (Signed)
Patient noted sleeping in room. No complaints, stable, in no acute distress. Q15 minute rounds and monitoring via Security Cameras to continue.  

## 2016-02-23 NOTE — BH Assessment (Signed)
Assessment completed. Consulted with Maryjean Mornharles Kober, PA-C who recommends observing overnight and evaluating in the morning by psychiatry.    Samuel PokeJoVea Ivori Storr, LCSW Therapeutic Triage Specialist [facility] 02/23/2016 8:52 PM

## 2016-02-23 NOTE — BH Assessment (Addendum)
Assessment Note  Samuel Martin is an 20 y.o. male presenting to [facility] under IVC.    IVC states:  Patient with history psychosis presents to ED with psychosic behavior. He was naked outside today in front of children. Actively hallucinating and responding to internal stimuli.   Patient reports that he came in to the ED "because my head wasn't right." When asked to elaborate, patient states "I just wasn't feeling right." Patient denies SI and history of attempts. Patient denies HI and history of aggression. Patient denies pending charges and upcoming court dates. Patient denies access to firearms and weapons and active probation. Patient denies AVH but does appear to be responding to internal stimuli. Patient pauses after every question is asked and looks from side to side before stating and answer and then mumbles after he answers the questions. Mumbles are undetectable and when asked to repeat what he said patient states "nothing." Patient denies use of drugs and alcohol and UDS needs to be collected. BAL <5 at time of assessment.   Patient is alert and oriented to person place and time. Patient vaguely discusses the situation but seems guarded at times. Patient states his birthday and then states "I just told you it was in November" although this Clinical research associate had not mentioned his birthday previously. Patient does not respond when asked about events from the IVC.  Patient was admitted to [facility] 04/2015 for similar symptoms believe to be induced by substance use. Patient was calm and cooperative and appeared sedated throughout the assessment.    Consulted with Maryjean Morn, PA-C who recommends patient be observed overnight and evaluated in the morning by psychiatry to uphold or rescind IVC.    Diagnosis: Unspecified Psychosis  Past Medical History:  Past Medical History  Diagnosis Date  . Thrombocytopenia (HCC)     History reviewed. No pertinent past surgical history.  Family History: No family  history on file.  Social History:  reports that he has never smoked. He does not have any smokeless tobacco history on file. He reports that he uses illicit drugs (Marijuana). He reports that he does not drink alcohol.  Additional Social History:  Alcohol / Drug Use Pain Medications: See PTA Prescriptions: See PTA Over the Counter: See PTA History of alcohol / drug use?: No history of alcohol / drug abuse (denies)  CIWA: CIWA-Ar BP: 113/57 mmHg Pulse Rate: 93 COWS:    Allergies:  Allergies  Allergen Reactions  . Shellfish-Derived Products Hives and Rash    Home Medications:  (Not in a hospital admission)  OB/GYN Status:  No LMP for male patient.  General Assessment Data Location of Assessment: [facility] TTS Assessment: In system Is this a Tele or Face-to-Face Assessment?: Face-to-Face Is this an Initial Assessment or a Re-assessment for this encounter?: Initial Assessment Marital status: Single Is patient pregnant?: No Pregnancy Status: No Living Arrangements: Parent (father) Can pt return to current living arrangement?: Yes Admission Status: Involuntary Is patient capable of signing voluntary admission?: Yes Referral Source: Self/Family/Friend     Crisis Care Plan Living Arrangements: Parent (father) Name of Psychiatrist: None Name of Therapist: None  Education Status Is patient currently in school?: No Highest grade of school patient has completed: 11th  Risk to self with the past 6 months Suicidal Ideation: No Has patient been a risk to self within the past 6 months prior to admission? : No Suicidal Intent: No Has patient had any suicidal intent within the past 6 months prior to admission? : No Is  patient at risk for suicide?: No Suicidal Plan?: No Has patient had any suicidal plan within the past 6 months prior to admission? : No Access to Means: No What has been your use of drugs/alcohol within the last 12 months?: Denies Previous Attempts/Gestures:  No How many times?: 0 Other Self Harm Risks: None Triggers for Past Attempts: None known Intentional Self Injurious Behavior: None Family Suicide History: No Recent stressful life event(s):  (Denies) Persecutory voices/beliefs?: No Depression: Yes Depression Symptoms: Tearfulness, Loss of interest in usual pleasures Substance abuse history and/or treatment for substance abuse?: No Suicide prevention information given to non-admitted patients: Not applicable  Risk to Others within the past 6 months Homicidal Ideation: No Does patient have any lifetime risk of violence toward others beyond the six months prior to admission? : No Thoughts of Harm to Others: No Current Homicidal Intent: No Current Homicidal Plan: No Access to Homicidal Means: No Identified Victim: Denies History of harm to others?: No Assessment of Violence: None Noted Violent Behavior Description: Denies Does patient have access to weapons?: No Criminal Charges Pending?: No Does patient have a court date: No Is patient on probation?: No  Psychosis Hallucinations: None noted Delusions: None noted  Mental Status Report Appearance/Hygiene: In scrubs Eye Contact: Good Motor Activity: Unable to assess Speech: Soft, Slurred Level of Consciousness: Sedated Mood: Pleasant Affect: Appropriate to circumstance Anxiety Level: None Thought Processes: Relevant Judgement: Impaired Orientation: Person, Place, Time, Situation, Appropriate for developmental age Obsessive Compulsive Thoughts/Behaviors: None  Cognitive Functioning Concentration: Good Memory: Recent Intact, Remote Intact IQ: Average Insight: Good Impulse Control: Fair Appetite: Fair Sleep: No Change Vegetative Symptoms: None  ADLScreening [facility] Assessment Services) Patient's cognitive ability adequate to safely complete daily activities?: Yes Patient able to express need for assistance with ADLs?: Yes Independently performs ADLs?: Yes (appropriate  for developmental age)  Prior Inpatient Therapy Prior Inpatient Therapy: Yes Prior Therapy Dates: 2016 [facility] Reason for Treatment: Substance Indiced Psychosis  Prior Outpatient Therapy Prior Outpatient Therapy: No Prior Therapy Dates: N/A Prior Therapy Facilty/Provider(s): N/A Reason for Treatment: N/A Does patient have an ACCT team?: No Does patient have Intensive In-House Services?  : No Does patient have [facility] services? : No Does patient have P4CC services?: No  ADL Screening (condition at time of admission) Patient's cognitive ability adequate to safely complete daily activities?: Yes Is the patient deaf or have difficulty hearing?: No Does the patient have difficulty seeing, even when wearing glasses/contacts?: No Does the patient have difficulty concentrating, remembering, or making decisions?: No Patient able to express need for assistance with ADLs?: Yes Does the patient have difficulty dressing or bathing?: No Independently performs ADLs?: Yes (appropriate for developmental age) Does the patient have difficulty walking or climbing stairs?: No Weakness of Arms/Hands: None  Home Assistive Devices/Equipment Home Assistive Devices/Equipment: None  Therapy Consults (therapy consults require a physician order) PT Evaluation Needed: No OT Evalulation Needed: No SLP Evaluation Needed: No Abuse/Neglect Assessment (Assessment to be complete while patient is alone) Physical Abuse: Denies Verbal Abuse: Denies Sexual Abuse: Denies Exploitation of patient/patient's resources: Denies Self-Neglect: Denies Values / Beliefs Cultural Requests During Hospitalization: None Spiritual Requests During Hospitalization: None Consults Spiritual Care Consult Needed: No Social Work Consult Needed: No Merchant navy officerAdvance Directives (For Healthcare) Does patient have an advance directive?: No Would patient like information on creating an advanced directive?: No -  patient declined information    Additional Information 1:1 In Past 12 Months?: No CIRT Risk: No Elopement Risk: No Does patient  have medical clearance?: No     Disposition:  Disposition Initial Assessment Completed for this Encounter: Yes Disposition of Patient: Other dispositions (observe overnight and evaluate in the morning ) Other disposition(s): Other (Comment) (per Maryjean Morn, PA-C)  On Site Evaluation by:   Reviewed with Physician:    Keasha Malkiewicz 02/23/2016 9:02 PM

## 2016-02-23 NOTE — ED Notes (Signed)
Pt refusing treatment; IVC paperwork in process.

## 2016-02-23 NOTE — ED Notes (Addendum)
Pt. Transferred to [facility] from ED to room 38. [facility] from [facility] RN. Pt. Oriented to unit including Q15 minute rounds as well as the security cameras for their protection. Patient is alert and oriented, warm and dry in no acute distress. Patient denies SI, HI, and AVH. Pt. Encouraged to let me know if needs arise.

## 2016-02-23 NOTE — ED Provider Notes (Signed)
CSN: [id number]     Arrival date & time 02/23/16  1639 History   First MD Initiated Contact with Patient 02/23/16 1700     Chief Complaint  Patient presents with  . Manic Behavior  . Medical Clearance      The history is provided by a parent. No language interpreter was used.   Samuel Martin is a 20 y.o. male who presents to the Emergency Department complaining of psychiatric evaluation.  Level V caveat due to psychiatric disorder.  History is provided by the patient's mother. He is brought in by his mother for bizarre behavior. He has a history of psychosis one year ago and received inpatient treatment. He is been off of his medications for very long period of time. 2 weeks ago he returned to live with his mother after staying with his grandmother from us the year. 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 She states that he has been acting increasingly bizarre over the last several days. He is often staring into space and jumping around and dancing. Yesterday he was noted to be pacing on a bridge and appeared to be hallucinating. He was brought home by [employer]. Today he was in the front yard and jumping around naked. His mother told him to get dressed and he did go to the porch and put on some clothing. There were many children present at the time when he was naked in the yard. She states that he is often talking to himself and saying that he is not there. She denies any heard suicidal or homicidal comments. She states that earlier today he stated he wanted to go to the hospital to get some help. In the emergency department the patient refuses to answer questions.  Past Medical History  Diagnosis Date  . Thrombocytopenia (HCC)    History reviewed. No pertinent past surgical history. No family history on file. Social History  Substance Use Topics  . Smoking status: Never Smoker   . Smokeless tobacco: None  . Alcohol Use: No    Review  of Systems  Unable to perform ROS: Psychiatric disorder      Allergies  Shellfish-derived products  Home Medications   Prior to Admission medications   Medication Sig Start Date End Date Taking? Authorizing Provider  benztropine (COGENTIN) 0.5 MG tablet Take 1 tablet (0.5 mg) at bedtime: For prevention of drug induced tremors Patient not taking: Reported on 02/23/2016 05/07/15   Sanjuana KavaAgnes I Nwoko, NP  OLANZapine zydis (ZYPREXA) 10 MG disintegrating tablet Take 1 tablet (10 mg total) by mouth every 12 (twelve) hours as needed (Agitation). Patient not taking: Reported on 02/23/2016 05/07/15   Sanjuana KavaAgnes I Nwoko, NP  traZODone (DESYREL) 50 MG tablet Take 1 tablet (50 mg total) by mouth at bedtime. For sleep Patient not taking: Reported on 02/23/2016 05/07/15   Sanjuana KavaAgnes I Nwoko, NP   BP 113/57 mmHg  Pulse 93  Temp(Src) 97.5 F (36.4 C) (Oral)  Resp 18  SpO2 99% Physical Exam  Constitutional: He is oriented to person, place, and time. He appears well-developed and well-nourished.  HENT:  Head: Normocephalic and atraumatic.  Cardiovascular: Normal rate and regular rhythm.   Pulmonary/Chest: Effort normal. No respiratory distress.  Musculoskeletal: Normal range of motion.  Neurological: He is alert and oriented to person, place, and time.  Skin: Skin is warm.  Psychiatric:  Pacing. Mildly agitated. Appears to be responding to stimuli that are  not there. He is minimally verbal and when he is verbal with predominant when talking to himself.  Nursing note and vitals reviewed.   ED Course  Procedures (including critical care time) Labs Review Labs Reviewed  COMPREHENSIVE METABOLIC PANEL - Abnormal; Notable for the following:    Chloride 98 (*)    Glucose, Bld 100 (*)    Albumin 5.3 (*)    AST 50 (*)    Total Bilirubin 1.7 (*)    All other components within normal limits  CBC - Abnormal; Notable for the following:    Platelets 110 (*)    All other components within normal limits  ETHANOL   URINE RAPID DRUG SCREEN, HOSP PERFORMED    Imaging Review No results found. I have personally reviewed and evaluated these images and lab results as part of my medical decision-making.   EKG Interpretation None      MDM   Final diagnoses:  Acute psychosis    Patient here for psychiatric evaluation. He is psychotic on evaluation in the emergency department appears to be responding to internal stimuli. He was committed due to concern for danger to himself and others. He has mild thrombocytopenia that is stable compared to priors. He has been medically cleared for psychiatric treatment. He was given Haldol and Ativan for his mild agitation. He is much calmer following these medications.    Tilden Fossa, MD 02/24/16 [id number]

## 2016-02-24 DIAGNOSIS — F29 Unspecified psychosis not due to a substance or known physiological condition: Secondary | ICD-10-CM

## 2016-02-24 LAB — RAPID URINE DRUG SCREEN, HOSP PERFORMED
AMPHETAMINES: NOT DETECTED
BENZODIAZEPINES: NOT DETECTED
Barbiturates: NOT DETECTED
Cocaine: NOT DETECTED
OPIATES: NOT DETECTED
TETRAHYDROCANNABINOL: POSITIVE — AB

## 2016-02-24 NOTE — ED Notes (Signed)
Patient noted sleeping in room. No complaints, stable, in no acute distress. Q15 minute rounds and monitoring via Security Cameras to continue.  

## 2016-02-24 NOTE — ED Notes (Signed)
Report received from Christine RN. Patient alert and oriented, warm and dry, in no acute distress. Patient denies SI, HI, AVH and pain. Patient made aware of Q15 minute rounds and security cameras for their safety. Patient instructed to come to me with needs or concerns. 

## 2016-02-24 NOTE — ED Notes (Signed)
Patient noted in room. No complaints, stable, in no acute distress. Q15 minute rounds and monitoring via Security Cameras to continue.  

## 2016-02-24 NOTE — ED Notes (Signed)
Pt denies SI and HI, delusions and hallucinations. Sat quietly most of the day and watched TV in the TV room. Conversed with his peers and became insulted when asked if he wanted to hurt himself or anyone else. States "I don't know why I am here. Everything is fine". Is superficial in his responses.

## 2016-02-24 NOTE — Consult Note (Signed)
[facility] Face-to-Face Psychiatry Consult   Reason for Consult:  Bizarre behavior Referring Physician:  EDP Patient Identification: Samuel Martin MRN:  [id number] Principal Diagnosis: Acute psychosis Diagnosis:   Patient Active Problem List   Diagnosis Date Noted  . 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 Marijuana abuse [F12.10] 04/03/2015    Priority: High  . Drug reaction resulting in brief psychotic states [facility]) [F19.159] 05/01/2015  . Psychoses [F29]     Total Time spent with patient: 45 minutes  Subjective:   Samuel Martin is a 20 y.o. male patient admitted with reports of bizarre behavior and responding to internal stimuli as reported on IVC from mother. Hx of similar symptoms 1 year ago requiring inpatient hospitalization. Pt seen and chart reviewed. Pt is alert/oriented x4, calm, cooperative, and appropriate to situation. Pt denies suicidal/homicidal ideation and psychosis. However, pt presents as bizarre and may be responding to internal stimuli. Based on affect and IVC content, pt meets inpatient criteria.   HPI:  I have reviewed and concur with HPI elements below, modified as follows: Samuel Martin is an 20 y.o. male presenting to [facility] under IVC.  IVC States:  Patient with history psychosis presents to ED with psychosic behavior. He was naked outside today in front of children. Actively hallucinating and responding to internal stimuli.   Patient reports that he came in to the ED "because my head wasn't right." When asked to elaborate, patient states "I just wasn't feeling right." Patient denies SI and history of attempts. Patient denies HI and history of aggression. Patient denies pending charges and upcoming court dates. Patient denies access to firearms and weapons and active probation. Patient denies AVH but does appear to be responding to internal stimuli. Patient pauses after every question is asked and looks from side to side before stating and answer and then  mumbles after he answers the questions. Mumbles are undetectable and when asked to repeat what he said patient states "nothing." Patient denies use of drugs and alcohol and UDS needs to be collected. BAL <5 at time of assessment.   Patient is alert and oriented to person place and time. Patient vaguely discusses the situation but seems guarded at times. Patient states his birthday and then states "I just told you it was in November" although this Probation officer had not mentioned his birthday previously. Patient does not respond when asked about events from the IVC. Patient was admitted to [facility] 04/2015 for similar symptoms believe to be induced by substance use. Patient was calm and cooperative and appeared sedated throughout the assessment.   Pt spent the night in the ED without incident and continues to present as unstable. Seen today on 02/24/2016 as above.   Past Psychiatric History: psychosis 1 year ago with [facility] inpatient  Risk to Self: Suicidal Ideation: No Suicidal Intent: No Is patient at risk for suicide?: No Suicidal Plan?: No Access to Means: No What has been your use of drugs/alcohol within the last 12 months?: Denies How many times?: 0 Other Self Harm Risks: None Triggers for Past Attempts: None known Intentional Self Injurious Behavior: None Risk to Others: Homicidal Ideation: No Thoughts of Harm to Others: No Current Homicidal Intent: No Current Homicidal Plan: No Access to Homicidal Means: No Identified Victim: Denies History of harm to others?: No Assessment of Violence: None Noted Violent Behavior Description: Denies Does patient have access to weapons?: No Criminal Charges Pending?: No Does patient have a court date: No Prior Inpatient Therapy: Prior Inpatient  Therapy: Yes Prior Therapy Dates: 2016 Prior Therapy Facilty/Provider(s): [facility] Reason for Treatment: Substance Indiced Psychosis Prior Outpatient Therapy: Prior Outpatient Therapy: No Prior Therapy Dates: N/A Prior  Therapy Facilty/Provider(s): N/A Reason for Treatment: N/A Does patient have an ACCT team?: No Does patient have Intensive In-House Services?  : No Does patient have [facility] services? : No Does patient have P4CC services?: No  Past Medical History:  Past Medical History  Diagnosis Date  . Thrombocytopenia ([place])    History reviewed. No pertinent past surgical history. Family History: No family history on file. Family Psychiatric  History: unknown Social History:  History  Alcohol Use No     History  Drug Use  . Yes  . Special: Marijuana    Social History   Social History  . Marital Status: Single    Spouse Name: N/A  . Number of Children: N/A  . Years of Education: N/A   Social History Main Topics  . Smoking status: Never Smoker   . Smokeless tobacco: None  . Alcohol Use: No  . Drug Use: Yes    Special: Marijuana  . Sexual Activity: Not Asked   Other Topics Concern  . None   Social History Narrative   Additional Social History:    Allergies:   Allergies  Allergen Reactions  . Shellfish-Derived Products Hives and Rash    Labs:  Results for orders placed or performed during the hospital encounter of 02/23/16 (from the past 48 hour(s))  Comprehensive metabolic panel     Status: Abnormal   Collection Time: 02/23/16  4:55 PM  Result Value Ref Range   Sodium 139 135 - 145 mmol/L   Potassium 3.6 3.5 - 5.1 mmol/L   Chloride 98 (L) 101 - 111 mmol/L   CO2 27 22 - 32 mmol/L   Glucose, Bld 100 (H) 65 - 99 mg/dL   BUN 10 6 - 20 mg/dL   Creatinine, Ser 1.19 0.61 - 1.24 mg/dL   Calcium 9.9 8.9 - 10.3 mg/dL   Total Protein 8.0 6.5 - 8.1 g/dL   Albumin 5.3 (H) 3.5 - 5.0 g/dL   AST 50 (H) 15 - 41 U/L   ALT 25 17 - 63 U/L   Alkaline Phosphatase 90 38 - 126 U/L   Total Bilirubin 1.7 (H) 0.3 - 1.2 mg/dL   GFR calc non Af Amer >60 >60 mL/min   GFR calc Af Amer >60 >60 mL/min    Comment: (NOTE) The eGFR has been calculated using the CKD EPI equation. This  calculation has not been validated in all clinical situations. eGFR's persistently <60 mL/min signify possible Chronic Kidney Disease.    Anion gap 14 5 - 15  cbc     Status: Abnormal   Collection Time: 02/23/16  4:55 PM  Result Value Ref Range   WBC 5.6 4.0 - 10.5 K/uL   RBC 5.49 4.22 - 5.81 MIL/uL   Hemoglobin 16.4 13.0 - 17.0 g/dL   HCT 48.2 39.0 - 52.0 %   MCV 87.8 78.0 - 100.0 fL   MCH 29.9 26.0 - 34.0 pg   MCHC 34.0 30.0 - 36.0 g/dL   RDW 13.9 11.5 - 15.5 %   Platelets 110 (L) 150 - 400 K/uL    Comment: SPECIMEN CHECKED FOR CLOTS REPEATED TO VERIFY PLATELET COUNT CONFIRMED BY SMEAR LARGE PLATELETS PRESENT   Ethanol     Status: None   Collection Time: 02/23/16  4:56 PM  Result Value Ref Range   Alcohol, Ethyl (  B) <5 <5 mg/dL    Comment:        LOWEST DETECTABLE LIMIT FOR SERUM ALCOHOL IS 5 mg/dL FOR MEDICAL PURPOSES ONLY     No current facility-administered medications for this encounter.   Current Outpatient Prescriptions  Medication Sig Dispense Refill  . benztropine (COGENTIN) 0.5 MG tablet Take 1 tablet (0.5 mg) at bedtime: For prevention of drug induced tremors (Patient not taking: Reported on 02/23/2016) 30 tablet 0  . OLANZapine zydis (ZYPREXA) 10 MG disintegrating tablet Take 1 tablet (10 mg total) by mouth every 12 (twelve) hours as needed (Agitation). (Patient not taking: Reported on 02/23/2016) 60 tablet 0  . traZODone (DESYREL) 50 MG tablet Take 1 tablet (50 mg total) by mouth at bedtime. For sleep (Patient not taking: Reported on 02/23/2016) 30 tablet 0    Musculoskeletal: Strength & Muscle Tone: within normal limits Gait & Station: normal Patient leans: N/A  Psychiatric Specialty Exam: Review of Systems  Psychiatric/Behavioral: Positive for depression and hallucinations. The patient is nervous/anxious.   All other systems reviewed and are negative.   Blood pressure 148/97, pulse 92, temperature 98 F (36.7 C), temperature source Oral, resp. rate  16, SpO2 100 %.There is no weight on file to calculate BMI.  General Appearance: Bizarre   Eye Contact::  Fair   Speech:  Clear and Coherent and Normal Rate   Volume:  Normal   Mood:  Anxious and Depressed   Affect:  Appropriate and Congruent   Thought Process:  Loose   Orientation:  Full (Time, Place, and Person)   Thought Content:  Symptoms, worries, concerns   Suicidal Thoughts:  No  Homicidal Thoughts:  No  Memory:  Immediate;   Fair Recent;   Fair Remote;   Fair  Judgement:  Fair  Insight:  Fair  Psychomotor Activity:  Normal  Concentration:  Fair  Recall:  [employer] of Knowledge:Fair  Language: Fair  Akathisia:  No  Handed:    AIMS (if indicated):     Assets:  Communication Skills Desire for Improvement Resilience Social Support  ADL's:  Intact  Cognition: WNL  Sleep:      Treatment Plan Summary: Acute psychosis, unstable, meets inpatient criteria  Medications: -Currently under evaluation by Dr. Darleene Cleaver  Disposition: Recommend psychiatric Inpatient admission when medically cleared.  Benjamine Mola, FNP 02/24/2016 12:00 PM Patient seen face-to-face for psychiatric evaluation, chart reviewed and case discussed with the physician extender and developed treatment plan. Reviewed the information documented and agree with the treatment plan. Corena Pilgrim, MD

## 2016-02-25 ENCOUNTER — Inpatient Hospital Stay (HOSPITAL_COMMUNITY)
Admission: AD | Admit: 2016-02-25 | Discharge: 2016-02-28 | DRG: 897 | Disposition: A | Discharge status: Home / Self Care | Payer: No Typology Code available for payment source | Attending: Psychiatry | Admitting: Psychiatry

## 2016-02-25 ENCOUNTER — Encounter (HOSPITAL_COMMUNITY): Payer: Self-pay | Admitting: *Deleted

## 2016-02-25 DIAGNOSIS — F12251 Cannabis dependence with psychotic disorder with hallucinations: Secondary | ICD-10-CM | POA: Diagnosis present

## 2016-02-25 DIAGNOSIS — F12951 Cannabis use, unspecified with psychotic disorder with hallucinations: Secondary | ICD-10-CM | POA: Diagnosis present

## 2016-02-25 DIAGNOSIS — F1220 Cannabis dependence, uncomplicated: Secondary | ICD-10-CM | POA: Diagnosis present

## 2016-02-25 DIAGNOSIS — F23 Brief psychotic disorder: Secondary | ICD-10-CM | POA: Diagnosis present

## 2016-02-25 DIAGNOSIS — F29 Unspecified psychosis not due to a substance or known physiological condition: Secondary | ICD-10-CM | POA: Diagnosis not present

## 2016-02-25 MED ORDER — OLANZAPINE 10 MG PO TBDP
10.0000 mg | ORAL_TABLET | Freq: Two times a day (BID) | ORAL | Status: DC
  Administered 2016-02-25: 10 mg via ORAL
  Filled 2016-02-25: qty 1

## 2016-02-25 MED ORDER — ALUM & MAG HYDROXIDE-SIMETH 200-200-20 MG/5ML PO SUSP: 30.0000 mL | ORAL | Status: DC | PRN

## 2016-02-25 MED ORDER — MAGNESIUM HYDROXIDE 400 MG/5ML PO SUSP: 30.0000 mL | Freq: Every day | ORAL | Status: DC | PRN

## 2016-02-25 MED ORDER — ACETAMINOPHEN 325 MG PO TABS
650.0000 mg | ORAL_TABLET | Freq: Four times a day (QID) | ORAL | Status: DC | PRN
Start: 2016-02-25 — End: 2016-03-01

## 2016-02-25 MED ORDER — OLANZAPINE 10 MG PO TBDP
10.0000 mg | ORAL_TABLET | Freq: Two times a day (BID) | ORAL | Status: DC
  Administered 2016-02-26: 10 mg via ORAL
  Filled 2016-02-25 (×4): qty 1

## 2016-02-25 NOTE — ED Notes (Signed)
Patient noted sleeping in room. No complaints, stable, in no acute distress. Q15 minute rounds and monitoring via Security Cameras to continue.  

## 2016-02-25 NOTE — BH Assessment (Signed)
Patient has been referred to the following facilities for consideration:  [facility] - [phone] [facility] - [phone] [facility] - [phone] [facility] - [phone] [facility] - [phone] [facility] [phone]- [phone]  The following facilities are at capacity:  [facility]   Samuel PokeJoVea Izabellah Dadisman, [place] Therapeutic Triage Specialist [facility] 02/25/2016 4:21 AM

## 2016-02-25 NOTE — Tx Team (Signed)
Initial Interdisciplinary Treatment Plan   PATIENT STRESSORS: Marital or family conflict Substance abuse   PATIENT STRENGTHS: Physical Health Supportive family/friends   PROBLEM LIST: Problem List/Patient Goals Date to be addressed Date deferred Reason deferred Estimated date of resolution  Substance abuse 02/25/16     Depression 02/25/16     "Getting out of here" 02/25/16     "Gaining weight" 02/25/16                                    DISCHARGE CRITERIA:  Ability to meet basic life and health needs Improved stabilization in mood, thinking, and/or behavior Motivation to continue treatment in a less acute level of care  PRELIMINARY DISCHARGE PLAN: Attend aftercare/continuing care group Attend PHP/IOP Attend 12-step recovery group  PATIENT/FAMIILY INVOLVEMENT: This treatment plan has been presented to and reviewed with the patient, Su HiltRahshon Knodel, and/or family member.  The patient and family have been given the opportunity to ask questions and make suggestions.  Bethann PunchesJane O Shelly Shoultz 02/25/2016, 4:43 PM

## 2016-02-25 NOTE — ED Notes (Signed)
Patient noted in hall. No complaints, stable, in no acute distress. Q15 minute rounds and monitoring via Security Cameras to continue. 

## 2016-02-25 NOTE — BHH Group Notes (Signed)
Adult Psychoeducational Group Note  Date:  02/25/2016 Time:  9:07 PM  Group Topic/Focus:  Wrap-Up Group:   The focus of this group is to help patients review their daily goal of treatment and discuss progress on daily workbooks.  Participation Level:  Minimal  Participation Quality:  Attentive  Affect:  Flat  Cognitive:  Alert  Insight: Good  Engagement in Group:  Limited  Modes of Intervention:  Discussion  Additional Comments:  Pt is a new admit.  He rated his day a 6.  Pt expressed that his day was "straight".   Samuel Martin, Samuel Martin A 02/25/2016, 9:07 PM

## 2016-02-25 NOTE — ED Notes (Signed)
Pt smiles upon approach. Pt has been restless in and out of his room and doing push ups in his room this morning. He denies si, hi and hallucinations. Safety maintained in the SAPPU.
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
## 2016-02-25 NOTE — Progress Notes (Signed)
Admission Note:  20 year old male who presents IVC in no acute distress for the treatment of disorganized thinking and psychosis.  Patient appears animated. Patient was calm, pleasant, and cooperative with admission process.  Patient reports that he does not know why he is being admitted "I was sitting on the porch and my mom said lets go to the hospital so I did".  Per report, patient was IVC'd by his mother after demonstrating "bizarre behavior" and "responding to internal stimuli".  Patient did not appear to be responding to internal stimuli during admission.  Patient currently denies SI/HI/AVH. [facility]  Patient reports smoking "a Black&Mild twice a week".  Patient denies drug and alcohol use.  Patient denies hx of physical or sexual abuse.  Patient currently lives with his mother and 4 siblings.  Patient identifies his mother as his support system.  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  Patient would like to work on "getting out of here" and "gaining weight" while [facility].   Skin was assessed.  Patient had healing scars allover body.  Patient does not know where scars came from.  Patient searched and no contraband found, POC and unit policies explained and understanding verbalized. Consents obtained. Food and fluids offered.  Report given to accepting nurse.  Patient placed on q 15 minute observation. Patient had no additional questions or concerns.  Safety maintained on unit.

## 2016-02-25 NOTE — BH Assessment (Signed)
BHH Assessment Progress Note  Per Samuel MinsMojeed Akintayo, MD, this pt requires psychiatric hospitalization at this time.  Samuel Heinrichina Tate, RN, [facility]; they will be ready to receive pt at 14:00.  Pt presents under IVC, and [facility]C [facility].  Pt's nurse, Jan, has been notified, and agrees to call report to [phone].  Pt is to be transported via Patent examinerlaw enforcement.  Samuel Canninghomas Izick Gasbarro, MA Triage Specialist [phone]

## 2016-02-25 NOTE — BH Assessment (Signed)
BHH Assessment Progress Note Patient presents this date being very disorganized in his thinking although he denies any S/I or H/I. Patient is alert but seems to be responding to internal stimuli as patient interacted with this Clinical research associatewriter. Patient found it difficult to answer this writer's questions and was answering questions that this writer was not asking. Patient would walk away from this writer and continue to talk incoherently with this writer not being able to redirect him. Patient continues to meet inpatient criteria per [facility] and [facility]00 hrs.

## 2016-02-26 ENCOUNTER — Encounter (HOSPITAL_COMMUNITY): Payer: Self-pay | Admitting: Psychiatry

## 2016-02-26 DIAGNOSIS — F29 Unspecified psychosis not due to a substance or known physiological condition: Secondary | ICD-10-CM

## 2016-02-26 MED ORDER — LORAZEPAM 0.5 MG PO TABS
0.5000 mg | ORAL_TABLET | Freq: Four times a day (QID) | ORAL | Status: DC | PRN
  Administered 2016-02-27 – 2016-02-28 (×2): 0.5 mg via ORAL
  Filled 2016-02-26 (×2): qty 1

## 2016-02-26 MED ORDER — OLANZAPINE 5 MG PO TBDP
5.0000 mg | ORAL_TABLET | Freq: Two times a day (BID) | ORAL | Status: DC
  Administered 2016-02-26 – 2016-02-28 (×4): 5 mg via ORAL
  Filled 2016-02-26: qty 14
  Filled 2016-02-26: qty 1
  Filled 2016-02-26 (×2): qty 14
  Filled 2016-02-26 (×6): qty 1
  Filled 2016-02-26: qty 14

## 2016-02-26 NOTE — H&P (Signed)
Psychiatric Admission Assessment Adult  Patient Identification: Samuel Martin MRN:  [id number] Date of Evaluation:  02/26/2016 Chief Complaint:  UNSPECIFIED PSYCHOSIS Principal Diagnosis: Psychosis Diagnosis:   Patient Active Problem List   Diagnosis Date Noted  . Psychosis [F29] 02/25/2016    Priority: High  . Drug reaction resulting in brief psychotic states [facility]) [F19.159] 05/01/2015  . 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 Acute psychosis [F29] 04/03/2015  . Marijuana abuse [F12.10] 04/03/2015   History of Present Illness:  Samuel Martin is an 20 y.o. male presenting to [facility] under IVC as he presents to ED with psychosic behavior.  He was brought to hospital by his mother due to patient exhibiting concern regarding bizarre behaviors, such as dancing alone , talking to self, staring into space.  It was also in the chart record that he was naked outside today in front of children. Actively hallucinating and responding to internal stimuli.  Patient is alert and oriented to person place and time. Patient vaguely discusses the situation but seems guarded at times.  Patient was admitted to [facility] 04/2015 for similar symptoms believe to be induced by substance use. Patient was calm and cooperative and appeared sedated throughout the assessment.   Patient smokes cannabis daily.  Denies alcohol or any other drug abuse.  He has one prior psychiatric admission in July of 2016 for similar presentation ( psychosis) , which at the time was felt to be drug related . At the time he stabilized on Zyprexa.  Associated Signs/Symptoms: Depression Symptoms:  depressed mood, difficulty concentrating, anxiety, (Hypo) Manic Symptoms:  Irritable Mood, Labiality of Mood, Anxiety Symptoms:  Excessive Worry, Psychotic Symptoms:  NA PTSD Symptoms: NA Total Time spent with patient: 30 minutes  Past Psychiatric History: see above noted  Is the patient at risk to self? Yes.    Has the patient been a risk to self in the past 6  months? Yes.    Has the patient been a risk to self within the distant past? Yes.    Is the patient a risk to others? No.  Has the patient been a risk to others in the past 6 months? No.  Has the patient been a risk to others within the distant past? No.   Prior Inpatient Therapy:   Prior Outpatient Therapy:    Alcohol Screening: 1. How often do you have a drink containing alcohol?: Never 9. Have you or someone else been injured as a result of your drinking?: No 10. Has a relative or friend or a doctor or another health worker been concerned about your drinking or suggested you cut down?: No Alcohol Use Disorder Identification Test Final Score (AUDIT): 0 Brief Intervention: AUDIT score less than 7 or less-screening does not suggest unhealthy drinking-brief intervention not indicated Substance Abuse History in the last 12 months:  Yes.   Consequences of Substance Abuse: NA Previous Psychotropic Medications: Yes  Psychological Evaluations: Yes  Past Medical History:  Past Medical History  Diagnosis Date  . Thrombocytopenia (HCC)    History reviewed. No pertinent past surgical history. Family History: History reviewed. No pertinent family history. Family Psychiatric  History: Denied Tobacco Screening: @FLOW ([phone])::1)@ Social History:  History  Alcohol Use No     History  Drug Use  . Yes  . Special: Marijuana    Additional Social History:  Allergies:   Allergies  Allergen Reactions  . Shellfish-Derived Products Hives and Rash   Lab Results:  Results for orders placed or performed during the hospital encounter of  02/23/16 (from the past 48 hour(s))  Rapid urine drug screen (hospital performed)     Status: Abnormal   Collection Time: 02/24/16  5:46 PM  Result Value Ref Range   Opiates NONE DETECTED NONE DETECTED   Cocaine NONE DETECTED NONE DETECTED   Benzodiazepines NONE DETECTED NONE DETECTED   Amphetamines NONE DETECTED NONE DETECTED   Tetrahydrocannabinol  POSITIVE (A) NONE DETECTED   Barbiturates NONE DETECTED NONE DETECTED    Comment:        DRUG SCREEN FOR MEDICAL PURPOSES ONLY.  IF CONFIRMATION IS NEEDED FOR ANY PURPOSE, NOTIFY LAB WITHIN 5 DAYS.        LOWEST DETECTABLE LIMITS FOR URINE DRUG SCREEN Drug Class       Cutoff (ng/mL) Amphetamine      1000 Barbiturate      200 Benzodiazepine   200 Tricyclics       300 Opiates          300 Cocaine          300 THC              50     Blood Alcohol level:  Lab Results  Component Value Date   ETH <5 02/23/2016   ETH <5 04/28/2015    Metabolic Disorder Labs:  No results found for: HGBA1C, MPG No results found for: PROLACTIN No results found for: CHOL, TRIG, HDL, CHOLHDL, VLDL, LDLCALC  Current Medications: Current Facility-Administered Medications  Medication Dose Route Frequency Provider Last Rate Last Dose  . acetaminophen (TYLENOL) tablet 650 mg  650 mg Oral Q6H PRN Charm RingsJamison Y Lord, NP      . alum & mag hydroxide-simeth (MAALOX/MYLANTA) 200-200-20 MG/5ML suspension 30 mL  30 mL Oral Q4H PRN Charm RingsJamison Y Lord, NP      . LORazepam (ATIVAN) tablet 0.5 mg  0.5 mg Oral Q6H PRN Craige CottaFernando A Cobos, MD      . magnesium hydroxide (MILK OF MAGNESIA) suspension 30 mL  30 mL Oral Daily PRN Charm RingsJamison Y Lord, NP      . OLANZapine zydis (ZYPREXA) disintegrating tablet 5 mg  5 mg Oral BID Craige CottaFernando A Cobos, MD       PTA Medications: Prescriptions prior to admission  Medication Sig Dispense Refill Last Dose  . benztropine (COGENTIN) 0.5 MG tablet Take 1 tablet (0.5 mg) at bedtime: For prevention of drug induced tremors (Patient not taking: Reported on 02/23/2016) 30 tablet 0 Not Taking at Unknown time  . OLANZapine zydis (ZYPREXA) 10 MG disintegrating tablet Take 1 tablet (10 mg total) by mouth every 12 (twelve) hours as needed (Agitation). (Patient not taking: Reported on 02/23/2016) 60 tablet 0 Not Taking at Unknown time  . traZODone (DESYREL) 50 MG tablet Take 1 tablet (50 mg total) by mouth at  bedtime. For sleep (Patient not taking: Reported on 02/23/2016) 30 tablet 0 Not Taking at Unknown time    Musculoskeletal: Strength & Muscle Tone: within normal limits Gait & Station: normal Patient leans: N/A  Psychiatric Specialty Exam: Physical Exam  Vitals reviewed. Psychiatric: His mood appears anxious. He exhibits a depressed mood.    Review of Systems  Psychiatric/Behavioral: Positive for depression. Negative for hallucinations.  All other systems reviewed and are negative.   Blood pressure 125/85, pulse 70, temperature 97.7 F (36.5 C), temperature source Oral, resp. rate 18, height 5\' 10"  (1.778 m), weight 79.833 kg (176 lb), SpO2 100 %.Body mass index is 25.25 kg/(m^2).   General Appearance: Bizarre   Eye Contact:: Fair  Speech: Clear and Coherent and Normal Rate   Volume: Normal   Mood: Anxious and Depressed   Affect: Appropriate and Congruent   Thought Process: Loose   Orientation: Full (Time, Place, and Person)   Thought Content: Symptoms, worries, concerns   Suicidal Thoughts: No  Homicidal Thoughts: No  Memory: Immediate; Fair Recent; Fair Remote; Fair  Judgement: Fair  Insight: Fair  Psychomotor Activity: Normal  Concentration: Fair  Recall: [employer] of Knowledge:Fair  Language: Fair  Akathisia: No  Handed:   AIMS (if indicated):    Assets: Communication Skills Desire for Improvement Resilience Social Support  ADL's: Intact  Cognition: WNL  Sleep:         Treatment Plan Summary: Admit for crisis management and mood stabilization. Medication management to re-stabilize current mood symptoms Group counseling sessions for coping skills Medical consults as needed Review and reinstate any pertinent home medications for other health problems    Observation Level/Precautions:  15 minute checks  Laboratory:  per ED  Psychotherapy:   group  Medications:   start Zyprexa Zydis 5 mgrs BID   Mood symptoms  Consultations:  As needed  Discharge Concerns:  safety  Estimated LOS:  2-7 days  Other:     I certify that inpatient services furnished can reasonably be expected to improve the patient's condition.    [facility], NP [facility] 5/16/20174:07 PM Patient case reviewed with NP and patient seen by me Agree with NP note and assessment 20 year old single male, lives with mother, mother's BF, and his siblings. 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 Patient minimizes above but does state " I know I have been acting kind of weird, but most of it is exaggeration". Denies depression, denies any suicidal ideations. He states he has been smoking cannabis almost daily. Denies alcohol or any other drug abuse . He has one prior psychiatric admission in July of 2016 for similar presentation ( psychosis) , which at the time was felt to be drug related . At the time he stabilized on Zyprexa. Denies any medical illnesses . Dx- Psychosis, consider substance induced  Plan- inpatient psychiatric admission , start Zyprexa Zydis 5 mgrs BID

## 2016-02-26 NOTE — [facility] Suicide Risk Assessment (Signed)
[facility] INPATIENT:  Family/Significant Other Suicide Prevention Education  Suicide Prevention Education:  Education Completed; No one has been identified by the patient as the family member/significant other with whom the patient will be residing, and identified as the person(s) who will aid the patient in the event of a mental health crisis (suicidal ideations/suicide attempt).  With written consent from the patient, the family member/significant other has been provided the following suicide prevention education, prior to the and/or following the discharge of the patient.  The suicide prevention education provided includes the following:  Suicide risk factors  Suicide prevention and interventions  National Suicide Hotline telephone number  [facility] assessment telephone number  [place] Emergency Assistance 911  [facility] and/or [facility]idential Mobile Crisis [facility] telephone number  Request made of family/significant other to:  Remove weapons (e.g., guns, rifles, knives), all items previously/currently identified as safety concern.    Remove drugs/medications (over-the-counter, prescriptions, illicit drugs), all items previously/currently identified as a safety concern.  The family member/significant other verbalizes understanding of the suicide prevention education information provided.  The family member/significant other agrees to remove the items of safety concern listed above.The patient did not endorse SI at the time of admission, nor did the patient c/o SI during the stay here.  SPE not required. However, i did talk to mother, Ms Sherrie MustacheFisher, [phone] and we went over crises plan.  [name], Aloura Matsuoka B 02/26/2016, 4:26 PM

## 2016-02-26 NOTE — Progress Notes (Signed)
Patient up and visible in the milieu. Smiling inappropriately at times though denies AVH. Rates depression, hopelessness and anxiety all at a 0/10. Reports goal is "working to fix my life so I can be a better man" and "to find myself." Denies pain, physical complaints. Medicated without difficulty. Emotional support offered. Self inventory reviewed. Asked patient whether he felt he needed to be here and patient stated, "not really." Asked if he had med compliant and patient stated, "I wasn't given any meds to take." He denies SI/HI and remains safe on level III obs.

## 2016-02-26 NOTE — [facility] Suicide Risk Assessment (Signed)
[facility] Admission Suicide Risk Assessment   Nursing information obtained from:  Patient Demographic factors:  Adolescent or young adult Current Mental Status:  NA Loss Factors:  NA Historical Factors:  NA Risk Reduction Factors:  Sense of responsibility to family, Living with another person, especially a relative, Positive social support  Total Time spent with patient: 45 minutes Principal Problem:  Psychosis , Consider Substance Induced/ Cannabis Dependence   Diagnosis:   Patient Active Problem List   Diagnosis Date Noted  . Psychosis [F29] 02/25/2016  . Drug reaction resulting in brief psychotic states [facility]) [F19.159] 05/01/2015  . 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 Acute psychosis [F29] 04/03/2015  . Marijuana abuse [F12.10] 04/03/2015     Continued Clinical Symptoms:  Alcohol Use Disorder Identification Test Final Score (AUDIT): 0 The "Alcohol Use Disorders Identification Test", Guidelines for Use in Primary Care, Second Edition.  World Science writer [facility]). Score between 0-7:  no or low risk or alcohol related problems. Score between 8-15:  moderate risk of alcohol related problems. Score between 16-19:  high risk of alcohol related problems. Score 20 or above:  warrants further diagnostic evaluation for alcohol dependence and treatment.   CLINICAL FACTORS:   20 year old single male, lives with mother, mother's BF, and his siblings. 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  Patient minimizes above but does state " I know I have been acting kind of weird, but most of it is exaggeration". Denies depression, denies any suicidal ideations. He states he has been smoking cannabis almost daily. Denies alcohol or any other drug abuse . He has one prior psychiatric admission in July of 2016 for similar presentation ( psychosis) , which at  the time was felt to be drug related . At the time he stabilized on Zyprexa. Denies any medical illnesses . Dx- Psychosis, consider substance induced  Plan- inpatient psychiatric admission , start Zyprexa Zydis 5 mgrs BID     Musculoskeletal: Strength & Muscle Tone: within normal limits Gait & Station: normal Patient leans: N/A  Psychiatric Specialty Exam: ROS denies headache, denies chest pain, denies shortness of breath, denies vomiting, no rash  Blood pressure 125/85, pulse 70, temperature 97.7 F (36.5 C), temperature source Oral, resp. rate 18, height  (1.778 m), weight 176 lb (79.833 kg), SpO2 100 %.Body mass index is 25.25 kg/(m^2).  General Appearance: Fairly Groomed  Patent attorney::  Good  Speech:  Normal Rate  Volume:  Decreased  Mood:  denies depression at this time  Affect:  slightly guarded, but does smile at times   Thought Process:  generally linear, but answers questions with mono syllables and short phrases   Orientation:  Other:  alert and oriented x 3   Thought Content:  denies hallucinations, and does not appear internally preoccupied, no delusions expressed   Suicidal Thoughts:  No denies any suicidal or self injurious ideations, denies any homicidal or violent ideations   Homicidal Thoughts:  No  Memory:  recent and remote grossly intact   Judgement:  Fair  Insight:  Fair  Psychomotor Activity:  Normal- no psychomotor agitation, no restlessness, no symptoms of WDL   Concentration:  Good  Recall:  Good  Fund of Knowledge:Good  Language: Good  Akathisia:  Negative  Handed:  Right  AIMS (if indicated):     Assets:  Desire for Improvement Resilience  Sleep:  Number of Hours: 6.75  Cognition: WNL  ADL's:  Intact    COGNITIVE FEATURES THAT CONTRIBUTE TO RISK:  Loss of executive function    SUICIDE RISK:   Moderate:  Frequent suicidal ideation with limited intensity, and duration, some specificity in terms of plans, no associated intent, good  self-control, limited dysphoria/symptomatology, some risk factors present, and identifiable protective factors, including available and accessible social support.  PLAN OF CARE: Patient will be admitted to inpatient psychiatric unit for stabilization and safety. Will provide and encourage milieu participation. Provide medication management and maked adjustments as needed.  Will follow daily.    I certify that inpatient services furnished can reasonably be expected to improve the patient's condition.   Nehemiah MassedOBOS, Shyniece Scripter, MD 02/26/2016, 12:57 PM

## 2016-02-26 NOTE — [facility] Group Notes (Signed)
[facility] Group Notes:  (Nursing/MHT/Case Management/Adjunct)  Date:  02/26/2016  Time:  0930   Type of Therapy:  Nurse Education - Ways to Promote Recovery  Participation Level:  Minimal  Participation Quality:  Attentive  Affect:  Excited  Cognitive:  Alert  Insight:  Lacking  Engagement in Group:  Engaged  Modes of Intervention:  Discussion, Education and Support  Summary of Progress/Problems: Patient attended group and was attentive. Smiling throughout. Chose not to share anything.  Merian CapronFriedman, Mouhamed Glassco [facility] 02/26/2016, 11:39 AM

## 2016-02-26 NOTE — [facility] Group Notes (Signed)
[facility] LCSW Group Therapy  02/26/2016 , 1:14 PM   Type of Therapy:  Group Therapy  Participation Level:  Active  Participation Quality:  Attentive  Affect:  Appropriate  Cognitive:  Alert  Insight:  Improving  Engagement in Therapy:  Engaged  Modes of Intervention:  Discussion, Exploration and Socialization  Summary of Progress/Problems: Today's group focused on the term Diagnosis.  Participants were asked to define the term, and then pronounce whether it is a negative, positive or neutral term. Stayed the entire time, attentive throughout.  Chose to not participate.  CSW was exploring the topic with him, and another patient jumped in to protect him from further questioning. Samuel Martin, Samuel Martin 02/26/2016 , 1:14 PM

## 2016-02-26 NOTE — Progress Notes (Signed)
Adult Psychoeducational Group Note  Date:  02/26/2016 Time:  8:56 PM  Group Topic/Focus:  Wrap-Up Group:   The focus of this group is to help patients review their daily goal of treatment and discuss progress on daily workbooks.  Participation Level:  Active  Participation Quality:  Appropriate  Affect:  Appropriate  Cognitive:  Appropriate  Insight: Appropriate  Engagement in Group:  Engaged  Modes of Intervention:  Discussion  Additional Comments: The patient expressed that he attended groups.The patient also said he rates today 7 which is good.  Octavio Mannshigpen, Vern Prestia Lee 02/26/2016, 8:56 PM

## 2016-02-26 NOTE — Progress Notes (Signed)
D:Patient in his room on approach.  Patient states he had a good day.  Patient has inappropriate smile.  Patient states his mother is the reason he is here.  Patient states he feels better and feels ready to go home.  Patient does not engage in conversation with Clinical research associatewriter.  Patient denies SI/HI and denies AVH.   A: Staff to monitor Q 15 mins for safety.  Encouragement and support offered.  Scheduled medications administered per orders. R: Patient remains safe on the unit.  Patient attended group tonight.  Patient visible on the unit and interacting with peers.  Patient taking administered medications

## 2016-02-26 NOTE — Progress Notes (Signed)
D: Pt is isolative and withdrawn to self. Pt denies depression, anxiety, pain, SI, HI or AVH; Pt can however be observed laughing as if to be responding to some internal stimuli. Pt was pleasant and cooperative. A: Support, encouragement, and safe environment provided.  15-minute safety checks continue. R: Safety checks continue.

## 2016-02-27 LAB — LIPID PANEL
Cholesterol: 113 mg/dL (ref 0–200)
HDL: 39 mg/dL — AB (ref >40)
LDL Cholesterol: 51 mg/dL (ref 0–99)
TRIGLYCERIDES: 117 mg/dL (ref <150)
Total CHOL/HDL Ratio: 2.9 RATIO
VLDL: 23 mg/dL (ref 0–40)

## 2016-02-27 LAB — TSH: TSH: 1.451 u[IU]/mL (ref 0.350–4.500)

## 2016-02-27 NOTE — Progress Notes (Signed)
D:Patient at the nurses station on first approach.  Patient states he had a good day.  Patient states he has been compliant with his medications.  Patient states he is hopeful that he will be discharged soon.  Patient denies SI/HI and denies AVH. A: Staff to monitor Q 15 mins for safety.  Encouragement and support offered.  No scheduled medications administered per orders. R: Patient remains safe on the unit.  Patient attended group tonight.  Patient visible on the unit and interacting with peers.  Patient had no medications to administer.

## 2016-02-27 NOTE — Progress Notes (Signed)
Patient ID: Samuel Martin, male   DOB: 04/10/1996, 20 y.o.   MRN: [id number]  DAR: Pt. Denies SI/HI and A/V Hallucinations. He reports sleep is good, appetite is good, energy level is normal, and concentration is good. He rates depression, anxiety, and hopelessness 0/10. Patient does not report any pain or discomfort at this time. Support and encouragement provided to the patient. Scheduled medications administered to patient per physician's orders. Patient is minimal and forwards very little to this Clinical research associatewriter. He appears to be minimizing at this time but not outwardly responding to internal stimuli. He is seen in the hallway interacting with a peer appropriately earlier, and is also seen in the dayroom at times. He has not had any behavioral issues on this shift thus far and has not needed PRN's. Q15 minute checks are maintained for safety.

## 2016-02-27 NOTE — Tx Team (Signed)
Interdisciplinary Treatment Plan Update (Adult)  Date:  02/27/2016   Time Reviewed:  8:42 AM   Progress in Treatment: Attending groups: Yes. Participating in groups:  Minimally Taking medication as prescribed:  Yes. Tolerating medication:  Yes. Family/Significant other contact made:  Yes Patient understands diagnosis: No  Limited insight Discussing patient identified problems/goals with staff:  Yes, see initial care plan. Medical problems stabilized or resolved:  Yes. Denies suicidal/homicidal ideation: Yes. Issues/concerns per patient self-inventory:  No. Other:  New problem(s) identified:  Discharge Plan or Barriers: see below  Reason for Continuation of Hospitalization: Other; describe Paranoia, Disorganization  Medication management  Comments:  Patient with history psychosis presents to ED with psychosic behavior. He was naked outside today in front of children. Actively hallucinating and responding to internal stimuli.   Patient reports that he came in to the ED "because my head wasn't right." When asked to elaborate, patient states "I just wasn't feeling right." Patient denies SI and history of attempts. Patient denies HI and history of aggression. Patient minimizes symptoms but does state " I know I have been acting kind of weird, but most of it is exaggeration". Denies depression, denies any suicidal ideations. He states he has been smoking cannabis almost daily. He has one prior psychiatric admission in July of 2016 for similar presentation ( psychosis) , which at the time was felt to be drug related . At the time he was stabilized on Zyprexa. 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 Goal(s): Patient will participate in  aftercare plan   Met: Yes   Target date: 3-5 days post admission date   As evidenced by: Patient will participate within aftercare plan AEB aftercare provider and housing plan at discharge being identified. 02/27/16:  Return home with mother, follow up outpt        4. Goal(s): Patient will demonstrate decreased signs of withdrawal due to substance abuse   Met: Yes   Target date: 3-5 days post admission date   As evidenced by: Patient will produce a CIWA/COWS score of 0, have stable vitals signs, and no symptoms of withdrawal 02/27/16:  No longer exhibiting psychosis.  Admits to smoking cannabis, but minimizes-"once in a blue moon."    5. Goal(s): Patient will demonstrate decreased signs of psychosis  * Met: No  * Target date: 3-5 days post admission date  * As evidenced by: Patient will demonstrate decreased frequency of AVH or return to baseline function 02/27/16:  Had not been attending oupt appointments nor taking meds since last admission.  Exhibiting confusion, disorganization with bizarre thoughts and behavior prior to admission.          Attendees: Patient:  02/27/2016 8:42 AM   Family:   02/27/2016 8:42 AM   Physician:  Ursula Alert, MD 02/27/2016 8:42 AM   Nursing:   Gaylan Gerold, RN 02/27/2016 8:42 AM   CSW:    Roque Lias, LCSW   02/27/2016 8:42 AM   Other:  02/27/2016 8:42 AM   Other:   02/27/2016 8:42 AM   Other:  Lars Pinks, Nurse CM 02/27/2016 8:42 AM   Other:   02/27/2016 8:42 AM   Other:  Norberto Sorenson, [place]  02/27/2016 8:42 AM   Other:  02/27/2016 8:42 AM  Other:  02/27/2016 8:42 AM   Other:  02/27/2016 8:42 AM   Other:  02/27/2016 8:42 AM   Other:  02/27/2016 8:42 AM   Other:   02/27/2016 8:42 AM    Scribe for Treatment Team:   Trish Mage, 02/27/2016 8:42 AM

## 2016-02-27 NOTE — [facility] Group Notes (Signed)
[facility]  02/27/2016 , 2:02 PM    Type of Therapy:  Mental [employer] Association Presentation  Participation Level:  Active  Participation Quality:  Attentive  Affect:  Blunted  Cognitive:  Oriented  Insight:  Limited  Engagement in Therapy:  Engaged  Modes of Intervention:  Discussion, Education and Socialization  Summary of Progress/Problems:  Onalee HuaDavid from [employer] came to present his recovery story and play the guitar.  Stayed the entire time.  Nodded off for awhile, but was engaged when awake.  [name], Samuel Martin 02/27/2016 , 2:02 PM

## 2016-02-27 NOTE — [facility] Group Notes (Signed)
Adult Psychoeducational Group Note  Date:  02/27/2016 Time:  8:52 PM  Group Topic/Focus:  Wrap-Up Group:   The focus of this group is to help patients review their daily goal of treatment and discuss progress on daily workbooks.  Participation Level:  Minimal  Participation Quality:  Appropriate  Affect:  Appropriate  Cognitive:  Appropriate  Insight: Good  Engagement in Group:  Limited  Modes of Intervention:  Discussion  Additional Comments:  Pt rated his day an 8.  He stated his day was good because he went outside.  He also stated "there are good nurses here".  His goal was to find out when he is leaving.  Pt stated he is discharging tomorrow.  Caroll RancherLindsay, Aileana Hodder A 02/27/2016, 8:52 PM

## 2016-02-27 NOTE — Plan of Care (Signed)
Problem: Safety: Goal: Ability to remain free from injury will improve Outcome: Completed/Met Date Met:  02/27/16 Patient is not having any withdrawal symptoms.  No withdrawal symptoms noted.

## 2016-02-27 NOTE — Plan of Care (Signed)
Problem: Safety: Goal: Periods of time without injury will increase Outcome: Progressing No injury at this time. Samuel Martin denies SI/HI

## 2016-02-27 NOTE — Progress Notes (Signed)
[facility] MD Progress Note  02/27/2016 5:21 PM Samuel Martin  MRN:  [id number] Subjective:   Patient reports " I think I am doing OK". At this time denies any hallucinations, denies any depression, and describes " feeling normal"  Denies medication side effects. Objective: I have discussed case with treatment team and have met with patient . Patient reports , as above, feeling "OK".  Denies any psychotic symptoms and presents calm, pleasant, not guarded or internally preoccupied. No agitated or disruptive behaviors on unit . At this time tolerating medications well - no akathisia, no abnormal involuntary movements noted. He is visible in milieu, going to groups, and states he enjoyed playing basketball with peers earlier today. We discussed potential role that drug use may have had on psychotic episode, presentation prior to admission- patient acknowledges, and states that maybe cannabis was " laced" with some other drug. I have encouraged him to maintain full abstinence from cannabis and other substances as part of treatment goals .  Labs - TSH WNL. Lipid panel unremarkable- HDL 39.   Principal Problem: Psychosis Diagnosis:   Patient Active Problem List   Diagnosis Date Noted  . Psychosis [F29] 02/25/2016  . Drug reaction resulting in brief psychotic states [facility]) [F19.159] 05/01/2015  . 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 Acute psychosis [F29] 04/03/2015  . Marijuana abuse [F12.10] 04/03/2015   Total Time spent with patient: 20 minutes    Past Medical History:  Past Medical History  Diagnosis Date  . Thrombocytopenia ([place])    History reviewed. No pertinent past surgical history. Family History: History reviewed. No pertinent family history.  Social History:  History  Alcohol Use No     History  Drug Use  . Yes  . Special: Marijuana    Social History   Social History  . Marital Status: Single    Spouse Name: N/A  . Number of Children: N/A  . Years of Education: N/A   Social History Main  Topics  . Smoking status: Never Smoker   . Smokeless tobacco: None  . Alcohol Use: No  . Drug Use: Yes    Special: Marijuana  . Sexual Activity: Not Asked   Other Topics Concern  . None   Social History Narrative   Additional Social History:   Sleep: Fair  Appetite:  Good  Current Medications: Current Facility-Administered Medications  Medication Dose Route Frequency Provider Last Rate Last Dose  . acetaminophen (TYLENOL) tablet 650 mg  650 mg Oral Q6H PRN Patrecia Pour, NP      . alum & mag hydroxide-simeth (MAALOX/MYLANTA) 200-200-20 MG/5ML suspension 30 mL  30 mL Oral Q4H PRN Patrecia Pour, NP      . LORazepam (ATIVAN) tablet 0.5 mg  0.5 mg Oral Q6H PRN Jenne Campus, MD   0.5 mg at 02/27/16 0122  . magnesium hydroxide (MILK OF MAGNESIA) suspension 30 mL  30 mL Oral Daily PRN Patrecia Pour, NP      . OLANZapine zydis (ZYPREXA) disintegrating tablet 5 mg  5 mg Oral BID Jenne Campus, MD   5 mg at 02/27/16 0818    Lab Results:  Results for orders placed or performed during the hospital encounter of 02/25/16 (from the past 48 hour(s))  Lipid panel     Status: Abnormal   Collection Time: 02/27/16  6:14 AM  Result Value Ref Range   Cholesterol 113 0 - 200 mg/dL   Triglycerides 117 <150 mg/dL   HDL 39 (L) >40 mg/dL  Total CHOL/HDL Ratio 2.9 RATIO   VLDL 23 0 - 40 mg/dL   LDL Cholesterol 51 0 - 99 mg/dL    Comment:        Total Cholesterol/HDL:CHD Risk Coronary Heart Disease Risk Table                     Men   Women  1/2 Average Risk   3.4   3.3  Average Risk       5.0   4.4  2 X Average Risk   9.6   7.1  3 X Average Risk  23.4   11.0        Use the calculated Patient Ratio above and the CHD Risk Table to determine the patient's CHD Risk.        ATP III CLASSIFICATION (LDL):  <100     mg/dL   Optimal  100-129  mg/dL   Near or Above                    Optimal  130-159  mg/dL   Borderline  160-189  mg/dL   High  >190     mg/dL   Very High Performed at  [facility]   TSH     Status: None   Collection Time: 02/27/16  6:14 AM  Result Value Ref Range   TSH 1.451 0.350 - 4.500 uIU/mL    Comment: Performed at [facility]    Blood Alcohol level:  Lab Results  Component Value Date   [facility] <5 02/23/2016   ETH <5 04/28/2015    Physical Findings: AIMS: Facial and Oral Movements Muscles of Facial Expression: None, normal Lips and Perioral Area: None, normal Jaw: None, normal Tongue: None, normal,Extremity Movements Upper (arms, wrists, hands, fingers): None, normal Lower (legs, knees, ankles, toes): None, normal, Trunk Movements Neck, shoulders, hips: None, normal, Overall Severity Severity of abnormal movements (highest score from questions above): None, normal Incapacitation due to abnormal movements: None, normal Patient's awareness of abnormal movements (rate only patient's report): No Awareness, Dental Status Current problems with teeth and/or dentures?: No Does patient usually wear dentures?: No  CIWA:  CIWA-Ar Total: 4 COWS:     Musculoskeletal: Strength & Muscle Tone: within normal limits Gait & Station: normal Patient leans: N/A  Psychiatric Specialty Exam: ROS denies headache, denies chest pain, denies shortness of breath, denies nausea or vomiting , no rash   Blood pressure 137/80, pulse 67, temperature 98.4 F (36.9 C), temperature source Oral, resp. rate 18, height _0  (1.778 m), weight 176 lb (79.833 kg), SpO2 100 %.Body mass index is 25.25 kg/(m^2).  General Appearance: Fairly Groomed  Engineer, water::  Good  Speech:  Normal Rate  Volume:  Normal  Mood:  denies depression,reports mood as "OK"  Affect:  appropriate,reactive   Thought Process:  Linear  Orientation:  Other:  fully alert and attentive   Thought Content:  denies hallucinations, no delusions   Suicidal Thoughts:  No denies any suicidal or self injurious ideations at this time , denies any violent or homicidal ideations    Homicidal Thoughts:  No  Memory:  recent and remote grossly intact   Judgement:  Improved   Insight:  Fair  Psychomotor Activity:  Normal  Concentration:  Good  Recall:  Good  Fund of Knowledge:Good  Language: Good  Akathisia:  Negative  Handed:  Right  AIMS (if indicated):     Assets:  Desire for Improvement  Physical Health Resilience  ADL's:  Intact  Cognition: WNL  Sleep:  Number of Hours: 4.5  Assessment - patient reports improvement and at this time denies any psychotic symptoms, and does not appear internally preoccupied, paranoid or bizarre. Behavior in good control, not disruptive or agitated . Tends to minimize recent events, but does acknowledge recent decompensation may have been drug induced, and rapid improvement is suggestive of possible drug related psychosis.  Tolerating medications well at this time  Treatment Plan Summary: Daily contact with patient to assess and evaluate symptoms and progress in treatment, Medication management, Plan inpatient treatment  and medication management as below  Encourage ongoing group and milieu participation  Encourage ongoing focus and efforts regarding abstinence from illicit drugs  Continue Ativan 0.5 mgrs Q 6 hours PRN for anxiety as needed  Continue Zyprexa Zydis 5 mgrs  BID for psychosis- consider tapering down gradually as he continues to stabilize . Treatment team working on disposition planning options  Neita Garnet, MD 02/27/2016, 5:21 PM

## 2016-02-28 DIAGNOSIS — F12951 Cannabis use, unspecified with psychotic disorder with hallucinations: Secondary | ICD-10-CM | POA: Diagnosis present

## 2016-02-28 DIAGNOSIS — F1220 Cannabis dependence, uncomplicated: Secondary | ICD-10-CM | POA: Diagnosis present

## 2016-02-28 LAB — HEMOGLOBIN A1C
Hgb A1c MFr Bld: 5.5 % (ref 4.8–5.6)
MEAN PLASMA GLUCOSE: 111 mg/dL

## 2016-02-28 LAB — PROLACTIN: PROLACTIN: 36.9 ng/mL — AB (ref 4.0–15.2)

## 2016-02-28 MED ORDER — OLANZAPINE 5 MG PO TBDP: 5.0000 mg | ORAL_TABLET | Freq: Two times a day (BID) | ORAL | Status: DC

## 2016-02-28 NOTE — Progress Notes (Signed)
  [facility] :  Will you be returning to the same living situation after discharge:  Yes,  home At discharge, do you have transportation home?: Yes,  family Do you have the ability to pay for your medications: Yes,  mental health  Release of information consent forms completed and in the chart;  Patient's signature needed at discharge.  Patient to Follow up at: Follow-up Information    [facility]   Specialty:  Behavioral Health   Why:  Go to the walk-in clinc M-F between 8 and 10AM for your hospital follow up appointment   Contact information:   [address] [place] [address] [phone]       Next level of care provider [facility] Link:no  Safety Planning and Suicide Prevention discussed: Yes,  yes  Have you used any form of tobacco in the last 30 days? (Cigarettes, Smokeless Tobacco, Cigars, and/or Pipes): Yes  Has patient been referred to the Quitline?: N/A patient is not a smoker  Patient has been referred for addiction treatment: Yes  [name], Quandarius Nill B 02/28/2016, 3:26 PM

## 2016-02-28 NOTE — Progress Notes (Signed)
D). Patient has been calm and cooperative on the unit. He denies SI/HI/AVH/pain. He reports 0/10 for depression, anxiety and hopelessness. His goal was: "To leave and start work". He has some paranoid behavior IE: He has some darting eyes and some hesitation in answering questions and laughing inappropriately when asked questions. A). Emotional support and encouragement offered. Education provided on medication, indications and side effect. R). Continue to maintain safety on the unit. Continue to take medications.s as prescribed. Pt. discharged to lobby.  Belongings sheet reviewed and signed by pt. and all belongings sent home. Paperwork reviewed and pt. able to verbalize understanding of education. Pt. in no current distress and ambulatory. Denies SI/HI/AVH/Pain.

## 2016-02-28 NOTE — [facility] Suicide Risk Assessment (Signed)
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 Cannabis use disorder, severe, dependence (HCC) [F12.20] 02/28/2016    Total Time spent with patient: 30 minutes  Musculoskeletal: Strength & Muscle Tone: within normal limits Gait & Station: normal Patient leans: N/A  Psychiatric Specialty Exam: Review of Systems  Psychiatric/Behavioral: Positive for substance abuse. Negative for depression.  All other systems reviewed and are negative.   Blood pressure 136/95, pulse 71, temperature 97.8 F (36.6 C), temperature source Oral, resp. rate 20, height 5\' 10"  (1.778 m), weight 79.833 kg (176 lb), SpO2 100 %.Body mass index is 25.25 kg/(m^2).  General Appearance: Casual  Eye Contact::  Fair  Speech:  Normal Rate409  Volume:  Normal  Mood:  Euthymic  Affect:  Congruent  Thought Process:  Goal Directed  Orientation:  Full (Time, Place, and Person)  Thought Content:  WDL  Suicidal Thoughts:  No  Homicidal Thoughts:  No  Memory:  Immediate;   Fair Recent;   Fair Remote;   Fair  Judgement:  Fair  Insight:  Fair  Psychomotor Activity:  Normal  Concentration:  Fair  Recall:  [employer]Fair  Fund of Knowledge:Fair  Language: Fair  Akathisia:  No  Handed:  Right  AIMS (if indicated):     Assets:  Desire for Improvement  Sleep:  Number of Hours: 3.75  Cognition: WNL  ADL's:  Intact   Mental Status Per Nursing Assessment::   On Admission:  NA  Demographic Factors:  Male  Loss Factors: NA  Historical Factors: Impulsivity  Risk Reduction Factors:   Positive social support  Continued Clinical Symptoms:  Alcohol/Substance Abuse/Dependencies  Cognitive Features That Contribute To Risk:  None    Suicide Risk:  Minimal: No identifiable suicidal  ideation.  Patients presenting with no risk factors but with morbid ruminations; may be classified as minimal risk based on the severity of the depressive symptoms    Plan Of Care/Follow-up recommendations:  Activity:  no restrictions Diet:  regular Tests:  as needed Other:  follow up with aftercare  Charli Liberatore, MD 02/28/2016, 9:52 AM

## 2016-02-28 NOTE — BHH Group Notes (Signed)
[facility] Group Notes:  (Nursing/MHT/Case Management/Adjunct)  Date:  02/28/2016  Time:  10:28 AM  Type of Therapy:  Nurse Education  Participation Level:  Active  Participation Quality:  Appropriate and Attentive  Affect:  Appropriate  Cognitive:  Alert and Appropriate  Insight:  Appropriate, Good and Improving  Engagement in Group:  Engaged and Improving  Modes of Intervention:  Activity, Discussion and Education  Summary of Progress/Problems: Topic was on leisure and lifestyle changes. Discussed the importance of choosing a healthy leisure activities. Group encouraged to surround themselves with positive and healthy group/support system when changing to a healthy lifestyle. Patient was receptive and contributed.    Mickie Baillizabeth O Iwenekha 02/28/2016, 10:28 AM
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
## 2016-02-28 NOTE — Discharge Summary (Signed)
Physician Discharge Summary Note  Patient:  Samuel Martin is an 20 y.o., male MRN:  [id number] DOB:  04/06/1996 Patient phone:  [phone] (home)  Patient address:   [address] [place] [id number],  Total Time spent with patient: 30 minutes  Date of Admission:  02/25/2016 Date of Discharge: 02/28/2016  Reason for Admission:  Bizarre behavior  Principal Problem: Cannabis-induced psychotic disorder with hallucinations [facility](HCC) Discharge Diagnoses: Patient Active Problem List   Diagnosis Date Noted  . 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 Cannabis use disorder, severe, dependence (HCC) [F12.20] 02/28/2016    Past Psychiatric History:  See above noted  Past Medical History:  Past Medical History  Diagnosis Date  . Thrombocytopenia (HCC)    History reviewed. No pertinent past surgical history. Family History: History reviewed. No pertinent family history. Family Psychiatric  History:  Denied Social History:  History  Alcohol Use No     History  Drug Use  . Yes  . Special: Marijuana    Social History   Social History  . Marital Status: Single    Spouse Name: N/A  . Number of Children: N/A  . Years of Education: N/A   Social History Main Topics  . Smoking status: Never Smoker   . Smokeless tobacco: None  . Alcohol Use: No  . Drug Use: Yes    Special: Marijuana  . Sexual Activity: Not Asked   Other Topics Concern  . None   Social History Narrative    Hospital Course:  Samuel Martin is an 20 y.o. male presenting to [facility] under IVC as he presents to ED with psychosic behavior. He was brought to hospital by his mother due to patient exhibiting concern regarding bizarre behaviors, such as dancing alone , talking to self, staring into space.    Samuel Martin was admitted for Cannabis-induced psychotic disorder with hallucinations (HCC) and crisis management.  He was treated with Ativan 0.5 mg for anxiety as  needed, Zyprexa Zydis 5 mg BID for psychosis.  Discussed with patient to discuss with their outpatient prvder to adjust dose accordingly as his mood stabilizes.  Medical problems were identified and treated as needed.  Home medications were restarted as appropriate.  Improvement was monitored by observation and Zavian Jeanpaul daily report of symptom reduction.  Emotional and mental status was monitored by daily self inventory reports completed by Samuel Martin and clinical staff.  Patient reported continued improvement, denied any new concerns.  Patient had been compliant on medications and denied side effects.  Support and encouragement was provided.    Patient did well during inpatient stay.  At time of discharge, patient rated both depression and anxiety levels to be manageable and minimal.  Patient was encouraged to attend classes to able to help maintain mood stabilization and to identify positive things in life and recognize unhealthy or abusive tendencies.         Samuel Martin was evaluated by the treatment team for stability and plans for continued recovery upon discharge.  He was offered further treatment options upon discharge including Residential, Intensive Outpatient and Outpatient treatment.  He will follow up with agencies listed below for medication management and counseling.  Encouraged patient to maintain satisfactory support network and home environment.  Advised to adhere to medication compliance and outpatient treatment follow up.      Samuel Martin motivation was an integral factor for scheduling further treatment.  Employment, transportation, bed availability, health status, family support, and any pending legal  issues were also considered during his hospital stay.  Upon completion of this admission the patient was both mentally and medically stable for discharge denying suicidal/homicidal ideation, auditory/visual/tactile hallucinations, delusional thoughts and paranoia.       Physical Findings: AIMS: Facial and Oral Movements Muscles of Facial Expression: None, normal Lips and Perioral Area: None, normal Jaw: None, normal Tongue: None, normal,Extremity Movements Upper (arms, wrists, hands, fingers): None, normal Lower (legs, knees, ankles, toes): None, normal, Trunk Movements Neck, shoulders, hips: None, normal, Overall Severity Severity of abnormal movements (highest score from questions above): None, normal Incapacitation due to abnormal movements: None, normal Patient's awareness of abnormal movements (rate only patient's report): No Awareness, Dental Status Current problems with teeth and/or dentures?: No Does patient usually wear dentures?: No  CIWA:  CIWA-Ar Total: 4 COWS:     Musculoskeletal: Strength & Muscle Tone: within normal limits Gait & Station: normal Patient leans: N/A  Psychiatric Specialty Exam: Review of Systems  Psychiatric/Behavioral: Negative for suicidal ideas, hallucinations and substance abuse. The patient is not nervous/anxious.     Blood pressure 136/95, pulse 71, temperature 97.8 F (36.6 C), temperature source Oral, resp. rate 20, height  (1.778 m), weight 79.833 kg (176 lb), SpO2 100 %.Body mass index is 25.25 kg/(m^2).  Have you used any form of tobacco in the last 30 days? (Cigarettes, Smokeless Tobacco, Cigars, and/or Pipes): Yes  Has this patient used any form of tobacco in the last 30 days? (Cigarettes, Smokeless Tobacco, Cigars, and/or Pipes) Yes, N/A  Blood Alcohol level:  Lab Results  Component Value Date   [facility] <5 02/23/2016   ETH <5 04/28/2015    Metabolic Disorder Labs:  Lab Results  Component Value Date   HGBA1C 5.5 02/27/2016   MPG 111 02/27/2016   Lab Results  Component Value Date   PROLACTIN 36.9* 02/27/2016   Lab Results  Component Value Date   CHOL 113 02/27/2016   TRIG 117 02/27/2016   HDL 39* 02/27/2016   CHOLHDL 2.9 02/27/2016   VLDL 23 02/27/2016   LDLCALC 51 02/27/2016     See Psychiatric Specialty Exam and Suicide Risk Assessment completed by Attending Physician prior to discharge.  Discharge destination:  Home  Is patient on multiple antipsychotic therapies at discharge:  No   Has Patient had three or more failed trials of antipsychotic monotherapy by history:  No  Recommended Plan for Multiple Antipsychotic Therapies: NA    Medication List    STOP taking these medications        benztropine 0.5 MG tablet  Commonly known as:  COGENTIN     traZODone 50 MG tablet  Commonly known as:  DESYREL      TAKE these medications      Indication   OLANZapine zydis 5 MG disintegrating tablet  Commonly known as:  ZYPREXA  Take 1 tablet (5 mg total) by mouth 2 (two) times daily.   Indication:  psychosis       Follow-up recommendations:  Activity:  as tol Diet:  as tol  Comments:  1.  Take all your medications as prescribed.   2.  Report any adverse side effects to outpatient provider. 3.  Patient instructed to not use alcohol or illegal drugs while on prescription medicines. 4.  In the event of worsening symptoms, instructed patient to call 911, the crisis hotline or go to nearest emergency room for evaluation of symptoms.  Signed: Lindwood Qua, NP [facility] 02/28/2016, 11:23 AM

## 2016-02-28 NOTE — Tx Team (Signed)
Interdisciplinary Treatment Plan Update (Adult)  Date:  02/28/2016   Time Reviewed:  3:20 PM   Progress in Treatment: Attending groups: Yes. Participating in groups:  Minimally Taking medication as prescribed:  Yes. Tolerating medication:  Yes. Family/Significant other contact made:  Yes Patient understands diagnosis: No  Limited insight Discussing patient identified problems/goals with staff:  Yes, see initial care plan. Medical problems stabilized or resolved:  Yes. Denies suicidal/homicidal ideation: Yes. Issues/concerns per patient self-inventory:  No. Other:  New problem(s) identified:  Discharge Plan or Barriers: see below  Reason for Continuation of Hospitalization: Other; describe Paranoia, Disorganization  Medication management  Comments:  Patient with history psychosis presents to ED with psychosic behavior. He was naked outside today in front of children. Actively hallucinating and responding to internal stimuli.   Patient reports that he came in to the ED "because my head wasn't right." When asked to elaborate, patient states "I just wasn't feeling right." Patient denies SI and history of attempts. Patient denies HI and history of aggression. Patient minimizes symptoms but does state " I know I have been acting kind of weird, but most of it is exaggeration". Denies depression, denies any suicidal ideations. He states he has been smoking cannabis almost daily. He has one prior psychiatric admission in July of 2016 for similar presentation ( psychosis) , which at the time was felt to be drug related . At the time he was stabilized on Zyprexa. Dx- Psychosis, consider substance induced  Plan- inpatient psychiatric admission , start Zyprexa Zydis 5 mgrs BID   Estimated length of stay: D/C today  New goal(s):  Review of initial/current patient goals per problem list:   Review of initial/current patient goals per problem list:  1. Goal(s): Patient will participate in  aftercare plan   Met: Yes   Target date: 3-5 days post admission date   As evidenced by: Patient will participate within aftercare plan AEB aftercare provider and housing plan at discharge being identified. 02/27/16:  Return home with mother, follow up outpt        4. Goal(s): Patient will demonstrate decreased signs of withdrawal due to substance abuse   Met: Yes   Target date: 3-5 days post admission date   As evidenced by: Patient will produce a CIWA/COWS score of 0, have stable vitals signs, and no symptoms of withdrawal 02/27/16:  No longer exhibiting psychosis.  Admits to smoking cannabis, but minimizes-"once in a blue moon."    5. Goal(s): Patient will demonstrate decreased signs of psychosis  * Met: Yes  * Target date: 3-5 days post admission date  * As evidenced by: Patient will demonstrate decreased frequency of AVH or return to baseline function 02/27/16:  Had not been attending oupt appointments nor taking meds since last admission.  Exhibiting confusion, disorganization with bizarre thoughts and behavior prior to admission. 02/28/16:  [place] to take meds.  No signs nor symptoms of psychosis today          Attendees: Patient:  02/28/2016 3:20 PM   Family:   02/28/2016 3:20 PM   Physician:  Ursula Alert, MD 02/28/2016 3:20 PM   Nursing:   Gaylan Gerold, RN 02/28/2016 3:20 PM   CSW:    Roque Lias, LCSW   02/28/2016 3:20 PM   Other:  02/28/2016 3:20 PM   Other:   02/28/2016 3:20 PM   Other:  Lars Pinks, Nurse CM 02/28/2016 3:20 PM   Other:   02/28/2016 3:20 PM   Other:  Norberto Sorenson,  P4CC  02/28/2016 3:20 PM   Other:  02/28/2016 3:20 PM   Other:  02/28/2016 3:20 PM   Other:  02/28/2016 3:20 PM   Other:  02/28/2016 3:20 PM   Other:  02/28/2016 3:20 PM   Other:   02/28/2016 3:20 PM    Scribe for Treatment Team:   Trish Mage, 02/28/2016 3:20 PM

## 2016-02-29 NOTE — Progress Notes (Signed)
Did not attend group 

## 2016-07-13 ENCOUNTER — Emergency Department (HOSPITAL_COMMUNITY): Payer: Medicaid Other

## 2016-07-13 ENCOUNTER — Encounter (HOSPITAL_COMMUNITY): Payer: Self-pay | Admitting: *Deleted

## 2016-07-13 ENCOUNTER — Emergency Department (HOSPITAL_COMMUNITY): Payer: Medicaid Other | Admitting: Certified Registered"

## 2016-07-13 ENCOUNTER — Inpatient Hospital Stay (HOSPITAL_COMMUNITY)
Admission: EM | Admit: 2016-07-13 | Discharge: 2016-07-23 | DRG: 011 | Disposition: A | Discharge status: Home Health | Payer: Medicaid Other | Attending: Surgery | Admitting: Surgery

## 2016-07-13 ENCOUNTER — Encounter (HOSPITAL_COMMUNITY): Admission: EM | Disposition: A | Discharge status: Home Health | Payer: Self-pay | Source: Home / Self Care

## 2016-07-13 ENCOUNTER — Ambulatory Visit: Payer: Self-pay | Admitting: Otolaryngology

## 2016-07-13 DIAGNOSIS — J9691 Respiratory failure, unspecified with hypoxia: Secondary | ICD-10-CM | POA: Diagnosis not present

## 2016-07-13 DIAGNOSIS — W3400XA Accidental discharge from unspecified firearms or gun, initial encounter: Secondary | ICD-10-CM

## 2016-07-13 DIAGNOSIS — R338 Other retention of urine: Secondary | ICD-10-CM | POA: Diagnosis not present

## 2016-07-13 DIAGNOSIS — J9600 Acute respiratory failure, unspecified whether with hypoxia or hypercapnia: Secondary | ICD-10-CM | POA: Diagnosis present

## 2016-07-13 DIAGNOSIS — D62 Acute posthemorrhagic anemia: Secondary | ICD-10-CM | POA: Diagnosis not present

## 2016-07-13 DIAGNOSIS — S1193XA Puncture wound without foreign body of unspecified part of neck, initial encounter: Secondary | ICD-10-CM

## 2016-07-13 DIAGNOSIS — R339 Retention of urine, unspecified: Secondary | ICD-10-CM | POA: Diagnosis not present

## 2016-07-13 DIAGNOSIS — S1183XA Puncture wound without foreign body of other specified part of neck, initial encounter: Secondary | ICD-10-CM | POA: Diagnosis present

## 2016-07-13 DIAGNOSIS — Z91013 Allergy to seafood: Secondary | ICD-10-CM

## 2016-07-13 DIAGNOSIS — D696 Thrombocytopenia, unspecified: Secondary | ICD-10-CM | POA: Diagnosis present

## 2016-07-13 DIAGNOSIS — R609 Edema, unspecified: Secondary | ICD-10-CM | POA: Diagnosis present

## 2016-07-13 DIAGNOSIS — S7292XA Unspecified fracture of left femur, initial encounter for closed fracture: Secondary | ICD-10-CM | POA: Diagnosis present

## 2016-07-13 DIAGNOSIS — R262 Difficulty in walking, not elsewhere classified: Secondary | ICD-10-CM

## 2016-07-13 DIAGNOSIS — S72402A Unspecified fracture of lower end of left femur, initial encounter for closed fracture: Secondary | ICD-10-CM | POA: Diagnosis present

## 2016-07-13 DIAGNOSIS — S02601A Fracture of unspecified part of body of right mandible, initial encounter for closed fracture: Principal | ICD-10-CM | POA: Diagnosis present

## 2016-07-13 DIAGNOSIS — Z930 Tracheostomy status: Secondary | ICD-10-CM

## 2016-07-13 DIAGNOSIS — F17200 Nicotine dependence, unspecified, uncomplicated: Secondary | ICD-10-CM | POA: Diagnosis present

## 2016-07-13 DIAGNOSIS — S71132A Puncture wound without foreign body, left thigh, initial encounter: Secondary | ICD-10-CM | POA: Diagnosis present

## 2016-07-13 DIAGNOSIS — Z23 Encounter for immunization: Secondary | ICD-10-CM

## 2016-07-13 DIAGNOSIS — S72409A Unspecified fracture of lower end of unspecified femur, initial encounter for closed fracture: Secondary | ICD-10-CM

## 2016-07-13 DIAGNOSIS — S71139A Puncture wound without foreign body, unspecified thigh, initial encounter: Secondary | ICD-10-CM

## 2016-07-13 HISTORY — PX: TRACHEOSTOMY TUBE PLACEMENT: SHX814

## 2016-07-13 HISTORY — PX: RADICAL NECK DISSECTION: SHX2284

## 2016-07-13 HISTORY — PX: DIRECT LARYNGOSCOPY: SHX5326

## 2016-07-13 LAB — PREPARE FRESH FROZEN PLASMA
UNIT DIVISION: 0
UNIT DIVISION: 0

## 2016-07-13 LAB — COMPREHENSIVE METABOLIC PANEL
ALBUMIN: 4.2 g/dL (ref 3.5–5.0)
ALK PHOS: 73 U/L (ref 38–126)
ALT: 39 U/L (ref 17–63)
ALT: 37 U/L (ref 17–63)
ANION GAP: 12 (ref 5–15)
ANION GAP: 11 (ref 5–15)
AST: 45 U/L — ABNORMAL HIGH (ref 15–41)
AST: 47 U/L — AB (ref 15–41)
Albumin: 4 g/dL (ref 3.5–5.0)
Alkaline Phosphatase: 68 U/L (ref 38–126)
BILIRUBIN TOTAL: 0.9 mg/dL (ref 0.3–1.2)
BILIRUBIN TOTAL: 0.7 mg/dL (ref 0.3–1.2)
BUN: 6 mg/dL (ref 6–20)
BUN: 6 mg/dL (ref 6–20)
CALCIUM: 8.9 mg/dL (ref 8.9–10.3)
CHLORIDE: 107 mmol/L (ref 101–111)
CO2: 23 mmol/L (ref 22–32)
CO2: 23 mmol/L (ref 22–32)
Calcium: 8.5 mg/dL — ABNORMAL LOW (ref 8.9–10.3)
Chloride: 100 mmol/L — ABNORMAL LOW (ref 101–111)
Creatinine, Ser: 1.3 mg/dL — ABNORMAL HIGH (ref 0.61–1.24)
Creatinine, Ser: 1.09 mg/dL (ref 0.61–1.24)
GFR calc non Af Amer: >60 mL/min (ref >60)
GFR calc non Af Amer: >60 mL/min (ref >60)
GLUCOSE: 160 mg/dL — AB (ref 65–99)
Glucose, Bld: 99 mg/dL (ref 65–99)
POTASSIUM: 3.2 mmol/L — AB (ref 3.5–5.1)
Potassium: 4.3 mmol/L (ref 3.5–5.1)
Sodium: 135 mmol/L (ref 135–145)
Sodium: 141 mmol/L (ref 135–145)
TOTAL PROTEIN: 6.6 g/dL (ref 6.5–8.1)
Total Protein: 6 g/dL — ABNORMAL LOW (ref 6.5–8.1)

## 2016-07-13 LAB — CBC
HEMATOCRIT: 45.5 % (ref 39.0–52.0)
HEMATOCRIT: 43.6 % (ref 39.0–52.0)
HEMOGLOBIN: 14.6 g/dL (ref 13.0–17.0)
Hemoglobin: 14.3 g/dL (ref 13.0–17.0)
MCH: 29.5 pg (ref 26.0–34.0)
MCH: 29.7 pg (ref 26.0–34.0)
MCHC: 32.1 g/dL (ref 30.0–36.0)
MCHC: 32.8 g/dL (ref 30.0–36.0)
MCV: 91.9 fL (ref 78.0–100.0)
MCV: 90.5 fL (ref 78.0–100.0)
PLATELETS: 95 10*3/uL — AB (ref 150–400)
Platelets: 125 10*3/uL — ABNORMAL LOW (ref 150–400)
RBC: 4.95 MIL/uL (ref 4.22–5.81)
RBC: 4.82 MIL/uL (ref 4.22–5.81)
RDW: 13.5 % (ref 11.5–15.5)
RDW: 13.6 % (ref 11.5–15.5)
WBC: 5.7 10*3/uL (ref 4.0–10.5)
WBC: 13.9 10*3/uL — ABNORMAL HIGH (ref 4.0–10.5)

## 2016-07-13 LAB — I-STAT CHEM 8, ED
BUN: 6 mg/dL (ref 6–20)
CALCIUM ION: 1.11 mmol/L — AB (ref 1.15–1.40)
Chloride: 102 mmol/L (ref 101–111)
Creatinine, Ser: 1.2 mg/dL (ref 0.61–1.24)
GLUCOSE: 150 mg/dL — AB (ref 65–99)
HCT: 46 % (ref 39.0–52.0)
Hemoglobin: 15.6 g/dL (ref 13.0–17.0)
Potassium: 3.1 mmol/L — ABNORMAL LOW (ref 3.5–5.1)
SODIUM: 141 mmol/L (ref 135–145)
TCO2: 26 mmol/L (ref 0–100)

## 2016-07-13 LAB — TRIGLYCERIDES: TRIGLYCERIDES: 48 mg/dL (ref <150)

## 2016-07-13 LAB — ABO/RH: ABO/RH(D): A NEG

## 2016-07-13 LAB — ETHANOL

## 2016-07-13 LAB — PROTIME-INR
INR: 1
PROTHROMBIN TIME: 13.2 s (ref 11.4–15.2)

## 2016-07-13 LAB — I-STAT CG4 LACTIC ACID, ED: LACTIC ACID, VENOUS: 4.57 mmol/L — AB (ref 0.5–1.9)

## 2016-07-13 SURGERY — LARYNGOSCOPY, DIRECT
Anesthesia: General | Site: Neck

## 2016-07-13 MED ORDER — SODIUM CHLORIDE 0.9 % IV SOLN
10.0000 ug/h | Freq: Once | INTRAVENOUS | Status: AC
  Administered 2016-07-13: 50 ug/h via INTRAVENOUS
  Filled 2016-07-13: qty 50

## 2016-07-13 MED ORDER — PROPOFOL 1000 MG/100ML IV EMUL
0.0000 ug/kg/min | INTRAVENOUS | Status: DC
  Administered 2016-07-13: 40 ug/kg/min via INTRAVENOUS
  Administered 2016-07-14: 15 ug/kg/min via INTRAVENOUS
  Administered 2016-07-14: 45 ug/kg/min via INTRAVENOUS
  Filled 2016-07-13 (×2): qty 100

## 2016-07-13 MED ORDER — FENTANYL CITRATE (PF) 100 MCG/2ML IJ SOLN
INTRAMUSCULAR | Status: AC
  Administered 2016-07-13: 100 ug
  Filled 2016-07-13: qty 2

## 2016-07-13 MED ORDER — FENTANYL BOLUS VIA INFUSION
50.0000 ug | INTRAVENOUS | Status: DC | PRN
  Filled 2016-07-13: qty 50

## 2016-07-13 MED ORDER — LIDOCAINE-EPINEPHRINE 1 %-1:100000 IJ SOLN
INTRAMUSCULAR | Status: AC
  Filled 2016-07-13: qty 1

## 2016-07-13 MED ORDER — SODIUM CHLORIDE 0.9 % IV SOLN
INTRAVENOUS | Status: DC | PRN
  Administered 2016-07-13: 21:00:00 via INTRAVENOUS

## 2016-07-13 MED ORDER — LACTATED RINGERS IV SOLN
INTRAVENOUS | Status: DC | PRN
  Administered 2016-07-13: 22:00:00 via INTRAVENOUS

## 2016-07-13 MED ORDER — FAMOTIDINE IN NACL 20-0.9 MG/50ML-% IV SOLN
20.0000 mg | Freq: Two times a day (BID) | INTRAVENOUS | Status: DC
  Administered 2016-07-14 – 2016-07-17 (×7): 20 mg via INTRAVENOUS
  Filled 2016-07-13 (×8): qty 50

## 2016-07-13 MED ORDER — SODIUM CHLORIDE 0.9 % IV BOLUS (SEPSIS)
1000.0000 mL | Freq: Once | INTRAVENOUS | Status: AC
  Administered 2016-07-13: 1000 mL via INTRAVENOUS

## 2016-07-13 MED ORDER — MIDAZOLAM HCL 2 MG/2ML IJ SOLN
INTRAMUSCULAR | Status: DC | PRN
  Administered 2016-07-13: 2 mg via INTRAVENOUS

## 2016-07-13 MED ORDER — PROPOFOL 500 MG/50ML IV EMUL
INTRAVENOUS | Status: DC | PRN
  Administered 2016-07-13: 25 ug/kg/min via INTRAVENOUS

## 2016-07-13 MED ORDER — SODIUM CHLORIDE 0.9 % IV SOLN
25.0000 ug/h | INTRAVENOUS | Status: DC
  Administered 2016-07-13: 25 ug/h via INTRAVENOUS
  Filled 2016-07-13: qty 50

## 2016-07-13 MED ORDER — FENTANYL CITRATE (PF) 100 MCG/2ML IJ SOLN
INTRAMUSCULAR | Status: AC
  Administered 2016-07-13: 100 ug
  Filled 2016-07-13: qty 4

## 2016-07-13 MED ORDER — ORAL CARE MOUTH RINSE
15.0000 mL | Freq: Four times a day (QID) | OROMUCOSAL | Status: DC
  Administered 2016-07-14 – 2016-07-23 (×33): 15 mL via OROMUCOSAL

## 2016-07-13 MED ORDER — ROCURONIUM BROMIDE 100 MG/10ML IV SOLN
INTRAVENOUS | Status: DC | PRN
  Administered 2016-07-13: 25 mg via INTRAVENOUS
  Administered 2016-07-13: 50 mg via INTRAVENOUS

## 2016-07-13 MED ORDER — PANTOPRAZOLE SODIUM 40 MG PO TBEC
40.0000 mg | DELAYED_RELEASE_TABLET | Freq: Two times a day (BID) | ORAL | Status: DC
  Filled 2016-07-13 (×3): qty 1

## 2016-07-13 MED ORDER — PROPOFOL 1000 MG/100ML IV EMUL
INTRAVENOUS | Status: AC
  Filled 2016-07-13: qty 100

## 2016-07-13 MED ORDER — CEFAZOLIN SODIUM 1 G IJ SOLR
INTRAMUSCULAR | Status: DC | PRN
  Administered 2016-07-13: 2 g via INTRAMUSCULAR

## 2016-07-13 MED ORDER — CHLORHEXIDINE GLUCONATE 0.12% ORAL RINSE (MEDLINE KIT)
15.0000 mL | Freq: Two times a day (BID) | OROMUCOSAL | Status: DC
  Administered 2016-07-13 – 2016-07-23 (×18): 15 mL via OROMUCOSAL

## 2016-07-13 MED ORDER — 0.9 % SODIUM CHLORIDE (POUR BTL) OPTIME
TOPICAL | Status: DC | PRN
  Administered 2016-07-13: 1000 mL

## 2016-07-13 MED ORDER — IOPAMIDOL (ISOVUE-370) INJECTION 76%
100.0000 mL | Freq: Once | INTRAVENOUS | Status: AC | PRN
  Administered 2016-07-13: 100 mL via INTRAVENOUS

## 2016-07-13 MED ORDER — SUFENTANIL CITRATE 50 MCG/ML IV SOLN
INTRAVENOUS | Status: AC
  Filled 2016-07-13: qty 1

## 2016-07-13 MED ORDER — SODIUM CHLORIDE 0.9 % IV SOLN
INTRAVENOUS | Status: DC
  Administered 2016-07-13 – 2016-07-14 (×3): via INTRAVENOUS
  Administered 2016-07-15 (×2): 1000 mL via INTRAVENOUS

## 2016-07-13 MED ORDER — PROPOFOL 1000 MG/100ML IV EMUL
INTRAVENOUS | Status: AC
  Administered 2016-07-13: 20:00:00
  Filled 2016-07-13: qty 100

## 2016-07-13 MED ORDER — PROPOFOL 10 MG/ML IV BOLUS
INTRAVENOUS | Status: AC
  Filled 2016-07-13: qty 20

## 2016-07-13 MED ORDER — DOCUSATE SODIUM 50 MG/5ML PO LIQD
100.0000 mg | Freq: Two times a day (BID) | ORAL | Status: DC | PRN
  Administered 2016-07-18: 100 mg
  Filled 2016-07-13 (×2): qty 10

## 2016-07-13 MED ORDER — FENTANYL CITRATE (PF) 100 MCG/2ML IJ SOLN: 50.0000 ug | Freq: Once | INTRAMUSCULAR | Status: DC

## 2016-07-13 MED ORDER — TETANUS-DIPHTH-ACELL PERTUSSIS 5-2.5-18.5 LF-MCG/0.5 IM SUSP
0.5000 mL | Freq: Once | INTRAMUSCULAR | Status: AC
  Administered 2016-07-13: 0.5 mL via INTRAMUSCULAR
  Filled 2016-07-13: qty 0.5

## 2016-07-13 MED ORDER — SUFENTANIL CITRATE 50 MCG/ML IV SOLN
INTRAVENOUS | Status: DC | PRN
  Administered 2016-07-13 (×2): 10 ug via INTRAVENOUS

## 2016-07-13 MED ORDER — MIDAZOLAM HCL 2 MG/2ML IJ SOLN
INTRAMUSCULAR | Status: AC
  Filled 2016-07-13: qty 2

## 2016-07-13 SURGICAL SUPPLY — 59 items
BENZOIN TINCTURE PRP APPL 2/3 (GAUZE/BANDAGES/DRESSINGS) ×4 IMPLANT
BLADE SURG 15 STRL LF DISP TIS (BLADE) ×4 IMPLANT
BLADE SURG 15 STRL SS (BLADE) ×4
BLADE SURG ROTATE 9660 (MISCELLANEOUS) IMPLANT
BNDG GAUZE ELAST 4 BULKY (GAUZE/BANDAGES/DRESSINGS) ×4 IMPLANT
CANISTER SUCTION 2500CC (MISCELLANEOUS) ×8 IMPLANT
CLEANER TIP ELECTROSURG 2X2 (MISCELLANEOUS) ×4 IMPLANT
CONT SPEC 4OZ CLIKSEAL STRL BL (MISCELLANEOUS) ×4 IMPLANT
COVER MAYO STAND STRL (DRAPES) IMPLANT
COVER SURGICAL LIGHT HANDLE (MISCELLANEOUS) ×4 IMPLANT
COVER TABLE BACK 60X90 (DRAPES) ×4 IMPLANT
DECANTER SPIKE VIAL GLASS SM (MISCELLANEOUS) ×4 IMPLANT
DRAIN PENROSE 1/4X12 LTX STRL (WOUND CARE) ×4 IMPLANT
DRAPE PROXIMA HALF (DRAPES) ×4 IMPLANT
ELECT COATED BLADE 2.86 ST (ELECTRODE) ×4 IMPLANT
ELECT REM PT RETURN 9FT ADLT (ELECTROSURGICAL) ×4
ELECTRODE REM PT RTRN 9FT ADLT (ELECTROSURGICAL) ×2 IMPLANT
GAUZE SPONGE 4X4 16PLY XRAY LF (GAUZE/BANDAGES/DRESSINGS) ×12 IMPLANT
GLOVE BIO SURGEON STRL SZ7 (GLOVE) ×4 IMPLANT
GLOVE BIOGEL PI IND STRL 7.0 (GLOVE) ×4 IMPLANT
GLOVE BIOGEL PI IND STRL 7.5 (GLOVE) ×2 IMPLANT
GLOVE BIOGEL PI INDICATOR 7.0 (GLOVE) ×4
GLOVE BIOGEL PI INDICATOR 7.5 (GLOVE) ×2
GLOVE ECLIPSE 7.5 STRL STRAW (GLOVE) ×20 IMPLANT
GLOVE SS BIOGEL STRL SZ 7.5 (GLOVE) ×2 IMPLANT
GLOVE SUPERSENSE BIOGEL SZ 7.5 (GLOVE) ×2
GOWN STRL REUS W/ TWL LRG LVL3 (GOWN DISPOSABLE) ×4 IMPLANT
GOWN STRL REUS W/TWL LRG LVL3 (GOWN DISPOSABLE) ×4
GUARD TEETH (MISCELLANEOUS) IMPLANT
IMMOBILIZER KNEE 22 (SOFTGOODS) ×4 IMPLANT
KIT BASIN OR (CUSTOM PROCEDURE TRAY) ×8 IMPLANT
KIT ROOM TURNOVER OR (KITS) ×8 IMPLANT
LIDOCAINE 1% WITH EPI 1:100,000 IMPLANT
NEEDLE 27GAX1X1/2 (NEEDLE) ×4 IMPLANT
NS IRRIG 1000ML POUR BTL (IV SOLUTION) ×8 IMPLANT
PACK EENT II TURBAN DRAPE (CUSTOM PROCEDURE TRAY) IMPLANT
PAD ARMBOARD 7.5X6 YLW CONV (MISCELLANEOUS) ×8 IMPLANT
PATTIES SURGICAL .5 X3 (DISPOSABLE) IMPLANT
PENCIL FOOT CONTROL (ELECTRODE) ×4 IMPLANT
SOAP 2 % CHG 4 OZ (WOUND CARE) ×4 IMPLANT
SOLUTION ANTI FOG 6CC (MISCELLANEOUS) ×4 IMPLANT
SPONGE GAUZE 4X4 12PLY STER LF (GAUZE/BANDAGES/DRESSINGS) ×8 IMPLANT
STAPLER SKIN 35 WIDE (STAPLE) ×4 IMPLANT
SUT CHROMIC 2 0 SH (SUTURE) ×4 IMPLANT
SUT CHROMIC 3 0 SH 27 (SUTURE) ×4 IMPLANT
SUT ETHILON 3 0 PS 1 (SUTURE) ×4 IMPLANT
SUT SILK 3 0 REEL (SUTURE) ×4 IMPLANT
SUT SILK 4 0 TIE 10X30 (SUTURE) ×4 IMPLANT
SUT SILK 4 0 TIES 17X18 (SUTURE) IMPLANT
SYR 20CC LL (SYRINGE) ×4 IMPLANT
SYR CONTROL 10ML LL (SYRINGE) IMPLANT
TOWEL OR 17X24 6PK STRL BLUE (TOWEL DISPOSABLE) ×4 IMPLANT
TOWEL OR 17X26 10 PK STRL BLUE (TOWEL DISPOSABLE) ×4 IMPLANT
TRAY ENT MC OR (CUSTOM PROCEDURE TRAY) ×4 IMPLANT
TUBE CONNECTING 12'X1/4 (SUCTIONS) ×1
TUBE CONNECTING 12X1/4 (SUCTIONS) ×3 IMPLANT
TUBE GAUZE SZ 6 (GAUZE/BANDAGES/DRESSINGS) ×4 IMPLANT
TUBE TRACH SHILEY 8 DIST CUF (TUBING) ×4 IMPLANT
WATER STERILE IRR 1000ML POUR (IV SOLUTION) ×4 IMPLANT

## 2016-07-13 NOTE — ED Triage Notes (Addendum)
Pt arrived by [facility] to his anterior neck rt jaw and lt knee and groin.  Bleeding freely from the mouth  Attempts to intubate   Anesthesia came intuibated the pt after several attempts.  Pt able to  Talk initially unable to understand the allergy due to his non-rebreather.  Family in the ed.

## 2016-07-13 NOTE — ED Notes (Signed)
Preparing to intubate.  

## 2016-07-13 NOTE — ED Notes (Signed)
Returned from c-t 5 minutes ago  He has been given  200 mg fentanyl  Given while in c-t  Propofol   At 40 mcgs    Fentanyl 
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
## 2016-07-13 NOTE — ED Notes (Signed)
Anesthesia here to intubate.
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
## 2016-07-13 NOTE — Anesthesia Preprocedure Evaluation (Signed)
Anesthesia Evaluation  Patient identified by MRN, date of birth, ID band Patient unresponsive    Reviewed: Unable to perform ROS - Chart review onlyPreop documentation limited or incomplete due to emergent nature of procedure.  Airway        Dental   Pulmonary Current Smoker,           Cardiovascular negative cardio ROS       Neuro/Psych negative neurological ROS  negative psych ROS   GI/Hepatic negative GI ROS, Neg liver ROS,   Endo/Other  negative endocrine ROS  Renal/GU negative Renal ROS  negative genitourinary   Musculoskeletal negative musculoskeletal ROS (+)   Abdominal   Peds negative pediatric ROS (+)  Hematology negative hematology ROS (+)   Anesthesia Other Findings   Reproductive/Obstetrics negative OB ROS                             Anesthesia Physical Anesthesia Plan  ASA: II  Anesthesia Plan: General   Post-op Pain Management:    Induction: Inhalational  Airway Management Planned: Oral ETT  Additional Equipment:   Intra-op Plan:   Post-operative Plan: Post-operative intubation/ventilation  Informed Consent: I have reviewed the patients History and Physical, chart, labs and discussed the procedure including the risks, benefits and alternatives for the proposed anesthesia with the patient or authorized representative who has indicated his/her understanding and acceptance.   History available from chart only and Only emergency history available  Plan Discussed with: CRNA  Anesthesia Plan Comments:         Anesthesia Quick Evaluation
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
## 2016-07-13 NOTE — ED Notes (Signed)
14 og placed clear return

## 2016-07-13 NOTE — ED Notes (Signed)
Pt rolled for inspection

## 2016-07-13 NOTE — Consult Note (Signed)
[facility] 2nd floor [facility]; [facility] care needed. Erline LevineMichael I Karmine Kauer 10:59 PM

## 2016-07-13 NOTE — ED Notes (Signed)
To or 2100
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
## 2016-07-13 NOTE — Transfer of Care (Signed)
Immediate Anesthesia Transfer of Care Note  Patient: Samuel Martin  Procedure(s) Performed: Procedure(s): DIRECT LARYNGOSCOPY (N/A) BILATERAL NECK EXPLORATION NECK AND ARTERY LIGATION WITH PLACEMENT OF PENROSE DRAINS (Bilateral) TRACHEOSTOMY (N/A)  Patient Location: NICU  Anesthesia Type:General  Level of Consciousness: Patient remains intubated per anesthesia plan  Airway & Oxygen Therapy: Patient placed on Ventilator (see vital sign flow sheet for setting)  Post-op Assessment: Report given to RN and Post -op Vital signs reviewed and stable  Post vital signs: Reviewed and stable  Last Vitals:  Vitals:   07/13/16 2035 07/13/16 2056  BP: 142/86 141/87  Pulse: (!) 51 (!) 52  Resp: 14     Last Pain:  Vitals:   07/13/16 1944  PainSc: 10-Worst pain ever         Complications: No apparent anesthesia complications
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
## 2016-07-13 NOTE — ED Notes (Signed)
Intubated by anesthesia successful

## 2016-07-13 NOTE — ED Notes (Signed)
Port chest  Going to c-t for

## 2016-07-13 NOTE — ED Notes (Signed)
To ct

## 2016-07-13 NOTE — ED Notes (Signed)
Dr Corliss Skainstsuei at bedside

## 2016-07-13 NOTE — ED Notes (Signed)
Dr Pollyann Kennedyrosen here
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
## 2016-07-13 NOTE — ED Notes (Signed)
Intubated by anesthesia uinsuccessful
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
## 2016-07-13 NOTE — ED Notes (Signed)
Ems infused 1000cc nss prior to arrival
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
## 2016-07-13 NOTE — ED Notes (Signed)
Family has arrived

## 2016-07-13 NOTE — ED Notes (Signed)
Etomidate 20mg  succ 200mh iv per k munnett rn

## 2016-07-13 NOTE — ED Notes (Signed)
Pt intubated  By ed res attempted

## 2016-07-14 ENCOUNTER — Inpatient Hospital Stay (HOSPITAL_COMMUNITY): Payer: Medicaid Other

## 2016-07-14 ENCOUNTER — Encounter (HOSPITAL_COMMUNITY): Payer: Self-pay | Admitting: Otolaryngology

## 2016-07-14 LAB — MAGNESIUM
MAGNESIUM: 1.5 mg/dL — AB (ref 1.7–2.4)
Magnesium: 1.5 mg/dL — ABNORMAL LOW (ref 1.7–2.4)

## 2016-07-14 LAB — PHOSPHORUS
PHOSPHORUS: 1.9 mg/dL — AB (ref 2.5–4.6)
PHOSPHORUS: 1.3 mg/dL — AB (ref 2.5–4.6)

## 2016-07-14 LAB — BASIC METABOLIC PANEL
ANION GAP: 7 (ref 5–15)
BUN: 6 mg/dL (ref 6–20)
CHLORIDE: 108 mmol/L (ref 101–111)
CO2: 24 mmol/L (ref 22–32)
Calcium: 8.4 mg/dL — ABNORMAL LOW (ref 8.9–10.3)
Creatinine, Ser: 1.12 mg/dL (ref 0.61–1.24)
GFR calc Af Amer: >60 mL/min (ref >60)
GLUCOSE: 97 mg/dL (ref 65–99)
POTASSIUM: 3.7 mmol/L (ref 3.5–5.1)
SODIUM: 139 mmol/L (ref 135–145)

## 2016-07-14 LAB — GLUCOSE, CAPILLARY
Glucose-Capillary: 67 mg/dL (ref 65–99)
Glucose-Capillary: 154 mg/dL — ABNORMAL HIGH (ref 65–99)
Glucose-Capillary: 98 mg/dL (ref 65–99)

## 2016-07-14 LAB — TYPE AND SCREEN
ABO/RH(D): A NEG
Antibody Screen: NEGATIVE
UNIT DIVISION: 0
UNIT DIVISION: 0

## 2016-07-14 LAB — URINALYSIS, ROUTINE W REFLEX MICROSCOPIC
BILIRUBIN URINE: NEGATIVE
Glucose, UA: 100 mg/dL — AB
Hgb urine dipstick: NEGATIVE
KETONES UR: NEGATIVE mg/dL
Leukocytes, UA: NEGATIVE
NITRITE: NEGATIVE
Protein, ur: NEGATIVE mg/dL
Specific Gravity, Urine: 1.02 (ref 1.005–1.030)
pH: 6 (ref 5.0–8.0)

## 2016-07-14 LAB — CDS SEROLOGY

## 2016-07-14 LAB — MRSA PCR SCREENING: MRSA BY PCR: NEGATIVE

## 2016-07-14 MED ORDER — PRO-STAT SUGAR FREE PO LIQD
30.0000 mL | Freq: Two times a day (BID) | ORAL | Status: DC
  Filled 2016-07-14: qty 30

## 2016-07-14 MED ORDER — VITAMIN C 500 MG PO TABS
1000.0000 mg | ORAL_TABLET | Freq: Three times a day (TID) | ORAL | Status: AC
  Administered 2016-07-14 – 2016-07-20 (×21): 1000 mg
  Filled 2016-07-14 (×21): qty 2

## 2016-07-14 MED ORDER — VITAL HIGH PROTEIN PO LIQD: 1000.0000 mL | ORAL | Status: DC

## 2016-07-14 MED ORDER — HYDROCODONE-ACETAMINOPHEN 7.5-325 MG/15ML PO SOLN
15.0000 mL | ORAL | Status: DC | PRN
  Administered 2016-07-15 – 2016-07-16 (×5): 15 mL
  Filled 2016-07-14 (×5): qty 15

## 2016-07-14 MED ORDER — PIVOT 1.5 CAL PO LIQD
1000.0000 mL | ORAL | Status: DC
  Administered 2016-07-14 – 2016-07-20 (×10): 1000 mL
  Filled 2016-07-14 (×13): qty 1000

## 2016-07-14 MED ORDER — SELENIUM 50 MCG PO TABS
200.0000 ug | ORAL_TABLET | Freq: Every day | ORAL | Status: AC
  Administered 2016-07-14 – 2016-07-20 (×7): 200 ug
  Filled 2016-07-14 (×7): qty 4

## 2016-07-14 NOTE — Progress Notes (Signed)
Patient ID: Otelia SergeantRashon XXXFisher, male   DOB: 09/19/1996, 20 y.o.   MRN: [id number] I spoke with his mother by phone and updated her regarding the plan of care. Violeta GelinasBurke Dylyn Mclaren, MD, MPH, FACS Trauma: [phone] General Surgery: [phone]
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
## 2016-07-15 LAB — CBC
HCT: 31.9 % — ABNORMAL LOW (ref 39.0–52.0)
Hemoglobin: 10.5 g/dL — ABNORMAL LOW (ref 13.0–17.0)
MCH: 29.7 pg (ref 26.0–34.0)
MCHC: 32.9 g/dL (ref 30.0–36.0)
MCV: 90.1 fL (ref 78.0–100.0)
PLATELETS: 71 10*3/uL — AB (ref 150–400)
RBC: 3.54 MIL/uL — AB (ref 4.22–5.81)
RDW: 13.6 % (ref 11.5–15.5)
WBC: 8.2 10*3/uL (ref 4.0–10.5)

## 2016-07-15 LAB — GLUCOSE, CAPILLARY
GLUCOSE-CAPILLARY: 141 mg/dL — AB (ref 65–99)
GLUCOSE-CAPILLARY: 121 mg/dL — AB (ref 65–99)
GLUCOSE-CAPILLARY: 133 mg/dL — AB (ref 65–99)
GLUCOSE-CAPILLARY: 136 mg/dL — AB (ref 65–99)
Glucose-Capillary: 104 mg/dL — ABNORMAL HIGH (ref 65–99)
Glucose-Capillary: 130 mg/dL — ABNORMAL HIGH (ref 65–99)
Glucose-Capillary: 136 mg/dL — ABNORMAL HIGH (ref 65–99)

## 2016-07-15 LAB — PHOSPHORUS
PHOSPHORUS: 2.8 mg/dL (ref 2.5–4.6)
PHOSPHORUS: 2.8 mg/dL (ref 2.5–4.6)

## 2016-07-15 LAB — MAGNESIUM
MAGNESIUM: 1.6 mg/dL — AB (ref 1.7–2.4)
MAGNESIUM: 2 mg/dL (ref 1.7–2.4)

## 2016-07-15 LAB — BLOOD PRODUCT ORDER (VERBAL) VERIFICATION

## 2016-07-15 MED ORDER — FREE WATER
100.0000 mL | Freq: Three times a day (TID) | Status: DC
  Administered 2016-07-15 – 2016-07-21 (×18): 100 mL

## 2016-07-15 MED ORDER — FENTANYL CITRATE (PF) 100 MCG/2ML IJ SOLN
25.0000 ug | INTRAMUSCULAR | Status: DC | PRN
  Administered 2016-07-15: 25 ug via INTRAVENOUS
  Administered 2016-07-15 – 2016-07-17 (×5): 50 ug via INTRAVENOUS
  Filled 2016-07-15 (×6): qty 2

## 2016-07-15 NOTE — Progress Notes (Signed)
Pt remains off vent and on trach collar. No distress noted at this time.

## 2016-07-15 NOTE — Progress Notes (Signed)
Pt remains on trach collar at this time.

## 2016-07-15 NOTE — Progress Notes (Signed)
Patient ID: Samuel Martin, male   DOB: 06/12/1996, 20 y.o.   MRN: [id number] Neck looks good. Both drains removed. Remove staples in 1 week. Continue weaning off ventilator and we can start to progress towards decannulation in about 3 or 4 more days if clinically ready.

## 2016-07-15 NOTE — Progress Notes (Signed)
Wasted 35cc of Fentanyl gtt with Francoise SchaumannSarah Hocutt, RN

## 2016-07-15 NOTE — Anesthesia Postprocedure Evaluation (Signed)
Anesthesia Post Note  Patient: Samuel Martin  Procedure(s) Performed: Procedure(s) (LRB): DIRECT LARYNGOSCOPY (N/A) BILATERAL NECK EXPLORATION NECK AND ARTERY LIGATION WITH PLACEMENT OF PENROSE DRAINS (Bilateral) TRACHEOSTOMY (N/A)  Patient location during evaluation: SICU Anesthesia Type: General Level of consciousness: sedated Pain management: pain level controlled Vital Signs Assessment: post-procedure vital signs reviewed and stable Respiratory status: patient on ventilator - see flowsheet for VS and patient connected to tracheostomy mask oxygen Cardiovascular status: stable Anesthetic complications: no    Last Vitals:  Vitals:   07/15/16 0500 07/15/16 0600  BP: (!) 150/76 (!) 153/64  Pulse: 73 65  Resp: 16 14  Temp:      Last Pain:  Vitals:   07/15/16 0400  TempSrc: Axillary  PainSc:                  Shelton SilvasKevin D Kaidin Boehle
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
## 2016-07-15 NOTE — Progress Notes (Signed)
Orthopedic Tech Progress Note Patient Details:  Samuel Martin 09/18/1996 [id number]  Patient ID: Samuel Sergeantashon Martin, male   DOB: 10/02/1996, 20 y.o.   MRN: [id number]   Saul FordyceJennifer C Seymone Forlenza 07/15/2016, 10:11 [facility] left Hinged knee brace.

## 2016-07-15 NOTE — Progress Notes (Signed)
Rt placed pt on Trach Collar per MD order. Pt doing well at this time.

## 2016-07-15 NOTE — Plan of Care (Signed)
Problem: ICU Phase Progression Outcomes Goal: Voiding-avoid urinary catheter unless indicated Outcome: Not Met (add Reason) Urinary retention
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
## 2016-07-16 DIAGNOSIS — J9600 Acute respiratory failure, unspecified whether with hypoxia or hypercapnia: Secondary | ICD-10-CM | POA: Diagnosis present

## 2016-07-16 DIAGNOSIS — R338 Other retention of urine: Secondary | ICD-10-CM | POA: Diagnosis not present

## 2016-07-16 DIAGNOSIS — S7292XA Unspecified fracture of left femur, initial encounter for closed fracture: Secondary | ICD-10-CM | POA: Diagnosis present

## 2016-07-16 DIAGNOSIS — D696 Thrombocytopenia, unspecified: Secondary | ICD-10-CM | POA: Diagnosis not present

## 2016-07-16 DIAGNOSIS — W3400XA Accidental discharge from unspecified firearms or gun, initial encounter: Secondary | ICD-10-CM

## 2016-07-16 DIAGNOSIS — S71139A Puncture wound without foreign body, unspecified thigh, initial encounter: Secondary | ICD-10-CM

## 2016-07-16 DIAGNOSIS — D62 Acute posthemorrhagic anemia: Secondary | ICD-10-CM | POA: Diagnosis not present

## 2016-07-16 LAB — GLUCOSE, CAPILLARY
Glucose-Capillary: 111 mg/dL — ABNORMAL HIGH (ref 65–99)
Glucose-Capillary: 126 mg/dL — ABNORMAL HIGH (ref 65–99)
Glucose-Capillary: 138 mg/dL — ABNORMAL HIGH (ref 65–99)
Glucose-Capillary: 98 mg/dL (ref 65–99)
Glucose-Capillary: 135 mg/dL — ABNORMAL HIGH (ref 65–99)

## 2016-07-16 LAB — BASIC METABOLIC PANEL
Anion gap: 8 (ref 5–15)
BUN: 8 mg/dL (ref 6–20)
CHLORIDE: 101 mmol/L (ref 101–111)
CO2: 26 mmol/L (ref 22–32)
Calcium: 8.8 mg/dL — ABNORMAL LOW (ref 8.9–10.3)
Creatinine, Ser: 0.9 mg/dL (ref 0.61–1.24)
GFR calc Af Amer: >60 mL/min (ref >60)
GFR calc non Af Amer: >60 mL/min (ref >60)
GLUCOSE: 144 mg/dL — AB (ref 65–99)
POTASSIUM: 4 mmol/L (ref 3.5–5.1)
Sodium: 135 mmol/L (ref 135–145)

## 2016-07-16 LAB — CBC
HCT: 37.1 % — ABNORMAL LOW (ref 39.0–52.0)
HEMOGLOBIN: 12.1 g/dL — AB (ref 13.0–17.0)
MCH: 29.6 pg (ref 26.0–34.0)
MCHC: 32.6 g/dL (ref 30.0–36.0)
MCV: 90.7 fL (ref 78.0–100.0)
Platelets: 89 10*3/uL — ABNORMAL LOW (ref 150–400)
RBC: 4.09 MIL/uL — AB (ref 4.22–5.81)
RDW: 13.4 % (ref 11.5–15.5)
WBC: 9.7 10*3/uL (ref 4.0–10.5)

## 2016-07-16 MED ORDER — HYDROMORPHONE HCL 1 MG/ML IJ SOLN: 0.5000 mg | INTRAMUSCULAR | Status: DC | PRN

## 2016-07-16 MED ORDER — HYDROCODONE-ACETAMINOPHEN 7.5-325 MG/15ML PO SOLN
10.0000 mL | ORAL | Status: DC | PRN
  Administered 2016-07-16 – 2016-07-17 (×4): 15 mL via ORAL
  Administered 2016-07-17: 20 mL via ORAL
  Administered 2016-07-18 (×4): 15 mL via ORAL
  Administered 2016-07-19 (×2): 20 mL via ORAL
  Administered 2016-07-20 – 2016-07-21 (×4): 15 mL via ORAL
  Filled 2016-07-16: qty 15
  Filled 2016-07-16 (×2): qty 30
  Filled 2016-07-16 (×3): qty 15
  Filled 2016-07-16: qty 30
  Filled 2016-07-16: qty 15
  Filled 2016-07-16: qty 30
  Filled 2016-07-16 (×7): qty 15

## 2016-07-16 MED ORDER — BETHANECHOL CHLORIDE 25 MG PO TABS
25.0000 mg | ORAL_TABLET | Freq: Four times a day (QID) | ORAL | Status: DC
  Administered 2016-07-16 – 2016-07-17 (×7): 25 mg
  Filled 2016-07-16 (×4): qty 1
  Filled 2016-07-16 (×4): qty 3

## 2016-07-16 MED ORDER — POLYETHYLENE GLYCOL 3350 17 G PO PACK
17.0000 g | PACK | Freq: Every day | ORAL | Status: DC
  Administered 2016-07-16 – 2016-07-21 (×6): 17 g
  Filled 2016-07-16 (×6): qty 1

## 2016-07-16 MED ORDER — CHLORHEXIDINE GLUCONATE 0.12 % MT SOLN
OROMUCOSAL | Status: AC
  Administered 2016-07-16: 15 mL via OROMUCOSAL
  Filled 2016-07-16: qty 15

## 2016-07-16 MED ORDER — BETHANECHOL 1 MG/ML PEDIATRIC ORAL SUSPENSION
25.0000 mg | Freq: Four times a day (QID) | ORAL | Status: DC
  Filled 2016-07-16 (×2): qty 25
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
## 2016-07-17 ENCOUNTER — Encounter (HOSPITAL_COMMUNITY): Payer: Self-pay | Admitting: *Deleted

## 2016-07-17 LAB — GLUCOSE, CAPILLARY
GLUCOSE-CAPILLARY: 103 mg/dL — AB (ref 65–99)
GLUCOSE-CAPILLARY: 115 mg/dL — AB (ref 65–99)
GLUCOSE-CAPILLARY: 133 mg/dL — AB (ref 65–99)
GLUCOSE-CAPILLARY: 161 mg/dL — AB (ref 65–99)
Glucose-Capillary: 109 mg/dL — ABNORMAL HIGH (ref 65–99)
Glucose-Capillary: 137 mg/dL — ABNORMAL HIGH (ref 65–99)

## 2016-07-17 LAB — CBC
HCT: 37.9 % — ABNORMAL LOW (ref 39.0–52.0)
HEMOGLOBIN: 12.4 g/dL — AB (ref 13.0–17.0)
MCH: 29.5 pg (ref 26.0–34.0)
MCHC: 32.7 g/dL (ref 30.0–36.0)
MCV: 90 fL (ref 78.0–100.0)
PLATELETS: 122 10*3/uL — AB (ref 150–400)
RBC: 4.21 MIL/uL — ABNORMAL LOW (ref 4.22–5.81)
RDW: 13.5 % (ref 11.5–15.5)
WBC: 8.1 10*3/uL (ref 4.0–10.5)

## 2016-07-17 MED ORDER — PANTOPRAZOLE SODIUM 40 MG PO PACK
40.0000 mg | PACK | Freq: Two times a day (BID) | ORAL | Status: DC
  Administered 2016-07-17 – 2016-07-21 (×8): 40 mg
  Filled 2016-07-17 (×8): qty 20

## 2016-07-17 MED ORDER — INFLUENZA VAC SPLIT QUAD 0.5 ML IM SUSY
0.5000 mL | PREFILLED_SYRINGE | INTRAMUSCULAR | Status: AC
  Administered 2016-07-22: 0.5 mL via INTRAMUSCULAR
  Filled 2016-07-17: qty 0.5

## 2016-07-17 MED ORDER — TRAMADOL HCL 50 MG PO TABS
100.0000 mg | ORAL_TABLET | Freq: Four times a day (QID) | ORAL | Status: DC
  Administered 2016-07-17 – 2016-07-21 (×16): 100 mg
  Filled 2016-07-17 (×16): qty 2

## 2016-07-17 MED ORDER — ENOXAPARIN SODIUM 40 MG/0.4ML ~~LOC~~ SOLN
40.0000 mg | Freq: Every day | SUBCUTANEOUS | Status: DC
  Administered 2016-07-17 – 2016-07-23 (×7): 40 mg via SUBCUTANEOUS
  Filled 2016-07-17 (×7): qty 0.4

## 2016-07-17 MED ORDER — MENTHOL 3 MG MT LOZG: 1.0000 | LOZENGE | OROMUCOSAL | Status: DC | PRN

## 2016-07-17 NOTE — Progress Notes (Signed)
07/17/2016 9:36 AM  Otelia SergeantXXXFisher, Samuel [id number]  Post-Op Day 4    Temp:  [98 F (36.7 C)-100.4 F (38 C)] 98.2 F (36.8 C) (10/05 0819) Pulse Rate:  [66-86] 71 (10/05 0819) Resp:  [16-24] 17 (10/05 0819) BP: (114-160)/(76-101) 155/82 (10/05 0819) SpO2:  [100 %] 100 % (10/05 0819) FiO2 (%):  [28 %] 28 % (10/05 0819) Weight:  [79 kg (174 lb 2.6 oz)-79.2 kg (174 lb 9.7 oz)] 79.2 kg (174 lb 9.7 oz) (10/05 0347),     Intake/Output Summary (Last 24 hours) at 07/17/16 0936 Last data filed at 07/17/16 0906  Gross per 24 hour  Intake           1827.5 ml  Output             1350 ml  Net            477.5 ml    Results for orders placed or performed during the hospital encounter of 07/13/16 (from the past 24 hour(s))  Glucose, capillary     Status: Abnormal   Collection Time: 07/16/16 11:43 AM  Result Value Ref Range   Glucose-Capillary 138 (H) 65 - 99 mg/dL   Comment 1 Notify RN    Comment 2 Document in Chart   Glucose, capillary     Status: None   Collection Time: 07/16/16  3:58 PM  Result Value Ref Range   Glucose-Capillary 98 65 - 99 mg/dL   Comment 1 Notify RN    Comment 2 Document in Chart   Glucose, capillary     Status: Abnormal   Collection Time: 07/16/16  7:54 PM  Result Value Ref Range   Glucose-Capillary 135 (H) 65 - 99 mg/dL  Glucose, capillary     Status: Abnormal   Collection Time: 07/17/16 12:58 AM  Result Value Ref Range   Glucose-Capillary 103 (H) 65 - 99 mg/dL  Glucose, capillary     Status: Abnormal   Collection Time: 07/17/16  3:46 AM  Result Value Ref Range   Glucose-Capillary 161 (H) 65 - 99 mg/dL  CBC     Status: Abnormal   Collection Time: 07/17/16  4:51 AM  Result Value Ref Range   WBC 8.1 4.0 - 10.5 K/uL   RBC 4.21 (L) 4.22 - 5.81 MIL/uL   Hemoglobin 12.4 (L) 13.0 - 17.0 g/dL   HCT 40.937.9 (L) 81.139.0 - 91.452.0 %   MCV 90.0 78.0 - 100.0 fL   MCH 29.5 26.0 - 34.0 pg   MCHC 32.7 30.0 - 36.0 g/dL   RDW 78.213.5 95.611.5 - 21.315.5 %   Platelets 122 (L) 150 -  400 K/uL  Glucose, capillary     Status: Abnormal   Collection Time: 07/17/16  8:53 AM  Result Value Ref Range   Glucose-Capillary 109 (H) 65 - 99 mg/dL   Comment 1 Notify RN    Comment 2 Document in Chart     SUBJECTIVE:  Minimally communicative.  Poor speech with PMV yesterday with Speech Path.  OBJECTIVE:  Trach clean and secure.  Breathing well.neck mod swollen, but clean.  Some trismus.  Tongue still somewhat swollen  IMPRESSION:  Satisfactory check  PLAN:  Will downsize trach tomorrow. Consider decannulation when Speech Path feels speech and breathing are improved.   Staples out of neck next week.    Flo ShanksWOLICKI, Manie Bealer
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
## 2016-07-18 NOTE — Progress Notes (Signed)
Pt transfer from 3 south step down, on t-collar 28%, dobhoff via oral with continuos tube feeding, IV saline lock, foley cath, left knee brace, neck with dressing, alert oriented will continue to monitor.
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
## 2016-07-19 MED ORDER — WHITE PETROLATUM GEL
Status: AC
  Administered 2016-07-19: 20:00:00
  Filled 2016-07-19: qty 1
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
## 2016-07-19 NOTE — Progress Notes (Signed)
07/19/2016 2:38 PM  Samuel Martin, Samuel Martin [id number]  Post-Op Day 6    Temp:  [97.7 F (36.5 C)-98.9 F (37.2 C)] 98.3 F (36.8 C) (10/07 1404) Pulse Rate:  [82-92] 92 (10/07 1404) Resp:  [18-20] 18 (10/07 1404) BP: (133-161)/(66-87) 161/77 (10/07 1404) SpO2:  [66 %-100 %] 100 % (10/07 1404) FiO2 (%):  [28 %] 28 % (10/07 1140) Weight:  [82.9 kg (182 lb 12.2 oz)] 82.9 kg (182 lb 12.2 oz) (10/07 0500),     Intake/Output Summary (Last 24 hours) at 07/19/16 1438 Last data filed at 07/19/16 1436  Gross per 24 hour  Intake          2502.16 ml  Output             1975 ml  Net           527.16 ml    No results found for this or any previous visit (from the past 24 hour(s)).  SUBJECTIVE:  Breathing OK.  OBJECTIVE:   Apparently aspiration tube feedings through trach.  Less neck swelling.  Still mod-lg tongue swelling.  Downsized to #4 cuffless Shiley trach tube.    IMPRESSION:  Slow improvement in oral and oropharyngeal swelling.   PLAN:  Changed trach as above.  Will ask for Modified Ba swallow, and Passey Muir valve teaching.  Could decannulate altogether when airway seems good. Staples out of both sides of neck in 3 more days.  Flo ShanksWOLICKI, Samuel Martin

## 2016-07-20 NOTE — Progress Notes (Signed)
Pt able to talk some today. Took a little bit of flavored ice today and did well with that.  Moderate amount of secretions from trach.
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
## 2016-07-21 ENCOUNTER — Inpatient Hospital Stay (HOSPITAL_COMMUNITY): Payer: Medicaid Other

## 2016-07-21 MED ORDER — TRAMADOL HCL 50 MG PO TABS
100.0000 mg | ORAL_TABLET | Freq: Four times a day (QID) | ORAL | Status: DC
  Administered 2016-07-21 – 2016-07-23 (×9): 100 mg via ORAL
  Filled 2016-07-21 (×9): qty 2

## 2016-07-21 MED ORDER — PANTOPRAZOLE SODIUM 40 MG PO PACK
40.0000 mg | PACK | Freq: Two times a day (BID) | ORAL | Status: DC
  Administered 2016-07-21: 40 mg via ORAL
  Filled 2016-07-21 (×2): qty 20

## 2016-07-21 MED ORDER — BETHANECHOL CHLORIDE 25 MG PO TABS: 25.0000 mg | ORAL_TABLET | Freq: Four times a day (QID) | ORAL | Status: DC

## 2016-07-21 MED ORDER — DOCUSATE SODIUM 100 MG PO CAPS
100.0000 mg | ORAL_CAPSULE | Freq: Two times a day (BID) | ORAL | Status: DC
  Administered 2016-07-22: 100 mg via ORAL
  Filled 2016-07-21 (×4): qty 1

## 2016-07-21 MED ORDER — RESOURCE THICKENUP CLEAR PO POWD
ORAL | Status: DC | PRN
  Filled 2016-07-21: qty 125

## 2016-07-21 MED ORDER — POLYETHYLENE GLYCOL 3350 17 G PO PACK
17.0000 g | PACK | Freq: Every day | ORAL | Status: DC
  Administered 2016-07-22 – 2016-07-23 (×2): 17 g via ORAL
  Filled 2016-07-21 (×2): qty 1

## 2016-07-21 MED ORDER — BETHANECHOL CHLORIDE 25 MG PO TABS
25.0000 mg | ORAL_TABLET | Freq: Four times a day (QID) | ORAL | Status: DC
Start: 2016-07-21 — End: 2016-07-23
  Administered 2016-07-21 – 2016-07-23 (×9): 25 mg via ORAL
  Filled 2016-07-21 (×9): qty 1

## 2016-07-21 MED ORDER — BETHANECHOL 1 MG/ML PEDIATRIC ORAL SUSPENSION: 25.0000 mg | Freq: Four times a day (QID) | ORAL | Status: DC

## 2016-07-21 MED ORDER — HYDROCODONE-ACETAMINOPHEN 10-325 MG PO TABS
0.5000 | ORAL_TABLET | ORAL | Status: DC | PRN
  Administered 2016-07-22 (×2): 1 via ORAL
  Administered 2016-07-22: 2 via ORAL
  Administered 2016-07-23: 1 via ORAL
  Filled 2016-07-21: qty 1
  Filled 2016-07-21: qty 2
  Filled 2016-07-21: qty 1
  Filled 2016-07-21: qty 2
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
## 2016-07-21 NOTE — Progress Notes (Signed)
1400 OG tube removed, TF d/c'd. Pt had a Ba swallow completed. 1800 Pt is tolerating mech soft diet, small portion, tolerating honey thickened liquids.
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
## 2016-07-21 NOTE — Progress Notes (Signed)
MBSS complete. Full report located under chart review in imaging section. Recommend Dys 3/honey thick liquids with PMSV in place. Harlon DittyBonnie Chizaram Latino, MA CCC-SLP [phone]
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
## 2016-07-22 ENCOUNTER — Inpatient Hospital Stay (HOSPITAL_COMMUNITY): Payer: Medicaid Other

## 2016-07-22 ENCOUNTER — Encounter (HOSPITAL_COMMUNITY): Payer: Self-pay | Admitting: Surgery

## 2016-07-22 MED ORDER — PANTOPRAZOLE SODIUM 40 MG PO TBEC
40.0000 mg | DELAYED_RELEASE_TABLET | Freq: Two times a day (BID) | ORAL | Status: DC
  Administered 2016-07-22 – 2016-07-23 (×3): 40 mg via ORAL
  Filled 2016-07-22 (×3): qty 1

## 2016-07-22 MED ORDER — TAMSULOSIN HCL 0.4 MG PO CAPS
0.8000 mg | ORAL_CAPSULE | Freq: Every day | ORAL | Status: DC
  Administered 2016-07-22 – 2016-07-23 (×2): 0.8 mg via ORAL
  Filled 2016-07-22 (×2): qty 2
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
## 2016-07-22 NOTE — Progress Notes (Signed)
Trach capped by RT. Pt tolerating well, SATs remain 97% on room air. RT will continue to monitor.
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
## 2016-07-22 NOTE — Progress Notes (Signed)
07/22/2016 1:07 PM  Samuel SergeantXXXFisher, Samuel Martin [id number]  Post-Op Day 8    Temp:  [98 F (36.7 C)-98.7 F (37.1 C)] 98.7 F (37.1 C) (10/10 0405) Pulse Rate:  [84-99] 84 (10/10 1227) Resp:  [14-20] 16 (10/10 1227) BP: (126-153)/(70-88) 126/88 (10/10 0405) SpO2:  [94 %-99 %] 99 % (10/10 1227) FiO2 (%):  [21 %] 21 % (10/10 0829) Weight:  [81.1 kg (178 lb 12.7 oz)] 81.1 kg (178 lb 12.7 oz) (10/10 0405),     Intake/Output Summary (Last 24 hours) at 07/22/16 1307 Last data filed at 07/22/16 0928  Gross per 24 hour  Intake              900 ml  Output             1100 ml  Net             -200 ml    No results found for this or any previous visit (from the past 24 hour(s)).  SUBJECTIVE:  Voicing and breathing with trach plugged.  Eating po with OG tube gone.  OBJECTIVE:  Mouth less swollen.  Voice weak but phonatory.  Neck with min swelling.  IMPRESSION:  Satisfactory check  PLAN:  If he can tolerated trach plugged overnight, I will remove it in the AM.  I will also remove his neck staples.    Samuel Martin, Samuel Martin

## 2016-07-23 ENCOUNTER — Inpatient Hospital Stay (HOSPITAL_COMMUNITY): Payer: Medicaid Other

## 2016-07-23 MED ORDER — HYDROCODONE-ACETAMINOPHEN 10-325 MG PO TABS: 1.0000 | ORAL_TABLET | ORAL | 0 refills | Status: DC | PRN

## 2016-07-23 MED ORDER — TRAMADOL HCL 50 MG PO TABS: 100.0000 mg | ORAL_TABLET | Freq: Four times a day (QID) | ORAL | 0 refills | Status: DC

## 2016-07-23 NOTE — Progress Notes (Signed)
07/23/2016 11:43 AM  Samuel Martin, Samuel Martin [id number]  Post-Op Day 10    Temp:  [98 F (36.7 C)-98.7 F (37.1 C)] 98.7 F (37.1 C) (10/10 2142) Pulse Rate:  [84-95] 84 (10/11 0729) Resp:  [14-19] 16 (10/11 0729) BP: (139-149)/(70-72) 139/72 (10/10 2142) SpO2:  [98 %-100 %] 98 % (10/11 0729) Weight:  [76.2 kg (167 lb 15.9 oz)] 76.2 kg (167 lb 15.9 oz) (10/11 0508),     Intake/Output Summary (Last 24 hours) at 07/23/16 1143 Last data filed at 07/23/16 1130  Gross per 24 hour  Intake             1020 ml  Output             1800 ml  Net             -780 ml    No results found for this or any previous visit (from the past 24 hour(s)).  SUBJECTIVE:  Breathing well with trach plugged.  Eating well  OBJECTIVE:  Less tongue and neck swelling  IMPRESSION:  improving  PLAN:  Trach removed.  Staples removed.  Ready for discharge from my standpoint.  Recheck Dr. Pollyann Kennedyosen 1-2 weeks please, [phone].  Samuel Martin, Samuel Martin
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
## 2016-07-23 NOTE — Care Management Note (Signed)
Case Management Note  Patient Details  Name: Samuel Martin MRN: [id number] Date of Birth: 09/04/1996  Subjective/Objective:   Pt medically stable for discharge today.  Pt decannulated; dry dressing on trach site.                   Action/Plan: Pt to dc home with mother as prior to admission.  [facility]  DME to be delivered to pt's room prior to dc.    Expected Discharge Date:     07/23/16             Expected Discharge Plan:  Home/Self Care  In-House Referral:  Clinical Social Work  Discharge planning Services  CM Consult  Post Acute Care Choice:  Durable Medical Equipment Choice offered to:     DME Arranged:  3-N-1, Tub bench, Walker rolling, Wheelchair manual DME Agency:  [facility]  HH Arranged:    HH Agency:     Status of Service:  Completed, signed off  If discussed at [employer] Meetings, dates discussed:    Additional Comments:  Quintella BatonJulie W. Stanislawa Gaffin, RN, BSN  Trauma/Neuro ICU Case Manager [phone]
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
## 2016-07-23 NOTE — Progress Notes (Signed)
Pt Discharged. Discharge information given he understands. Supplies sent home with him and equipment: wheelchair, walker, Shower seat. Leave with Brace on left leg.

## 2016-07-23 NOTE — Progress Notes (Signed)
MBSS complete. Full report located under chart review in imaging section. Will upgrade diet to regular/thin.  Harlon DittyBonnie Michael Walrath, MA CCC-SLP [phone]

## 2016-07-24 ENCOUNTER — Encounter (HOSPITAL_COMMUNITY): Payer: Self-pay | Admitting: Psychiatry

## 2016-08-05 ENCOUNTER — Encounter (HOSPITAL_COMMUNITY): Payer: Self-pay | Admitting: Emergency Medicine

## 2016-08-05 ENCOUNTER — Emergency Department (HOSPITAL_COMMUNITY): Payer: Medicaid Other

## 2016-08-05 DIAGNOSIS — B9689 Other specified bacterial agents as the cause of diseases classified elsewhere: Secondary | ICD-10-CM | POA: Insufficient documentation

## 2016-08-05 DIAGNOSIS — L02416 Cutaneous abscess of left lower limb: Secondary | ICD-10-CM | POA: Insufficient documentation

## 2016-08-05 DIAGNOSIS — M00862 Arthritis due to other bacteria, left knee: Secondary | ICD-10-CM | POA: Insufficient documentation

## 2016-08-05 DIAGNOSIS — F17200 Nicotine dependence, unspecified, uncomplicated: Secondary | ICD-10-CM | POA: Insufficient documentation

## 2016-08-05 LAB — CBC WITH DIFFERENTIAL/PLATELET
BASOS PCT: 0 %
Basophils Absolute: 0 10*3/uL (ref 0.0–0.1)
EOS ABS: 0.1 10*3/uL (ref 0.0–0.7)
Eosinophils Relative: 1 %
HCT: 37.5 % — ABNORMAL LOW (ref 39.0–52.0)
HEMOGLOBIN: 12.1 g/dL — AB (ref 13.0–17.0)
LYMPHS ABS: 1.9 10*3/uL (ref 0.7–4.0)
Lymphocytes Relative: 22 %
MCH: 29.1 pg (ref 26.0–34.0)
MCHC: 32.3 g/dL (ref 30.0–36.0)
MCV: 90.1 fL (ref 78.0–100.0)
Monocytes Absolute: 0.7 10*3/uL (ref 0.1–1.0)
Monocytes Relative: 7 %
NEUTROS PCT: 70 %
Neutro Abs: 6.2 10*3/uL (ref 1.7–7.7)
Platelets: 247 10*3/uL (ref 150–400)
RBC: 4.16 MIL/uL — AB (ref 4.22–5.81)
RDW: 13.9 % (ref 11.5–15.5)
WBC: 8.9 10*3/uL (ref 4.0–10.5)

## 2016-08-05 LAB — COMPREHENSIVE METABOLIC PANEL
ALBUMIN: 4 g/dL (ref 3.5–5.0)
ALK PHOS: 96 U/L (ref 38–126)
ALT: 42 U/L (ref 17–63)
AST: 36 U/L (ref 15–41)
Anion gap: 8 (ref 5–15)
BUN: 12 mg/dL (ref 6–20)
CALCIUM: 9.7 mg/dL (ref 8.9–10.3)
CO2: 30 mmol/L (ref 22–32)
CREATININE: 0.98 mg/dL (ref 0.61–1.24)
Chloride: 100 mmol/L — ABNORMAL LOW (ref 101–111)
GFR calc Af Amer: >60 mL/min (ref >60)
GFR calc non Af Amer: >60 mL/min (ref >60)
GLUCOSE: 104 mg/dL — AB (ref 65–99)
Potassium: 4.3 mmol/L (ref 3.5–5.1)
SODIUM: 138 mmol/L (ref 135–145)
Total Bilirubin: 0.5 mg/dL (ref 0.3–1.2)
Total Protein: 7.6 g/dL (ref 6.5–8.1)
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
## 2016-08-06 ENCOUNTER — Emergency Department (HOSPITAL_COMMUNITY)
Admission: EM | Admit: 2016-08-06 | Discharge: 2016-08-06 | Discharge status: Against Medical Advice | Payer: Medicaid Other | Attending: Emergency Medicine | Admitting: Emergency Medicine

## 2016-08-06 DIAGNOSIS — L0291 Cutaneous abscess, unspecified: Secondary | ICD-10-CM

## 2016-08-06 DIAGNOSIS — M009 Pyogenic arthritis, unspecified: Secondary | ICD-10-CM

## 2016-08-06 HISTORY — DX: Unspecified firearm discharge, undetermined intent, initial encounter: Y24.9XXA

## 2016-08-06 HISTORY — DX: Accidental discharge from unspecified firearms or gun, initial encounter: W34.00XA

## 2016-08-06 MED ORDER — DOXYCYCLINE HYCLATE 100 MG PO TABS
100.0000 mg | ORAL_TABLET | Freq: Once | ORAL | Status: AC
  Administered 2016-08-06: 100 mg via ORAL
  Filled 2016-08-06: qty 1

## 2016-08-06 MED ORDER — CEPHALEXIN 250 MG PO CAPS
500.0000 mg | ORAL_CAPSULE | Freq: Once | ORAL | Status: AC
  Administered 2016-08-06: 500 mg via ORAL
  Filled 2016-08-06: qty 2

## 2016-08-06 MED ORDER — LIDOCAINE-EPINEPHRINE 1 %-1:100000 IJ SOLN
10.0000 mL | Freq: Once | INTRAMUSCULAR | Status: DC
  Filled 2016-08-06: qty 1

## 2016-08-06 MED ORDER — CEPHALEXIN 500 MG PO CAPS: 500.0000 mg | ORAL_CAPSULE | Freq: Two times a day (BID) | ORAL | 0 refills | Status: DC

## 2016-08-06 MED ORDER — DOXYCYCLINE HYCLATE 100 MG PO CAPS: 100.0000 mg | ORAL_CAPSULE | Freq: Two times a day (BID) | ORAL | 0 refills | Status: DC
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
## 2017-01-26 ENCOUNTER — Encounter (HOSPITAL_COMMUNITY): Payer: Self-pay | Admitting: Emergency Medicine

## 2017-01-26 ENCOUNTER — Emergency Department (HOSPITAL_COMMUNITY)
Admission: EM | Admit: 2017-01-26 | Discharge: 2017-01-27 | Disposition: A | Discharge status: Other Acute Inpatient Hospital | Payer: Federal, State, Local not specified - Other | Attending: Emergency Medicine | Admitting: Emergency Medicine

## 2017-01-26 ENCOUNTER — Emergency Department (HOSPITAL_COMMUNITY)
Admission: EM | Admit: 2017-01-26 | Discharge: 2017-01-26 | Discharge status: Against Medical Advice | Payer: Medicaid Other

## 2017-01-26 DIAGNOSIS — Z79899 Other long term (current) drug therapy: Secondary | ICD-10-CM | POA: Insufficient documentation

## 2017-01-26 DIAGNOSIS — F23 Brief psychotic disorder: Secondary | ICD-10-CM

## 2017-01-26 DIAGNOSIS — R443 Hallucinations, unspecified: Secondary | ICD-10-CM

## 2017-01-26 DIAGNOSIS — F17200 Nicotine dependence, unspecified, uncomplicated: Secondary | ICD-10-CM | POA: Insufficient documentation

## 2017-01-26 DIAGNOSIS — F12951 Cannabis use, unspecified with psychotic disorder with hallucinations: Secondary | ICD-10-CM | POA: Diagnosis present

## 2017-01-26 LAB — RAPID URINE DRUG SCREEN, HOSP PERFORMED
AMPHETAMINES: NOT DETECTED
BARBITURATES: NOT DETECTED
Benzodiazepines: NOT DETECTED
Cocaine: NOT DETECTED
Opiates: NOT DETECTED
TETRAHYDROCANNABINOL: POSITIVE — AB

## 2017-01-26 LAB — COMPREHENSIVE METABOLIC PANEL
ALT: 23 U/L (ref 17–63)
ANION GAP: 9 (ref 5–15)
AST: 38 U/L (ref 15–41)
Albumin: 4.9 g/dL (ref 3.5–5.0)
Alkaline Phosphatase: 90 U/L (ref 38–126)
BILIRUBIN TOTAL: 2 mg/dL — AB (ref 0.3–1.2)
BUN: 19 mg/dL (ref 6–20)
CHLORIDE: 102 mmol/L (ref 101–111)
CO2: 25 mmol/L (ref 22–32)
Calcium: 9.4 mg/dL (ref 8.9–10.3)
Creatinine, Ser: 0.94 mg/dL (ref 0.61–1.24)
Glucose, Bld: 89 mg/dL (ref 65–99)
POTASSIUM: 3.8 mmol/L (ref 3.5–5.1)
Sodium: 136 mmol/L (ref 135–145)
TOTAL PROTEIN: 8.1 g/dL (ref 6.5–8.1)

## 2017-01-26 LAB — CBC WITH DIFFERENTIAL/PLATELET
BASOS ABS: 0 10*3/uL (ref 0.0–0.1)
BASOS PCT: 0 %
Eosinophils Absolute: 0.1 10*3/uL (ref 0.0–0.7)
Eosinophils Relative: 1 %
HEMATOCRIT: 44.1 % (ref 39.0–52.0)
HEMOGLOBIN: 14.9 g/dL (ref 13.0–17.0)
LYMPHS PCT: 37 %
Lymphs Abs: 2.3 10*3/uL (ref 0.7–4.0)
MCH: 28.9 pg (ref 26.0–34.0)
MCHC: 33.8 g/dL (ref 30.0–36.0)
MCV: 85.5 fL (ref 78.0–100.0)
MONO ABS: 0.7 10*3/uL (ref 0.1–1.0)
Monocytes Relative: 12 %
NEUTROS ABS: 3.1 10*3/uL (ref 1.7–7.7)
Neutrophils Relative %: 50 %
Platelets: 93 10*3/uL — ABNORMAL LOW (ref 150–400)
RBC: 5.16 MIL/uL (ref 4.22–5.81)
RDW: 14.2 % (ref 11.5–15.5)
WBC: 6.2 10*3/uL (ref 4.0–10.5)

## 2017-01-26 LAB — ETHANOL

## 2017-01-26 MED ORDER — OLANZAPINE 5 MG PO TBDP
5.0000 mg | ORAL_TABLET | Freq: Two times a day (BID) | ORAL | Status: DC
  Administered 2017-01-27: 5 mg via ORAL
  Filled 2017-01-26: qty 1

## 2017-01-26 MED ORDER — IBUPROFEN 200 MG PO TABS: 600.0000 mg | ORAL_TABLET | Freq: Three times a day (TID) | ORAL | Status: DC | PRN

## 2017-01-26 MED ORDER — ZIPRASIDONE MESYLATE 20 MG IM SOLR
20.0000 mg | Freq: Once | INTRAMUSCULAR | Status: AC
  Administered 2017-01-26: 20 mg via INTRAMUSCULAR
  Filled 2017-01-26: qty 20

## 2017-01-26 MED ORDER — STERILE WATER FOR INJECTION IJ SOLN
INTRAMUSCULAR | Status: AC
  Administered 2017-01-26: 1.2 mL
  Filled 2017-01-26: qty 10

## 2017-01-26 MED ORDER — LORAZEPAM 1 MG PO TABS: 1.0000 mg | ORAL_TABLET | Freq: Three times a day (TID) | ORAL | Status: DC | PRN

## 2017-01-26 MED ORDER — LORAZEPAM 2 MG/ML IJ SOLN
2.0000 mg | Freq: Once | INTRAMUSCULAR | Status: AC
  Administered 2017-01-26: 2 mg via INTRAMUSCULAR
  Filled 2017-01-26: qty 1

## 2017-01-26 MED ORDER — DIPHENHYDRAMINE HCL 50 MG/ML IJ SOLN
50.0000 mg | Freq: Once | INTRAMUSCULAR | Status: AC
  Administered 2017-01-26: 50 mg via INTRAMUSCULAR
  Filled 2017-01-26: qty 1

## 2017-01-26 NOTE — ED Notes (Signed)
telepsych done 

## 2017-01-26 NOTE — ED Notes (Signed)
Bed: [facility] Expected date:  Expected time:  Means of arrival:  Comments: 

## 2017-01-26 NOTE — ED Triage Notes (Signed)
Per patient, states he uses LSD-states he last used yesterday-mom states he showed up at her house talking out of his head-delusional, not making sense
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
## 2017-01-26 NOTE — BH Assessment (Signed)
Samuel Martin recommends inpatient treatment

## 2017-01-26 NOTE — ED Notes (Signed)
Awaiting TTS assessment at 8pm

## 2017-01-26 NOTE — ED Notes (Signed)
Pt transferred to department agitated, responding to audio, visual hallucinations.  Talking and laughing to self and at patients.  Awake, alert & responsive, no distress noted.  Resting at present.  Monitoring for safety, Q 15 min checks in effect.  Safety check for contraband completed, no items found.
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
## 2017-01-26 NOTE — ED Notes (Addendum)
Pt psychotic, laughing to self and patients, running down hallway.  PA Spencer notified.  Med orders given.
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
## 2017-01-26 NOTE — ED Notes (Signed)
Patient refused blood draw. RN made aware. 

## 2017-01-26 NOTE — ED Notes (Signed)
Bed: WA29 Expected date:  Expected time:  Means of arrival:  Comments: 

## 2017-01-27 ENCOUNTER — Inpatient Hospital Stay
Admission: EM | Admit: 2017-01-27 | Discharge: 2017-02-03 | DRG: 885 | Disposition: A | Discharge status: Home / Self Care | Payer: Federal, State, Local not specified - Other | Source: Intra-hospital | Attending: Psychiatry | Admitting: Psychiatry

## 2017-01-27 DIAGNOSIS — F1210 Cannabis abuse, uncomplicated: Secondary | ICD-10-CM | POA: Diagnosis present

## 2017-01-27 DIAGNOSIS — F17210 Nicotine dependence, cigarettes, uncomplicated: Secondary | ICD-10-CM

## 2017-01-27 DIAGNOSIS — F1290 Cannabis use, unspecified, uncomplicated: Secondary | ICD-10-CM | POA: Diagnosis not present

## 2017-01-27 DIAGNOSIS — Z9119 Patient's noncompliance with other medical treatment and regimen: Secondary | ICD-10-CM | POA: Diagnosis not present

## 2017-01-27 DIAGNOSIS — R443 Hallucinations, unspecified: Secondary | ICD-10-CM | POA: Diagnosis not present

## 2017-01-27 DIAGNOSIS — F23 Brief psychotic disorder: Secondary | ICD-10-CM

## 2017-01-27 DIAGNOSIS — F1220 Cannabis dependence, uncomplicated: Secondary | ICD-10-CM | POA: Diagnosis present

## 2017-01-27 DIAGNOSIS — F12951 Cannabis use, unspecified with psychotic disorder with hallucinations: Secondary | ICD-10-CM

## 2017-01-27 DIAGNOSIS — F312 Bipolar disorder, current episode manic severe with psychotic features: Secondary | ICD-10-CM | POA: Diagnosis present

## 2017-01-27 DIAGNOSIS — G4700 Insomnia, unspecified: Secondary | ICD-10-CM | POA: Diagnosis present

## 2017-01-27 DIAGNOSIS — Z789 Other specified health status: Secondary | ICD-10-CM | POA: Diagnosis present

## 2017-01-27 DIAGNOSIS — D696 Thrombocytopenia, unspecified: Secondary | ICD-10-CM | POA: Diagnosis present

## 2017-01-27 DIAGNOSIS — F17200 Nicotine dependence, unspecified, uncomplicated: Secondary | ICD-10-CM | POA: Diagnosis present

## 2017-01-27 MED ORDER — MAGNESIUM HYDROXIDE 400 MG/5ML PO SUSP
30.0000 mL | Freq: Every day | ORAL | Status: DC | PRN
  Administered 2017-02-01 – 2017-02-03 (×2): 30 mL via ORAL
  Filled 2017-01-27 (×2): qty 30

## 2017-01-27 MED ORDER — LORAZEPAM 2 MG PO TABS
2.0000 mg | ORAL_TABLET | ORAL | Status: DC
  Administered 2017-01-27 – 2017-01-28 (×3): 2 mg via ORAL
  Filled 2017-01-27 (×3): qty 1

## 2017-01-27 MED ORDER — HYDROXYZINE HCL 25 MG PO TABS
25.0000 mg | ORAL_TABLET | Freq: Three times a day (TID) | ORAL | Status: DC | PRN
  Administered 2017-01-28 – 2017-02-02 (×4): 25 mg via ORAL
  Filled 2017-01-27 (×4): qty 1

## 2017-01-27 MED ORDER — ALUM & MAG HYDROXIDE-SIMETH 200-200-20 MG/5ML PO SUSP: 30.0000 mL | ORAL | Status: DC | PRN

## 2017-01-27 MED ORDER — TRAZODONE HCL 50 MG PO TABS
50.0000 mg | ORAL_TABLET | Freq: Every evening | ORAL | Status: DC | PRN
  Administered 2017-01-27 – 2017-02-02 (×6): 50 mg via ORAL
  Filled 2017-01-27 (×6): qty 1

## 2017-01-27 MED ORDER — ACETAMINOPHEN 325 MG PO TABS: 650.0000 mg | ORAL_TABLET | Freq: Four times a day (QID) | ORAL | Status: DC | PRN

## 2017-01-27 MED ORDER — LORAZEPAM 2 MG/ML IJ SOLN: 2.0000 mg | INTRAMUSCULAR | Status: DC

## 2017-01-27 MED ORDER — IBUPROFEN 600 MG PO TABS: 600.0000 mg | ORAL_TABLET | Freq: Three times a day (TID) | ORAL | 0 refills | Status: DC | PRN

## 2017-01-27 NOTE — Progress Notes (Addendum)
01/27/17 1340:  LRT went to pt room to offer activities, pt was sleep.  Caroll Rancher, LRT/CTRS

## 2017-01-27 NOTE — [facility] Group Notes (Signed)
[facility] Group Notes:  (Nursing/MHT/Case Management/Adjunct)  Date:  01/27/2017  Time:  11:20 PM  Type of Therapy:  Psychoeducational Skills  Participation Level:  Did Not Attend   Foy Guadalajara 01/27/2017, 11:20 PM

## 2017-01-27 NOTE — ED Notes (Signed)
Report called to RN Kathlene November, [facility].  rm 303.  Pending [facility] transport.

## 2017-01-27 NOTE — ED Notes (Signed)
Pt is bizarre.  He appears to be responding to internal stimuli.  He is compliant with meds and calm and cooperative.  15 minute checks and video monitoring in place.

## 2017-01-27 NOTE — ED Notes (Signed)
Report attempted to [facility],  Staff to return call.

## 2017-01-27 NOTE — BH Assessment (Signed)
Patient has been accepted to [facility].  Accepting physician is Dr. Ardyth Harps.  Attending Physician will be Dr. Ardyth Harps.  Patient has been assigned to room 324, by [facility] Charge Nurse Brumley F.  Call report to [phone].  Representative/Transfer Coordinator is Warden/ranger Patient pre-admitted by [facility] Patient Access Rivka Barbara)   [facility] ER Staff Haig Prophet., NP & Jessie Foot, TTS) made aware of acceptance.

## 2017-01-27 NOTE — BH Assessment (Signed)
Patient accepted to [facility] by Dr. Ardyth Harps. Room [phone]. Room 303. Support paperwork completed. Transport pending.

## 2017-01-27 NOTE — ED Notes (Signed)
Pt awake, alert & responsive, no distress noted, cooperative and anxious at present.  Pending report to [facility] transport.  Monitoring for safety, Q 15 min checks in effect.

## 2017-01-27 NOTE — Progress Notes (Signed)
Per Thayer Ohm at [facility], pt can not be accepted due to level of acuity.    Dorothe Pea. Dearra Myhand, Theresia Majors, LCAS Clinical Social Worker Ph: [phone]
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
## 2017-01-27 NOTE — BH Assessment (Signed)
BHH Assessment Progress Note  Per Thedore Mins, MD, this pt requires psychiatric hospitalization at this time.  The following facilities have been contacted to seek placement for this pt, with results as noted:  Beds available, information sent, decision pending:  [facility] [place]   At capacity:  [facility]   Doylene Canning, [place] Triage Specialist [phone]

## 2017-01-27 NOTE — Tx Team (Signed)
Initial Treatment Plan 01/27/2017 10:47 PM Samuel Martin ZOX:[id number]    PATIENT STRESSORS: Financial difficulties Substance abuse   PATIENT STRENGTHS: Capable of independent living General fund of knowledge Motivation for treatment/growth Supportive family/friends   PATIENT IDENTIFIED PROBLEMS:  psychosis  SA (THC)   "getting out of here, going home"                 DISCHARGE CRITERIA:  Ability to meet basic life and health needs Improved stabilization in mood, thinking, and/or behavior Verbal commitment to aftercare and medication compliance  PRELIMINARY DISCHARGE PLAN: Attend aftercare/continuing care group Outpatient therapy  PATIENT/FAMILY INVOLVEMENT: This treatment plan has been presented to and reviewed with the patient, Samuel Martin.  The patient and family have been given the opportunity to ask questions and make suggestions.  Delos Haring, RN 01/27/2017, 10:47 PM

## 2017-01-28 ENCOUNTER — Encounter: Payer: Self-pay | Admitting: Psychiatry

## 2017-01-28 DIAGNOSIS — F17200 Nicotine dependence, unspecified, uncomplicated: Secondary | ICD-10-CM | POA: Diagnosis present

## 2017-01-28 DIAGNOSIS — F312 Bipolar disorder, current episode manic severe with psychotic features: Principal | ICD-10-CM

## 2017-01-28 LAB — TSH: TSH: 1.069 u[IU]/mL (ref 0.350–4.500)

## 2017-01-28 LAB — RAPID HIV SCREEN (HIV 1/2 AB+AG)
HIV 1/2 Antibodies: REACTIVE — AB
HIV-1 P24 Antigen - HIV24: NONREACTIVE

## 2017-01-28 LAB — LIPID PANEL
CHOL/HDL RATIO: 2.2 ratio
Cholesterol: 119 mg/dL (ref 0–200)
HDL: 54 mg/dL (ref >40)
LDL CALC: 53 mg/dL (ref 0–99)
TRIGLYCERIDES: 62 mg/dL (ref <150)
VLDL: 12 mg/dL (ref 0–40)

## 2017-01-28 LAB — VITAMIN B12: Vitamin B-12: 397 pg/mL (ref 180–914)

## 2017-01-28 MED ORDER — ARIPIPRAZOLE 10 MG PO TABS
10.0000 mg | ORAL_TABLET | Freq: Every day | ORAL | Status: DC
  Administered 2017-01-28 – 2017-01-30 (×3): 10 mg via ORAL
  Filled 2017-01-28 (×3): qty 1

## 2017-01-28 MED ORDER — CYANOCOBALAMIN 1000 MCG/ML IJ SOLN
1000.0000 ug | Freq: Every day | INTRAMUSCULAR | Status: DC
  Administered 2017-01-28: 1000 ug via INTRAMUSCULAR
  Filled 2017-01-28: qty 1

## 2017-01-28 MED ORDER — DIVALPROEX SODIUM 500 MG PO DR TAB
500.0000 mg | DELAYED_RELEASE_TABLET | Freq: Three times a day (TID) | ORAL | Status: DC
  Administered 2017-01-28 – 2017-01-31 (×9): 500 mg via ORAL
  Filled 2017-01-28 (×10): qty 1

## 2017-01-28 MED ORDER — OLANZAPINE 5 MG PO TBDP: 5.0000 mg | ORAL_TABLET | Freq: Three times a day (TID) | ORAL | Status: DC | PRN

## 2017-01-28 MED ORDER — OLANZAPINE 5 MG PO TBDP: 10.0000 mg | ORAL_TABLET | Freq: Every day | ORAL | Status: DC

## 2017-01-28 MED ORDER — VITAMIN B-12 1000 MCG PO TABS
1000.0000 ug | ORAL_TABLET | Freq: Every day | ORAL | Status: DC
  Administered 2017-01-29 – 2017-02-03 (×6): 1000 ug via ORAL
  Filled 2017-01-28 (×6): qty 1

## 2017-01-28 NOTE — BHH Group Notes (Signed)
[facility] LCSW Group Therapy 01/28/2017 1:15 PM  Type of Therapy: Group Therapy- Emotion Regulation  Participation Level: Pt invited. Did not attend.  Jonathon Jordan, MSW, LCSWA 01/28/2017 2:15 PM
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
## 2017-01-28 NOTE — Progress Notes (Signed)
ID brief note Notified by vigilanz system of + HIV test.  I will see patient this afternoon. Please call with questions. [phone]
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
## 2017-01-28 NOTE — [facility] Suicide Risk Assessment (Signed)
[facility] INPATIENT:  Family/Significant Other Suicide Prevention Education  Suicide Prevention Education:  Education Completed; mother, Rhylen Shaheen ph#: [phone] has been identified by the patient as the family member/significant other with whom the patient will be residing, and identified as the person(s) who will aid the patient in the event of a mental health crisis (suicidal ideations/suicide attempt).  With written consent from the patient, the family member/significant other has been provided the following suicide prevention education, prior to the and/or following the discharge of the patient.  The suicide prevention education provided includes the following:  Suicide risk factors  Suicide prevention and interventions  National Suicide Hotline telephone number  [facility] assessment telephone number  [facility] Emergency Assistance 911  [facility] and/or Residential Mobile Crisis Unit telephone number  Request made of family/significant other to:  Remove weapons (e.g., guns, rifles, knives), all items previously/currently identified as safety concern.    Remove drugs/medications (over-the-counter, prescriptions, illicit drugs), all items previously/currently identified as a safety concern.  The family member/significant other verbalizes understanding of the suicide prevention education information provided.  The family member/significant other agrees to remove the items of safety concern listed above.  Lynden Oxford, MSW, LCSW-A 01/28/2017, 10:23 AM

## 2017-01-28 NOTE — Progress Notes (Signed)
Recreation Therapy Notes  Date: 04.18.18 Time: 9:30 am Location: Craft Room  Group Topic: Self-esteem  Goal Area(s) Addresses:  Patient will write at least one positive trait about self. Patient will verbalize benefit of having healthy self-esteem.  Behavioral Response: Did not attend  Intervention: I Am  Activity: Patients were given a worksheet with the letter I on it and were instructed to write as many positive trait about themselves inside the letter.  Education: LRT educated patients on ways they can increase their self-esteem.  Education Outcome: Patient did not attend group.  Clinical Observations/Feedback: Patient did not attend group.  Jacquelynn Cree, LRT/CTRS 01/28/2017 10:17 AM

## 2017-01-29 LAB — HELPER T-LYMPH-CD4 (ARMC ONLY)
% CD 4 Pos. Lymph.: 39.1 % (ref 30.8–58.5)
Absolute CD 4 Helper: 704 /uL (ref 359–1519)
BASOS: 1 %
Basophils Absolute: 0 10*3/uL (ref 0.0–0.2)
EOS (ABSOLUTE): 0.1 10*3/uL (ref 0.0–0.4)
EOS: 2 %
HEMATOCRIT: 44.4 % (ref 37.5–51.0)
HEMOGLOBIN: 14.7 g/dL (ref 13.0–17.7)
IMMATURE GRANULOCYTES: 0 %
Immature Grans (Abs): 0 10*3/uL (ref 0.0–0.1)
LYMPHS ABS: 1.8 10*3/uL (ref 0.7–3.1)
Lymphs: 27 %
MCH: 29 pg (ref 26.6–33.0)
MCHC: 33.1 g/dL (ref 31.5–35.7)
MCV: 88 fL (ref 79–97)
MONOS ABS: 0.4 10*3/uL (ref 0.1–0.9)
Monocytes: 7 %
NEUTROS PCT: 63 %
Neutrophils Absolute: 4.1 10*3/uL (ref 1.4–7.0)
PLATELETS: 94 10*3/uL — AB (ref 150–379)
RBC: 5.07 x10E6/uL (ref 4.14–5.80)
RDW: 15.4 % (ref 12.3–15.4)
WBC: 6.4 10*3/uL (ref 3.4–10.8)

## 2017-01-29 LAB — HEMOGLOBIN A1C
HEMOGLOBIN A1C: 5.2 % (ref 4.8–5.6)
MEAN PLASMA GLUCOSE: 103 mg/dL

## 2017-01-29 LAB — HEPATITIS B VIRUS (PROFILE VI)
HEP B C IGM: NEGATIVE
HEP B C TOTAL AB: NEGATIVE
HEP B E AB: NEGATIVE
HEP B E AG: NEGATIVE
HEP B S AB: NONREACTIVE
HEP B S AG: NEGATIVE

## 2017-01-29 LAB — HEPATITIS C ANTIBODY: HCV Ab: <0.1 s/co ratio (ref 0.0–0.9)

## 2017-01-29 LAB — RPR: RPR: NONREACTIVE

## 2017-01-29 LAB — HIV-1 RNA QUANT-NO REFLEX-BLD
HIV 1 RNA Quant: <20 copies/mL
LOG10 HIV-1 RNA: UNDETERMINED log10copy/mL

## 2017-01-29 NOTE — Progress Notes (Signed)
ID brief note I had met yesterday with patient to discuss his positive test. Fu PCR is negative This is a false negative test.  I have spoken with RN on the unit and asked her to discuss the negative follow up test with the patient.

## 2017-01-29 NOTE — Progress Notes (Signed)
Recreation Therapy Notes  INPATIENT RECREATION THERAPY ASSESSMENT  Patient Details Name: Samuel Martin MRN: [id number] DOB: 10-Mar-1996 Today's Date: 01/29/2017  Patient Stressors: Relationship, Other (Comment) (Not having a girlfriend; being in the hospital)  Coping Skills:   Isolate, Substance Abuse, Exercise, Art/Dance, Music, Sports, Other (Comment) (Sleep)  Personal Challenges: Expressing Yourself, Relationships, Social Interaction, Time Management  Leisure Interests (2+):  Games - Video games, Individual - Other (Comment) (Working out)  Biochemist, clinical Resources:  Yes  Community Resources:  Other (Comment) (A place similar to the [facility] but is not the [facility])  Current Use: No  If no, Barriers?: Other (Comment) (Time)  Patient Strengths:  "Being me and meeting you"  Patient Identified Areas of Improvement:  Manage time  Current Recreation Participation:  Smoke weed and listen to music  Patient Goal for Hospitalization:  To get out  [place] of Residence:  [place] of Residence:  [place]   Current [place] (including self-harm):  No  Current HI:  No  Consent to Intern Participation: N/A   Jacquelynn Cree, LRT/CTRS 01/29/2017, 5:04 PM

## 2017-01-29 NOTE — Progress Notes (Signed)
Recreation Therapy Notes  Date: 04.19.18 Time: 9:30 am Location: Craft Room  Group Topic: Leisure Education  Goal Area(s) Addresses:  Patient will identify activity for each letter of the alphabet. Patient will verbalize ability to integrate positive leisure into life post d/c. Patient will verbalize ability to use leisure as a Associate Professor.  Behavioral Response: Attentive  Intervention: Leisure Alphabet  Activity: Patients were given a Leisure Information systems manager and were instructed to write healthy leisure activities for each letter of the alphabet.  Education: LRT educated patients on what they need to participate in leisure.  Education Outcome: In group clarification offered  Clinical Observations/Feedback: Patient wrote healthy leisure activities. Patient did not contribute to group discussion.  Jacquelynn Cree, LRT/CTRS 01/29/2017 10:19 AM
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
## 2017-01-29 NOTE — [facility] Group Notes (Signed)
[facility] LCSW Group Therapy   01/29/2017 1pm   Type of Therapy: Group Therapy   Participation Level: Active   Participation Quality: Attentive, Sharing and Supportive   Affect: Appropriate   Cognitive: Alert and Oriented   Insight: Developing/Improving and Engaged   Engagement in Therapy: Developing/Improving and Engaged   Modes of Intervention: Clarification, Confrontation, Discussion, Education, Exploration, Limit-setting, Orientation, Problem-solving, Rapport Building, Dance movement psychotherapist, Socialization and Support   Summary of Progress/Problems: The topic for group was balance in life. Today's group focused on defining balance in one's own words, identifying things that can knock one off balance, and exploring healthy ways to maintain balance in life. Group members were asked to provide an example of a time when they felt off balance, describe how they handled that situation, and process healthier ways to regain balance in the future. Group members were asked to share the most important tool for maintaining balance that they learned while at [facility] and how they plan to apply this method after discharge. Patient defined balance as a  "scale." He identified finances as an out of balance area. CSW provided support to patient.   Hampton Abbot, MSW, LCSWA 01/29/2017, 2:09PM

## 2017-01-29 NOTE — Plan of Care (Signed)
Problem: Coping: Goal: Ability to demonstrate self-control will improve Outcome: Progressing Patient has demonstrated calm behavior this HS and had good eye contact during interaction.

## 2017-01-29 NOTE — Plan of Care (Signed)
Problem: Coping: Goal: Ability to verbalize feelings will improve Outcome: Progressing Patient verbalized feelings to staff.    

## 2017-01-29 NOTE — Progress Notes (Signed)
Per phone call from Dr Clydie Braun to writer, Patient is negative for HIV based on labs done today.  Writer relayed this information to [name] at Dr Jarrett Ables request.  Patient was elated and tearful.  He verbalized appreciation to staff.

## 2017-01-29 NOTE — Progress Notes (Signed)
Calm and compliant on unit, does not forward much. Affect is flat. Denies SI.HI.VH. Encouraged to verbalize needs and participate in unit activities. Overall appropriate with peers. Will continue to monitor.

## 2017-01-30 DIAGNOSIS — Z789 Other specified health status: Secondary | ICD-10-CM | POA: Diagnosis present

## 2017-01-30 LAB — CRYPTOCOCCUS ANTIGEN, SERUM: Cryptococcus Antigen, Serum: NEGATIVE

## 2017-01-30 MED ORDER — ARIPIPRAZOLE 10 MG PO TABS
20.0000 mg | ORAL_TABLET | Freq: Every day | ORAL | Status: DC
  Administered 2017-01-31 – 2017-02-03 (×4): 20 mg via ORAL
  Filled 2017-01-30 (×4): qty 2

## 2017-01-30 MED ORDER — ARIPIPRAZOLE 10 MG PO TABS
10.0000 mg | ORAL_TABLET | Freq: Once | ORAL | Status: AC
  Administered 2017-01-30: 10 mg via ORAL
  Filled 2017-01-30: qty 1

## 2017-01-30 MED ORDER — ARIPIPRAZOLE ER 400 MG IM SRER
400.0000 mg | INTRAMUSCULAR | Status: DC
  Administered 2017-01-30: 400 mg via INTRAMUSCULAR
  Filled 2017-01-30: qty 400

## 2017-01-30 NOTE — Progress Notes (Signed)
Samuel Martin was given information that his HIV test was negative, per Dr. [name] via phone call to writer.  Samuel Martin was immediately elated and appreciative.  He demonstrated understanding the information. Behavior was calm and pleasant this evening.

## 2017-01-30 NOTE — Progress Notes (Signed)
Patient is interacting well with others, pleasant, no behavioral issues, Patient with q 15 minute checks. Patient denies si/hi or avh.
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
## 2017-01-30 NOTE — [facility] Group Notes (Signed)
[facility] LCSW Group Therapy Note  Date/Time: 01/30/17, 1300  Type of Therapy and Topic:  Group Therapy:  Feelings around Relapse and Recovery  Participation Level:  Minimal   Mood: pleasant  Description of Group:    Patients in this group will discuss emotions they experience before and after a relapse. They will process how experiencing these feelings, or avoidance of experiencing them, relates to having a relapse. Facilitator will guide patients to explore emotions they have related to recovery. Patients will be encouraged to process which emotions are more powerful. They will be guided to discuss the emotional reaction significant others in their lives may have to patients' relapse or recovery. Patients will be assisted in exploring ways to respond to the emotions of others without this contributing to a relapse.  Therapeutic Goals: 1. Patient will identify two or more emotions that lead to relapse for them:  2. Patient will identify two emotions that result when they relapse:  3. Patient will identify two emotions related to recovery:  4. Patient will demonstrate ability to communicate their needs through discussion and/or role plays.   Summary of Patient Progress: Pt was attentive in group but said very little.  He did identify "anime music videos" as his coping skill for difficult feelings.  He did not participate in the group discussion.     Therapeutic Modalities:   Cognitive Behavioral Therapy Solution-Focused Therapy Assertiveness Training Relapse Prevention Therapy  Daleen Squibb, LCSW

## 2017-01-30 NOTE — [facility] Group Notes (Signed)
[facility] Group Notes:  (Nursing/MHT/Case Management/Adjunct)  Date:  01/30/2017  Time:  5:50 AM  Type of Therapy:  Psychoeducational Skills  Participation Level:  Active  Participation Quality:  Appropriate  Affect:  Appropriate  Cognitive:  Appropriate  Insight:  Appropriate and Good  Engagement in Group:  Engaged  Modes of Intervention:  Discussion, Socialization and Support  Summary of Progress/Problems:  Samuel Martin 01/30/2017, 5:50 AM

## 2017-01-30 NOTE — Tx Team (Addendum)
Interdisciplinary Treatment and Diagnostic Plan Update  01/30/2017 Time of Session: 10:30 AM Trei Schoch MRN: [id number]  Principal Diagnosis: Bipolar I disorder, most recent episode manic, severe with psychotic features (HCC)  Secondary Diagnoses: Principal Problem:   Bipolar I disorder, most recent episode manic, severe with psychotic features (HCC) Active Problems:   Cannabis use disorder, severe, dependence (HCC)   Thrombocytopenia (HCC)   Tobacco use disorder   False positive HIV serology   Current Medications:  Current Facility-Administered Medications  Medication Dose Route Frequency Provider Last Rate Last Dose  . acetaminophen (TYLENOL) tablet 650 mg  650 mg Oral Q6H PRN Jimmy Footman, MD      . alum & mag hydroxide-simeth (MAALOX/MYLANTA) 200-200-20 MG/5ML suspension 30 mL  30 mL Oral Q4H PRN Jimmy Footman, MD      . ARIPiprazole (ABILIFY) tablet 10 mg  10 mg Oral Daily Shari Prows, MD   10 mg at 01/30/17 8841  . divalproex (DEPAKOTE) DR tablet 500 mg  500 mg Oral Q8H Jolanta B Pucilowska, MD   500 mg at 01/30/17 0643  . hydrOXYzine (ATARAX/VISTARIL) tablet 25 mg  25 mg Oral TID PRN Jimmy Footman, MD   25 mg at 01/28/17 2309  . magnesium hydroxide (MILK OF MAGNESIA) suspension 30 mL  30 mL Oral Daily PRN Jimmy Footman, MD      . traZODone (DESYREL) tablet 50 mg  50 mg Oral QHS PRN Jimmy Footman, MD   50 mg at 01/28/17 2309  . vitamin B-12 (CYANOCOBALAMIN) tablet 1,000 mcg  1,000 mcg Oral Daily Shari Prows, MD   1,000 mcg at 01/30/17 6606   PTA Medications: Prescriptions Prior to Admission  Medication Sig Dispense Refill Last Dose  . ibuprofen (ADVIL,MOTRIN) 600 MG tablet Take 1 tablet (600 mg total) by mouth every 8 (eight) hours as needed (Temp > 38.3Celsius). 30 tablet 0 Past Month at Unknown time  . OLANZapine zydis (ZYPREXA) 5 MG disintegrating tablet Take 1 tablet (5 mg total) by mouth 2  (two) times daily. (Patient not taking: Reported on 08/06/2016) 60 tablet 0 Not Taking at Unknown time    Patient Stressors: Financial difficulties Substance abuse  Patient Strengths: Capable of independent living [employer] of knowledge Motivation for treatment/growth Supportive family/friends  Treatment Modalities: Medication Management, Group therapy, Case management,  1 to 1 session with clinician, Psychoeducation, Recreational therapy.   Physician Treatment Plan for Primary Diagnosis: Bipolar I disorder, most recent episode manic, severe with psychotic features (HCC) Long Term Goal(s): Improvement in symptoms so as ready for discharge Improvement in symptoms so as ready for discharge   Short Term Goals: Ability to identify changes in lifestyle to reduce recurrence of condition will improve Ability to verbalize feelings will improve Ability to disclose and discuss suicidal ideas Ability to demonstrate self-control will improve Ability to identify and develop effective coping behaviors will improve Compliance with prescribed medications will improve Ability to identify triggers associated with substance abuse/mental health issues will improve Ability to identify changes in lifestyle to reduce recurrence of condition will improve Ability to demonstrate self-control will improve Ability to identify triggers associated with substance abuse/mental health issues will improve  Medication Management: Evaluate patient's response, side effects, and tolerance of medication regimen.  Therapeutic Interventions: 1 to 1 sessions, Unit Group sessions and Medication administration.  Evaluation of Outcomes: Progressing  Physician Treatment Plan for Secondary Diagnosis: Principal Problem:   Bipolar I disorder, most recent episode manic, severe with psychotic features (HCC) Active Problems:   Cannabis  use disorder, severe, dependence (HCC)   Thrombocytopenia (HCC)   Tobacco use disorder    False positive HIV serology  Long Term Goal(s): Improvement in symptoms so as ready for discharge Improvement in symptoms so as ready for discharge   Short Term Goals: Ability to identify changes in lifestyle to reduce recurrence of condition will improve Ability to verbalize feelings will improve Ability to disclose and discuss suicidal ideas Ability to demonstrate self-control will improve Ability to identify and develop effective coping behaviors will improve Compliance with prescribed medications will improve Ability to identify triggers associated with substance abuse/mental health issues will improve Ability to identify changes in lifestyle to reduce recurrence of condition will improve Ability to demonstrate self-control will improve Ability to identify triggers associated with substance abuse/mental health issues will improve     Medication Management: Evaluate patient's response, side effects, and tolerance of medication regimen.  Therapeutic Interventions: 1 to 1 sessions, Unit Group sessions and Medication administration.  Evaluation of Outcomes: Progressing   RN Treatment Plan for Primary Diagnosis: Bipolar I disorder, most recent episode manic, severe with psychotic features (HCC) Long Term Goal(s): Knowledge of disease and therapeutic regimen to maintain health will improve  Short Term Goals: Ability to remain free from injury will improve and Compliance with prescribed medications will improve  Medication Management: RN will administer medications as ordered by provider, will assess and evaluate patient's response and provide education to patient for prescribed medication. RN will report any adverse and/or side effects to prescribing provider.  Therapeutic Interventions: 1 on 1 counseling sessions, Psychoeducation, Medication administration, Evaluate responses to treatment, Monitor vital signs and CBGs as ordered, Perform/monitor CIWA, COWS, AIMS and Fall Risk screenings  as ordered, Perform wound care treatments as ordered.  Evaluation of Outcomes: Progressing   LCSW Treatment Plan for Primary Diagnosis: Bipolar I disorder, most recent episode manic, severe with psychotic features (HCC) Long Term Goal(s): Safe transition to appropriate next level of care at discharge, Engage patient in therapeutic group addressing interpersonal concerns.  Short Term Goals: Engage patient in aftercare planning with referrals and resources and Identify triggers associated with mental health/substance abuse issues  Therapeutic Interventions: Assess for all discharge needs, 1 to 1 time with Social worker, Explore available resources and support systems, Assess for adequacy in community support network, Educate family and significant other(s) on suicide prevention, Complete Psychosocial Assessment, Interpersonal group therapy.  Evaluation of Outcomes: Progressing    Recreational Therapy Treatment Plan for Primary Diagnosis: Bipolar I disorder, most recent episode manic, severe with psychotic features (HCC) Long Term Goal(s): Patient will participate in recreation therapy treatment in at least 2 group sessions without prompting from LRT  Short Term Goals: Increase self-expression skills, Increase time management skills  Treatment Modalities: Group Therapy and Individual Treatment Sessions  Therapeutic Interventions: Psychoeducation  Evaluation of Outcomes: Progressing   Progress in Treatment: Attending groups: Yes. Participating in groups: Yes. Taking medication as prescribed: Yes. Toleration medication: Yes. Family/Significant other contact made: Yes, individual(s) contacted:  CSW contacted pt's mother. Patient understands diagnosis: Yes. Discussing patient identified problems/goals with staff: Yes. Medical problems stabilized or resolved: Yes. Denies suicidal/homicidal ideation: Yes. Issues/concerns per patient self-inventory: No.  New problem(s) identified: No,  Describe:  None identified.  New Short Term/Long Term Goal(s): Patient's goal is to work on discharge plans.  Discharge Plan or Barriers: CSW assessing proper aftercare plans.   Reason for Continuation of Hospitalization: Mania Other; describe Psychotic episode  Estimated Length of Stay: 3 days   Attendees: Patient:  Samuel Martin 01/30/2017 10:52 AM  Physician: Dr. Kristine Linea, MD  01/30/2017 10:52 AM  Nursing: Shelia Media, RN  01/30/2017 10:52 AM  RN Care Manager: 01/30/2017 10:52 AM  Social Worker: Hampton Abbot, MSW, LCSW-A 01/30/2017 10:52 AM  Recreational Therapist: Princella Ion, LRT/CTRS  01/30/2017 10:52 AM  Other:  01/30/2017 10:52 AM  Other:  01/30/2017 10:52 AM  Other: 01/30/2017 10:52 AM    Scribe for Treatment Team: Lynden Oxford, LCSWA 01/30/2017 10:52 AM

## 2017-01-30 NOTE — Progress Notes (Signed)
Recreation Therapy Notes  Date: 04.20.18 Time: 9:30 am Location: Craft Room  Group Topic: Coping Skills  Goal Area(s) Addresses:  Patient will participate in healthy coping skill. Patient will verbalize benefit of using art as a coping skill.  Behavioral Response: Attentive   Intervention: Coloring  Activity: Patients were given coloring sheets to color and were instructed to think about the emotions they were feeling and what their minds were focused on.  Education: LRT educated patients on healthy coping skills.  Education Outcome: In group clarification offered   Clinical Observations/Feedback: Patient arrived to group at approximately 10:00 am. LRT explained activity. Patient stated he did not want to color but he wanted to look at the pictures for tattoos ideas. Patient started coloring after a few minutes. Patient did not contribute to group discussion.  Jacquelynn Cree, LRT/CTRS 01/30/2017 10:24 AM

## 2017-01-30 NOTE — Progress Notes (Signed)
Patient went outside, played basketball, no signs of distress, no behavioral issues, Patient with q 15 minute checks.

## 2017-01-31 LAB — AMMONIA: Ammonia: 33 umol/L (ref 9–35)

## 2017-01-31 LAB — VALPROIC ACID LEVEL: Valproic Acid Lvl: 131 ug/mL — ABNORMAL HIGH (ref 50.0–100.0)

## 2017-01-31 MED ORDER — DIVALPROEX SODIUM 500 MG PO DR TAB
500.0000 mg | DELAYED_RELEASE_TABLET | Freq: Two times a day (BID) | ORAL | Status: DC
  Administered 2017-01-31 – 2017-02-03 (×6): 500 mg via ORAL
  Filled 2017-01-31 (×6): qty 1

## 2017-01-31 NOTE — Plan of Care (Signed)
Problem: Activity: Goal: Sleeping patterns will improve Outcome: Progressing Had trouble falling asleep, requested PRN sleep medications. Fell asleep within an hour and slept reported 6 hours 15 minutes.

## 2017-01-31 NOTE — [facility] Group Notes (Signed)
[facility] Group Notes:  (Nursing/MHT/Case Management/Adjunct)  Date:  01/31/2017  Time:  12:32 AM  Type of Therapy:  Grief Loss  Participation Level:  Active  Participation Quality:  Appropriate  Affect:  Appropriate  Cognitive:  Appropriate  Insight:  Good  Engagement in Group:  Engaged  Modes of Intervention:  Activity  Summary of Progress/Problems:  Samuel Martin 01/31/2017, 12:32 AM

## 2017-01-31 NOTE — Progress Notes (Signed)
D: Pt denies SI/HI/AVH. Pt is pleasant and cooperative. Pt appears to be responding at times even though he denies AVH ,  due to his inappropriate smiling during our conversations.   A: Pt was offered support and encouragement. Pt was given scheduled medications. Pt was encourage to attend groups. Q 15 minute checks were done for safety.   R:Pt attends groups and interacts well with peers and staff. Pt is taking medication. Pt has no complaints.Pt receptive to treatment and safety maintained on unit.

## 2017-01-31 NOTE — Progress Notes (Signed)
Pt played cards in dayroom with peers until time for bed. Appropriately interacts with staff/peers. Voices relief regarding HIV testing results negative. Pleasant and cooperative although continues to appear guarded, forwards little, flat. Denies SI/HI/AVH, pain. Attends group. Medication compliant. Woke around midnight for PRN sleep medications, did fall about an hour later. Support and encouragment provided. Safety maintained with every 15 minute checks. Will continue to monitor.

## 2017-01-31 NOTE — [facility] Group Notes (Signed)
[facility] LCSW Group Therapy  01/31/2017 2:23 PM  Type of Therapy:  Group Therapy  Participation Level:  Minimal  Participation Quality:  Attentive  Affect:  Flat  Cognitive:  Alert  Insight:  None  Engagement in Therapy:  Poor  Modes of Intervention:  Activity, Discussion, Education, Problem-solving, Reality Testing and Support  Summary of Progress/Problems: Safety Planning: Patients identified fears or worries surrounding discharge. Patients offered support to their peers and openly developed safety plans for their individual needs. Patients developed their own safety plan. Patients discussed their warning signs, coping strategies, support system with family and friends, identified mental health professionals, and how to keep their environments safe (ex. Removing unnecessary medications or removing weapons/guns). Patients then discussed their personalized safety plan with the group.   Samuel Martin G. Garnette Czech MSW, LCSWA 01/31/2017, 2:27 PM

## 2017-01-31 NOTE — Plan of Care (Signed)
Problem: Safety: Goal: Periods of time without injury will increase Outcome: Progressing Pt safe on the unit at this time   

## 2017-01-31 NOTE — Plan of Care (Signed)
Problem: Coping: Goal: Ability to cope will improve Outcome: Progressing Voices ability to cope, relief regarding result of negative HIV testing results. Stated, "I had accepted it, but I'm glad it was wrong."
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
## 2017-02-01 LAB — RNA QUALITATIVE

## 2017-02-01 LAB — HIV-1/2 AB - DIFFERENTIATION
HIV 1 Ab: NEGATIVE
HIV 2 Ab: NEGATIVE
Note: NEGATIVE

## 2017-02-01 NOTE — [facility] Group Notes (Signed)
[facility] LCSW Group Therapy  02/01/2017 5:44 PM  Type of Therapy:  Group Therapy  Participation Level:  Minimal  Participation Quality:  Attentive  Affect:  Flat  Cognitive:  Alert  Insight:  None  Engagement in Therapy:  Limited  Modes of Intervention:  Discussion, Education, Problem-solving, Reality Testing, Socialization and Support  Summary of Progress/Problems: Recognizing Triggers: Patients defined triggers and discussed the importance of recognizing their personal warning signs. Patients identified their own triggers and how they tend to cope with stressful situations. Patients discussed areas such as people, places, things, and thoughts that rigger certain emotions for them. CSW provided support to patients and discussed safety planning for when these triggers occur. Group participants had opportunities to share openly with the group and participate in a group discussion while providing support and feedback to their peers.  Rene Gonsoulin G. Garnette Czech MSW, LCSWA 02/01/2017, 5:44 PM

## 2017-02-01 NOTE — Progress Notes (Signed)
Denies SI/HI/AVH.  Pleasant and cooperative.  Visible in the milieu.  Interacting appropriately.  Playing basketball during outside time.  Scheduled medications given.  Support offered.  Safety checks maintained.
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
## 2017-02-01 NOTE — Progress Notes (Signed)
D: Pt denies SI/HI/AVH. Pt is pleasant and cooperative. Pt pt appeared more logical with his speech. Pt stated he was doing fine.   A: Pt was offered support and encouragement. Pt was given scheduled medications. Pt was encourage to attend groups. Q 15 minute checks were done for safety.   R:Pt attends groups and interacts well with peers and staff. Pt is taking medication. Pt has no complaints.Pt receptive to treatment and safety maintained on unit.

## 2017-02-02 LAB — CBC WITH DIFFERENTIAL/PLATELET
Basophils Absolute: 0 10*3/uL (ref 0–0.1)
Basophils Relative: 1 %
EOS ABS: 0.1 10*3/uL (ref 0–0.7)
Eosinophils Relative: 2 %
HCT: 45.7 % (ref 40.0–52.0)
Hemoglobin: 14.8 g/dL (ref 13.0–18.0)
LYMPHS ABS: 1.8 10*3/uL (ref 1.0–3.6)
LYMPHS PCT: 41 %
MCH: 28.7 pg (ref 26.0–34.0)
MCHC: 32.3 g/dL (ref 32.0–36.0)
MCV: 89 fL (ref 80.0–100.0)
MONOS PCT: 11 %
Monocytes Absolute: 0.5 10*3/uL (ref 0.2–1.0)
NEUTROS PCT: 45 %
Neutro Abs: 2 10*3/uL (ref 1.4–6.5)
Platelets: 92 10*3/uL — ABNORMAL LOW (ref 150–440)
RBC: 5.14 MIL/uL (ref 4.40–5.90)
RDW: 14.8 % — ABNORMAL HIGH (ref 11.5–14.5)
WBC: 4.3 10*3/uL (ref 3.8–10.6)
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
## 2017-02-02 NOTE — Progress Notes (Signed)
Recreation Therapy Notes  Date: 04.23.18 Time: 9:30 am Location: Craft Room  Group Topic: Self-expression  Goal Area(s) Addresses:  Patient will be able to identify a color that represents each emotion. Patient will verbalize benefit of using art as a means of self-expression. Patient will verbalize one emotion experienced while participating in activity.  Behavioral Response: Attentive, Interactive  Intervention: The Colors Within Me  Activity: Patients were given a blank face worksheet and were instructed to pick a color for each emotion and show on the worksheet how much of the emotion they were feeling.  Education: LRT educated patients on other forms of self-expression.  Education Outcome: Acknowledges education/In group clarification offered  Clinical Observations/Feedback: Patient worked on Film/video editor. Patient contributed to group discussion by stating one of the emotions he was feeling.  Jacquelynn Cree, LRT/CTRS 02/02/2017 10:17 AM

## 2017-02-02 NOTE — Progress Notes (Signed)
D: Pt denies SI/HI/AVH. Pt is pleasant and cooperative. Pt seen interacting with peers in the dayroom appropriately.   A: Pt was offered support and encouragement. Pt was given scheduled medications. Pt was encourage to attend groups. Q 15 minute checks were done for safety.   R:Pt attends groups and interacts well with peers and staff. Pt is taking medication. Pt has no complaints at this time .Pt receptive to treatment and safety maintained on unit.

## 2017-02-02 NOTE — [facility] Group Notes (Signed)
[facility] Group Notes:  (Nursing/MHT/Case Management/Adjunct)  Date:  02/02/2017  Time:  4:19 PM  Type of Therapy:  Psychoeducational Skills  Participation Level:  None  Participation Quality:  Attentive  Affect:  Flat  Cognitive:  Appropriate  Insight:  Appropriate  Engagement in Group:  None  Modes of Intervention:  Discussion and Education  Summary of Progress/Problems:  Samuel Martin 02/02/2017, 4:19 PM

## 2017-02-02 NOTE — Plan of Care (Signed)
Problem: [facility] Participation in Recreation Therapeutic Interventions Goal: STG-Other Recreation Therapy Goal (Specify) STG: Self-Expression - Within 4 treatment sessions, patient will verbalize understanding of different ways he can express his emotions in each of 2 treatment sessions to increase healthy self-expression.  Outcome: Progressing Treatment Session 1; Completed 1 out of 2: At approximately 2:55 pm, LRT met with patient in craft room. LRT educated and provided patient with a list on ways he can express his emotions. Patient verbalized understanding. LRT encouraged patient to find healthy ways to express how he was feeling.  Leonette [facility], LRT/CTRS 04.23.18 3:16 pm  Problem: [facility] Participation in Recreation Therapeutic Interventions Goal: STG-Other Recreation Therapy Goal (Specify) STG: Time Management - Within 4 treatment sessions, patient will verbalize understanding of scheduling in each of 2 treatment sessions to increase time management skills.  Outcome: Progressing Treatment Session 1; Completed 1 out of 2: At approximately 2:55 pm, LRT met with patient in craft room. LRT educated and provided patient with blank schedules. Patient verbalized understanding of scheduling. LRT encouraged patient to use the schedules to help him manage his time better.  Leonette [facility], LRT/CTRS 04.23.18 3:19 pm

## 2017-02-02 NOTE — [facility] Group Notes (Signed)
[facility] LCSW Group Therapy  02/02/2017 2:30 PM  Type of Therapy:  Group Therapy  Participation Level:  Active  Participation Quality:  Attentive  Affect:  Not Congruent  Cognitive:  Disorganized  Insight:  Distracting  Engagement in Therapy:  Distracting  Modes of Intervention:  Discussion, Education, Exploration, Problem-solving, Socialization and Support  Summary of Progress/Problems: Today's Topic: Overcoming Obstacles. Patients identified one short term goal and potential obstacles in reaching this goal. Patients processed barriers involved in overcoming these obstacles. Patients identified steps necessary for overcoming these obstacles and explored motivation (internal and external) for facing these difficulties head on. Samuel Martin was attentive during group but had a difficlut time communicating his thoughts. Samuel Martin shared that he did not have a goal or obstacles. He explained that his mother brought him to the hospital because she was worried about "my bipolar mood." Samuel Martin then stated that he is usually calm but has scars of his own that he is trying to deal with. Samuel Martin tended to present with tangential speech and needed some redirection. He had difficulty remaining on topic and laughed at times throughout session when others were speaking.   Chauncey Bruno N Smart LCSW 02/02/2017, 2:30 PM

## 2017-02-02 NOTE — Progress Notes (Signed)
Patient is pleasant and cooperative.Denies suicidal or homicidal ideations and AV hallucinations.Patient continue to have in appropriate smiling during conversation.Compliant with medications.Attended groups.Appetite & energy level good.Support & encouragement given.

## 2017-02-02 NOTE — Plan of Care (Signed)
Problem: Safety: Goal: Ability to remain free from injury will improve Outcome: Progressing Pt safe on the unit at this time   

## 2017-02-03 LAB — VALPROIC ACID LEVEL: VALPROIC ACID LVL: 83 ug/mL (ref 50.0–100.0)

## 2017-02-03 MED ORDER — ARIPIPRAZOLE ER 400 MG IM SRER: 400.0000 mg | INTRAMUSCULAR | 1 refills | Status: DC

## 2017-02-03 MED ORDER — DIVALPROEX SODIUM 500 MG PO DR TAB: 500.0000 mg | DELAYED_RELEASE_TABLET | Freq: Two times a day (BID) | ORAL | 1 refills | Status: DC

## 2017-02-03 MED ORDER — ARIPIPRAZOLE 20 MG PO TABS: 20.0000 mg | ORAL_TABLET | Freq: Every day | ORAL | 0 refills | Status: DC

## 2017-02-03 MED ORDER — CYANOCOBALAMIN 1000 MCG PO TABS: 1000.0000 ug | ORAL_TABLET | Freq: Every day | ORAL | 1 refills | Status: DC

## 2017-02-03 MED ORDER — TRAZODONE HCL 50 MG PO TABS: 50.0000 mg | ORAL_TABLET | Freq: Every evening | ORAL | 1 refills | Status: DC | PRN

## 2017-02-03 NOTE — [facility] Group Notes (Signed)
[facility] LCSW Group Therapy 02/03/2017 1:15 PM  Type of Therapy: Group Therapy- Feelings about Diagnosis  Participation Level: Minimal  Participation Quality:  Disengaged  Affect:  Lethargic  Cognitive: Alert and Oriented   Insight:  Unable to assess  Engagement in Therapy: Developing/Improving and Engaged   Modes of Intervention: Clarification, Confrontation, Discussion, Education, Exploration, Limit-setting, Orientation, Problem-solving, Rapport Building, Dance movement psychotherapist, Socialization and Support  Description of Group:   This group will allow patients to explore their thoughts and feelings about diagnoses they have received. Patients will be guided to explore their level of understanding and acceptance of these diagnoses. Facilitator will encourage patients to process their thoughts and feelings about the reactions of others to their diagnosis, and will guide patients in identifying ways to discuss their diagnosis with significant others in their lives. This group will be process-oriented, with patients participating in exploration of their own experiences as well as giving and receiving support and challenge from other group members.  Summary of Progress/Problems:  Pt did not participate in group discussion  Therapeutic Modalities:   Cognitive Behavioral Therapy Solution Focused Therapy Motivational Interviewing Relapse Prevention Therapy  Vernie Shanks, LCSW 02/03/2017 4:26 PM

## 2017-02-03 NOTE — Progress Notes (Signed)
Denies SI/HI/AVH.  Denies any depression.  Verbalized that he is ready to go so that he can get back to work.  Discharge instructions given, verbalized understanding.  Prescriptions and seven day supply of medication given.  Personal belongings returned.  Escorted off unit by this Clinical research associate to doctors on call parking lot to be transported to bus stop by security.

## 2017-02-03 NOTE — Progress Notes (Signed)
Recreation Therapy Notes  Date: 04.24.18 Time: 9:30 am Location: Craft Room  Group Topic: Goal Setting  Goal Area(s) Addresses:  Patient will write one goal. Patient will write at least one supportive statement.  Behavioral Response: Attentive, Interactive  Intervention: Step By Step  Activity: Patients were given a worksheet with a foot on it. Patients were instructed to write their goal inside the foot and write supportive statements outside of the foot.  Education: LRT educated patients on healthy ways they can celebrate reaching their goals.  Education Outcome: In group clarification offered   Clinical Observations/Feedback: Patient wrote goal and positive statements. Patient contributed to group discussion by stating it is important to have intrinsic motivation.  Jacquelynn Cree, LRT/CTRS 02/03/2017 10:15 AM
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
## 2017-02-03 NOTE — [facility] Group Notes (Signed)
[facility] Group Notes:  (Nursing/MHT/Case Management/Adjunct)  Date:  02/03/2017  Time:  2:08 PM  Type of Therapy:  Psychoeducational Skills  Participation Level:  Minimal  Participation Quality:  Appropriate and Attentive  Affect:  Appropriate  Cognitive:  Appropriate  Insight:  Appropriate  Engagement in Group:  Engaged  Modes of Intervention:  Discussion and Education  Summary of Progress/Problems:  Samuel Martin 02/03/2017, 2:08 PM

## 2017-02-03 NOTE — Plan of Care (Signed)
Problem: [facility] Participation in Recreation Therapeutic Interventions Goal: STG-Other Recreation Therapy Goal (Specify) STG: Self-Expression - Within 4 treatment sessions, patient will verbalize understanding of different ways he can express his emotions in each of 2 treatment sessions to increase healthy self-expression.  Outcome: Completed/Met Date Met: 02/03/17 Treatment Session 2; Completed 2 out of 2: At approximately 10:20 am, LRT met with patient in consultation room. Patient verbalized understanding of different ways for him to express himself. LRT encouraged patient to use them to help him express how he feels.  Leonette Monarch, LRT/CTRS 04.24.18 1:17 pm  Problem: [facility] Participation in Recreation Therapeutic Interventions Goal: STG-Other Recreation Therapy Goal (Specify) STG: Time Management - Within 4 treatment sessions, patient will verbalize understanding of scheduling in each of 2 treatment sessions to increase time management skills.  Outcome: Completed/Met Date Met: 02/03/17 Treatment Session 2; Completed 2 out of 2: At approximately 10:20 am, LRT met with patient in consultation room. Patient verbalized understanding of scheduling. LRT encouraged patient use the schedules to help him manage his time.  Leonette Monarch, LRT/CTRS 04.24.18 1:20 pm
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
## 2017-02-03 NOTE — [facility] Group Notes (Signed)
[facility] Group Notes:  (Nursing/MHT/Case Management/Adjunct)  Date:  02/03/2017  Time:  12:48 AM  Type of Therapy:  Group Therapy  Participation Level:  Active  Participation Quality:  Appropriate  Affect:  Appropriate  Cognitive:  Alert  Insight:  Good  Engagement in Group:  Supportive  Modes of Intervention:  Support  Summary of Progress/Problems:  Mayra Neer 02/03/2017, 12:48 AM

## 2017-02-04 NOTE — Progress Notes (Signed)
Recreation Therapy Notes  INPATIENT RECREATION TR PLAN  Patient Details Name: Samuel Martin MRN: [id number] DOB: 1996/03/06 Today's Date: 02/04/2017  Rec Therapy Plan Is patient appropriate for Therapeutic Recreation?: Yes Treatment times per week: At least once a week TR Treatment/Interventions: 1:1 session, Group participation (Comment) (Appropriate participation in daily recreational therapy tx)  Discharge Criteria Pt will be discharged from therapy if:: Treatment goals are met, Discharged Treatment plan/goals/alternatives discussed and agreed upon by:: Patient/family  Discharge Summary Short term goals set: See Care Plan Short term goals met: Complete Progress toward goals comments: One-to-one attended Which groups?: Leisure education, Coping skills, Goal setting, Other (Comment) (Self-expression) One-to-one attended: Time management, self-expression Reason goals not met: N/A Therapeutic equipment acquired: None Reason patient discharged from therapy: Discharge from hospital Pt/family agrees with progress & goals achieved: Yes Date patient discharged from therapy: 02/03/17   Leonette [facility]arch, LRT/CTRS 02/04/2017, 12:09 PM

## 2017-02-10 LAB — MISC LABCORP TEST (SEND OUT): Labcorp test code: 551700

## 2018-01-17 ENCOUNTER — Other Ambulatory Visit: Payer: Self-pay

## 2018-01-17 ENCOUNTER — Emergency Department (HOSPITAL_COMMUNITY)
Admission: EM | Admit: 2018-01-17 | Discharge: 2018-01-18 | Disposition: A | Discharge status: Other Acute Inpatient Hospital | Payer: Self-pay | Attending: Emergency Medicine | Admitting: Emergency Medicine

## 2018-01-17 ENCOUNTER — Encounter (HOSPITAL_COMMUNITY): Payer: Self-pay | Admitting: *Deleted

## 2018-01-17 DIAGNOSIS — F209 Schizophrenia, unspecified: Secondary | ICD-10-CM | POA: Insufficient documentation

## 2018-01-17 DIAGNOSIS — R443 Hallucinations, unspecified: Secondary | ICD-10-CM

## 2018-01-17 DIAGNOSIS — F17200 Nicotine dependence, unspecified, uncomplicated: Secondary | ICD-10-CM | POA: Insufficient documentation

## 2018-01-17 DIAGNOSIS — Z79899 Other long term (current) drug therapy: Secondary | ICD-10-CM | POA: Insufficient documentation

## 2018-01-17 DIAGNOSIS — F23 Brief psychotic disorder: Secondary | ICD-10-CM | POA: Diagnosis present

## 2018-01-17 DIAGNOSIS — F12951 Cannabis use, unspecified with psychotic disorder with hallucinations: Secondary | ICD-10-CM | POA: Diagnosis present

## 2018-01-17 HISTORY — DX: Schizophrenia, unspecified: F20.9

## 2018-01-17 LAB — RAPID URINE DRUG SCREEN, HOSP PERFORMED
Amphetamines: NOT DETECTED
Barbiturates: NOT DETECTED
Benzodiazepines: NOT DETECTED
COCAINE: NOT DETECTED
OPIATES: NOT DETECTED
Tetrahydrocannabinol: POSITIVE — AB

## 2018-01-17 LAB — CBC
HCT: 45.9 % (ref 39.0–52.0)
HEMOGLOBIN: 15.2 g/dL (ref 13.0–17.0)
MCH: 30 pg (ref 26.0–34.0)
MCHC: 33.1 g/dL (ref 30.0–36.0)
MCV: 90.7 fL (ref 78.0–100.0)
PLATELETS: 120 10*3/uL — AB (ref 150–400)
RBC: 5.06 MIL/uL (ref 4.22–5.81)
RDW: 13.7 % (ref 11.5–15.5)
WBC: 5.8 10*3/uL (ref 4.0–10.5)

## 2018-01-17 LAB — COMPREHENSIVE METABOLIC PANEL
ALBUMIN: 4.3 g/dL (ref 3.5–5.0)
ALK PHOS: 81 U/L (ref 38–126)
ALT: 34 U/L (ref 17–63)
AST: 69 U/L — AB (ref 15–41)
Anion gap: 11 (ref 5–15)
BILIRUBIN TOTAL: 1.6 mg/dL — AB (ref 0.3–1.2)
BUN: 16 mg/dL (ref 6–20)
CALCIUM: 9.3 mg/dL (ref 8.9–10.3)
CO2: 23 mmol/L (ref 22–32)
Chloride: 103 mmol/L (ref 101–111)
Creatinine, Ser: 0.97 mg/dL (ref 0.61–1.24)
GFR calc Af Amer: >60 mL/min (ref >60)
GLUCOSE: 123 mg/dL — AB (ref 65–99)
POTASSIUM: 3.7 mmol/L (ref 3.5–5.1)
Sodium: 137 mmol/L (ref 135–145)
Total Protein: 7.8 g/dL (ref 6.5–8.1)

## 2018-01-17 LAB — ETHANOL

## 2018-01-17 MED ORDER — ZIPRASIDONE MESYLATE 20 MG IM SOLR
10.0000 mg | Freq: Once | INTRAMUSCULAR | Status: AC
  Administered 2018-01-17: 10 mg via INTRAMUSCULAR
  Filled 2018-01-17: qty 20

## 2018-01-17 MED ORDER — STERILE WATER FOR INJECTION IJ SOLN
INTRAMUSCULAR | Status: AC
  Administered 2018-01-17: 18:00:00
  Filled 2018-01-17: qty 10
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
## 2018-01-17 NOTE — ED Notes (Signed)
Patient yelling cursing at officers in room patient keeps attempting to leave room. Geodon administered per protocol.

## 2018-01-17 NOTE — ED Notes (Signed)
[id number] Expected date:  Expected time:  Means of arrival:  Comments: 1428

## 2018-01-17 NOTE — ED Notes (Signed)
Patient admitted on unit. Denies pain, SI/HI, AH/VH at this time. Verbalized no concern. No behavior issues noted. Will continue to monitor patient.

## 2018-01-17 NOTE — ED Notes (Signed)
Bed: [id number] Expected date:  Expected time:  Means of arrival:  Comments: 22 yo psychotic

## 2018-01-17 NOTE — ED Notes (Signed)
Pt move from 28 to 36

## 2018-01-17 NOTE — Progress Notes (Signed)
Per Nira ConnJason Berry, NP pt is recommended for inpt treatment. TCU nurse and EDP Cathren LaineSteinl, Kevin, MD notified of the disposition and in agreement with recommendation. TTS to seek placement.   Princess BruinsAquicha Bensyn Bornemann, MSW, LCSW Therapeutic Triage Specialist  [phone]
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
## 2018-01-17 NOTE — ED Notes (Signed)
Patient standing in his room. Responding to internal stimuli as patient was seen talking and laughing to self.

## 2018-01-18 ENCOUNTER — Encounter (HOSPITAL_COMMUNITY): Payer: Self-pay | Admitting: *Deleted

## 2018-01-18 ENCOUNTER — Other Ambulatory Visit: Payer: Self-pay

## 2018-01-18 ENCOUNTER — Inpatient Hospital Stay (HOSPITAL_COMMUNITY)
Admission: AD | Admit: 2018-01-18 | Discharge: 2018-01-22 | DRG: 885 | Disposition: A | Discharge status: Home / Self Care | Payer: No Typology Code available for payment source | Source: Intra-hospital | Attending: Psychiatry | Admitting: Psychiatry

## 2018-01-18 DIAGNOSIS — F209 Schizophrenia, unspecified: Secondary | ICD-10-CM | POA: Diagnosis present

## 2018-01-18 DIAGNOSIS — F12259 Cannabis dependence with psychotic disorder, unspecified: Secondary | ICD-10-CM | POA: Diagnosis present

## 2018-01-18 DIAGNOSIS — F410 Panic disorder [episodic paroxysmal anxiety] without agoraphobia: Secondary | ICD-10-CM | POA: Diagnosis not present

## 2018-01-18 DIAGNOSIS — R450 Nervousness: Secondary | ICD-10-CM

## 2018-01-18 DIAGNOSIS — F1290 Cannabis use, unspecified, uncomplicated: Secondary | ICD-10-CM

## 2018-01-18 DIAGNOSIS — F24 Shared psychotic disorder: Secondary | ICD-10-CM | POA: Diagnosis present

## 2018-01-18 DIAGNOSIS — G4700 Insomnia, unspecified: Secondary | ICD-10-CM | POA: Diagnosis present

## 2018-01-18 DIAGNOSIS — F23 Brief psychotic disorder: Secondary | ICD-10-CM

## 2018-01-18 DIAGNOSIS — F419 Anxiety disorder, unspecified: Secondary | ICD-10-CM | POA: Diagnosis present

## 2018-01-18 DIAGNOSIS — F17210 Nicotine dependence, cigarettes, uncomplicated: Secondary | ICD-10-CM

## 2018-01-18 DIAGNOSIS — F3160 Bipolar disorder, current episode mixed, unspecified: Principal | ICD-10-CM | POA: Diagnosis present

## 2018-01-18 DIAGNOSIS — Z91013 Allergy to seafood: Secondary | ICD-10-CM | POA: Diagnosis not present

## 2018-01-18 DIAGNOSIS — F1910 Other psychoactive substance abuse, uncomplicated: Secondary | ICD-10-CM | POA: Diagnosis not present

## 2018-01-18 DIAGNOSIS — F312 Bipolar disorder, current episode manic severe with psychotic features: Secondary | ICD-10-CM | POA: Diagnosis present

## 2018-01-18 MED ORDER — LORAZEPAM 1 MG PO TABS: 1.0000 mg | ORAL_TABLET | ORAL | Status: DC | PRN

## 2018-01-18 MED ORDER — TRAZODONE HCL 50 MG PO TABS
50.0000 mg | ORAL_TABLET | Freq: Every evening | ORAL | Status: DC | PRN
  Filled 2018-01-18: qty 1

## 2018-01-18 MED ORDER — ACETAMINOPHEN 325 MG PO TABS: 650.0000 mg | ORAL_TABLET | Freq: Four times a day (QID) | ORAL | Status: DC | PRN

## 2018-01-18 MED ORDER — NICOTINE 21 MG/24HR TD PT24
21.0000 mg | MEDICATED_PATCH | Freq: Every day | TRANSDERMAL | Status: DC
  Administered 2018-01-18 – 2018-01-19 (×2): 21 mg via TRANSDERMAL
  Filled 2018-01-18 (×8): qty 1

## 2018-01-18 MED ORDER — ZIPRASIDONE MESYLATE 20 MG IM SOLR: 20.0000 mg | Freq: Two times a day (BID) | INTRAMUSCULAR | Status: DC | PRN

## 2018-01-18 MED ORDER — ARIPIPRAZOLE 10 MG PO TABS
20.0000 mg | ORAL_TABLET | Freq: Every day | ORAL | Status: DC
  Administered 2018-01-19 – 2018-01-22 (×4): 20 mg via ORAL
  Filled 2018-01-18 (×3): qty 2
  Filled 2018-01-18: qty 14
  Filled 2018-01-18 (×3): qty 2

## 2018-01-18 MED ORDER — DIVALPROEX SODIUM 500 MG PO DR TAB
500.0000 mg | DELAYED_RELEASE_TABLET | Freq: Two times a day (BID) | ORAL | Status: DC
  Administered 2018-01-18 – 2018-01-22 (×8): 500 mg via ORAL
  Filled 2018-01-18 (×6): qty 1
  Filled 2018-01-18 (×2): qty 14
  Filled 2018-01-18 (×6): qty 1

## 2018-01-18 MED ORDER — TRAZODONE HCL 50 MG PO TABS
50.0000 mg | ORAL_TABLET | Freq: Every evening | ORAL | Status: DC | PRN
  Administered 2018-01-18: 50 mg via ORAL
  Filled 2018-01-18: qty 1

## 2018-01-18 MED ORDER — DIVALPROEX SODIUM 500 MG PO DR TAB
500.0000 mg | DELAYED_RELEASE_TABLET | Freq: Two times a day (BID) | ORAL | Status: DC
  Administered 2018-01-18 (×2): 500 mg via ORAL
  Filled 2018-01-18 (×2): qty 1

## 2018-01-18 MED ORDER — TRAZODONE HCL 50 MG PO TABS
50.0000 mg | ORAL_TABLET | Freq: Every evening | ORAL | Status: DC | PRN
  Administered 2018-01-18 – 2018-01-21 (×4): 50 mg via ORAL
  Filled 2018-01-18: qty 7
  Filled 2018-01-18: qty 1
  Filled 2018-01-18: qty 7
  Filled 2018-01-18 (×2): qty 1

## 2018-01-18 MED ORDER — ALUM & MAG HYDROXIDE-SIMETH 200-200-20 MG/5ML PO SUSP: 30.0000 mL | ORAL | Status: DC | PRN

## 2018-01-18 MED ORDER — HYDROXYZINE HCL 25 MG PO TABS
25.0000 mg | ORAL_TABLET | Freq: Three times a day (TID) | ORAL | Status: DC | PRN
  Administered 2018-01-18: 25 mg via ORAL
  Filled 2018-01-18: qty 1

## 2018-01-18 MED ORDER — ARIPIPRAZOLE 10 MG PO TABS
20.0000 mg | ORAL_TABLET | Freq: Every day | ORAL | Status: DC
  Administered 2018-01-18: 20 mg via ORAL
  Filled 2018-01-18: qty 2

## 2018-01-18 MED ORDER — MAGNESIUM HYDROXIDE 400 MG/5ML PO SUSP: 30.0000 mL | Freq: Every day | ORAL | Status: DC | PRN
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
## 2018-01-18 NOTE — ED Notes (Signed)
[facility] Pt was calm and cooperative and appropriate. All belongings returned to pt. Pt verified that they are his belongings.

## 2018-01-18 NOTE — ED Notes (Signed)
Called [employer] for transport to [facility] 

## 2018-01-18 NOTE — Progress Notes (Signed)
Adult Psychoeducational Group Note  Date:  01/18/2018 Time:  9:36 PM  Group Topic/Focus:  Wrap-Up Group:   The focus of this group is to help patients review their daily goal of treatment and discuss progress on daily workbooks.  Participation Level:  Minimal  Participation Quality:  Appropriate  Affect:  Flat  Cognitive:  Oriented  Insight: Appropriate  Engagement in Group:  Engaged  Modes of Intervention:  Socialization and Support  Additional Comments:  Patient attended and participated in group tonight. He reports that [facility]. He have seem or talked to anyone as yet.  Lita MainsFrancis, Gavon Majano [facility] 01/18/2018, 9:36 PM

## 2018-01-18 NOTE — ED Notes (Signed)
[facility]

## 2018-01-18 NOTE — ED Notes (Signed)
This morning pt was manic, dancing, smiling. He was friendly and cooperative and would respond rationally to questions, but then would say something like, "You are all going to kill yourselves", followed by some unintelligible comment. He took his po medication and since taking them is less active and did get into bed for a while. Pt has not been a problem in the milieu.

## 2018-01-18 NOTE — BH Assessment (Signed)
[facility]  Per Juanetta BeetsJacqueline Norman, DO, this pt requires psychiatric hospitalization.  Malva LimesLinsey Strader, RN, [id number]; [facility] will call when they are ready to receive pt.  Pt presents under IVC initiated by EDP Cathren LaineKevin Steinl, MD, and [facility].  Pt's nurse, Diane, has been notified, and agrees to call report to [phone].  Pt is to be transported via [employer].   Doylene Canninghomas Nicholson Starace, [place] Behavioral Health Coordinator [phone]

## 2018-01-18 NOTE — Progress Notes (Signed)
22 year old male pt admitted on involuntary basis. On admission, he presents as having some thought blocking and does take a few extra seconds to answer questions. He does report that he has been hearing voices but denies any SI and is able to contract for safety while in the hospital. He reports that he was supposed to be on medications but does not take them and spoke about how he feels he does not need them. Samuel Martin was hospitalized last year and reports that he did not go for any follow-up treatment. He reports that he lives in a "rooming house" and reports that he will go back there once he is discharged. Samuel Martin was oriented to the unit and safety maintained.

## 2018-01-18 NOTE — Tx Team (Signed)
Initial Treatment Plan 01/18/2018 5:28 PM Samuel Martin ZOX:[id number]    PATIENT STRESSORS: Medication change or noncompliance   PATIENT STRENGTHS: Ability for insight Average or above average intelligence Capable of independent living General fund of knowledge   PATIENT IDENTIFIED PROBLEMS: Psychosis "I was acting bizarre" "I been hearing voices"                     DISCHARGE CRITERIA:  Ability to meet basic life and health needs Improved stabilization in mood, thinking, and/or behavior Verbal commitment to aftercare and medication compliance  PRELIMINARY DISCHARGE PLAN: Attend aftercare/continuing care group Return to previous living arrangement  PATIENT/FAMILY INVOLVEMENT: This treatment plan has been presented to and reviewed with the patient, Samuel ReamsRashon Martin, and/or family member, .  The patient and family have been given the opportunity to ask questions and make suggestions.  Lorette Peterkin, [place], [place]RN 01/18/2018, 5:28 PM

## 2018-01-19 DIAGNOSIS — F410 Panic disorder [episodic paroxysmal anxiety] without agoraphobia: Secondary | ICD-10-CM

## 2018-01-19 DIAGNOSIS — F312 Bipolar disorder, current episode manic severe with psychotic features: Secondary | ICD-10-CM

## 2018-01-19 DIAGNOSIS — G4700 Insomnia, unspecified: Secondary | ICD-10-CM

## 2018-01-19 DIAGNOSIS — F419 Anxiety disorder, unspecified: Secondary | ICD-10-CM

## 2018-01-19 MED ORDER — HYDROXYZINE HCL 25 MG PO TABS
25.0000 mg | ORAL_TABLET | Freq: Three times a day (TID) | ORAL | Status: DC | PRN
  Administered 2018-01-22: 25 mg via ORAL
  Filled 2018-01-19: qty 10
  Filled 2018-01-19: qty 1

## 2018-01-19 MED ORDER — ZIPRASIDONE MESYLATE 20 MG IM SOLR: 20.0000 mg | Freq: Two times a day (BID) | INTRAMUSCULAR | Status: DC | PRN

## 2018-01-19 MED ORDER — LORAZEPAM 1 MG PO TABS: 1.0000 mg | ORAL_TABLET | ORAL | Status: DC | PRN
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
## 2018-01-19 NOTE — Progress Notes (Signed)
Recreation Therapy Notes  INPATIENT RECREATION THERAPY ASSESSMENT  Patient Details Name: Samuel Martin MRN: [id number] DOB: 01/27/1996 Today's Date: 01/19/2018       Information Obtained From: Patient  Able to Participate in Assessment/Interview: Yes  Patient Presentation: Responsive  Reason for Admission (Per Patient): Active Symptoms  Patient Stressors: Patient did not identify any stressors   Coping Skills:   Sports, Music  Leisure Interests (2+):  Being with girlfriend, video games   Frequency of Recreation/Participation: Weekly  Awareness of Community Resources:  Yes  Community Resources:  Dee[employer] CompanyMall, Public house managerMovie Theaters  Current Use: No  If no, Barriers?: Social  Expressed Interest in State Street CorporationCommunity Resource Information: No  Enbridge EnergyCounty of Residence:  [place]   Patient Main Form of Transportation: Walk  Patient Strengths:  Video games   Patient Identified Areas of Improvement:  Patient was not able to identify   Patient Goal for Hospitalization:  Patient was not able to identify   Current SI (including self-harm):  No  Current HI:  No  Current AVH: No  Staff Intervention Plan: Group Attendance, Collaborate with Interdisciplinary Treatment Team  Consent to Intern Participation: Yes   Sheryle Hailarian Judiann Celia, Recreation Therapy Intern   Sheryle HailDarian Pearley [place] 01/19/2018, 3:18 PM
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
## 2018-01-19 NOTE — Progress Notes (Signed)
Recreation Therapy Notes  Date: 4.9.19 Time: 10:00 a.m. Location: [address] Dayroom   Group Topic: Communication   Goal Area(s) Addresses:  Goal 1.1: To improve communication  - Group will communicate with peers during group session    - Group will identify the importance of healthy communication  - Group will answer at least three questions during Recreation Therapy tx  Behavioral Response: Engaged   Intervention: Game   Activity: Jenga: Patients played Jenga as normal. Once a patient pulled out a [place] piece, based on the color of the skittle, the patient answered a specific question for that color.   Education: Communication, Team-Work   Education Outcome: Acknowledges education  Clinical Observations/Feedback: Patient attended and participated appropriately in Recreation Therapy group treatment successfully engaging in group activity. Patient was able to identify the importance of communication. Patient was able to answer at least three questions about communication during Recreation Therapy Group treatment. Patient successfully met Goal 1.1 (see above).   Ranell Patrick, Recreation Therapy Intern   Ranell Patrick 01/19/2018 11:11 AM

## 2018-01-19 NOTE — Progress Notes (Signed)
D:  Patient's self inventory sheet, patient sleeps good, no sleep medication given.  Good appetite, low energy level, good concentration  Denied depression, anxiety, hopeless.  Denied withdrawals.  Denied SI.  Denied physical problems.  Denied physical pain.  Goal is discharge, continue with life.  Plans to talk to MD.  No discharge plans. A:  Medications administered per MD orders.  Emotional support and encouragement given patient. R: Denied SI and HI, contracts for safety.  Denied A/V hallucinations.  Safety maintained with 15 minute checks.

## 2018-01-19 NOTE — [facility] Group Notes (Signed)
LCSW Group Therapy Note   01/19/2018 1:15pm   Type of Therapy and Topic:  Group Therapy:  Positive Affirmations   Participation Level:  Minimal  Description of Group: This group addressed positive affirmation toward self and others. Patients went around the room and identified two positive things about themselves and two positive things about a peer in the room. Patients reflected on how it felt to share something positive with others, to identify positive things about themselves, and to hear positive things from others. Patients were encouraged to have a daily reflection of positive characteristics or circumstances.  Therapeutic Goals 1. Patient will verbalize two of their positive qualities 2. Patient will demonstrate empathy for others by stating two positive qualities about a peer in the group 3. Patient will verbalize their feelings when voicing positive self affirmations and when voicing positive affirmations of others 4. Patients will discuss the potential positive impact on their wellness/recovery of focusing on positive traits of self and others. Summary of Patient Progress:  Was pulled out to see provider during group, attentive while present.  Talked about how he "prepare myself mentally" for situations by reminding himself that he has been successful in the past.  Therapeutic Modalities Cognitive Behavioral Therapy Motivational Interviewing  Samuel RogueRodney B Constance Whittle, LCSW 01/19/2018 2:20 PM

## 2018-01-19 NOTE — Progress Notes (Signed)
Patient ID: Rondell ReamsRashon Martin, male   DOB: 08/11/1996, 22 y.o.   MRN: [id number]  D: Pt observed in bed resting with eyes closed. Pt endorse AH; "they say I should do the right thing." Pt denied SI, HI, anxiety, depression or pain. Pt continue to be cooperative and nonviolent.  A: Pt attended group. Medications administered as prescribed.  Support, encouragement, and safe environment provided.  15-minute safety checks continue. R: Pt was med compliant.  Safety checks continue.
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
## 2018-01-19 NOTE — Progress Notes (Signed)
Adult Psychoeducational Group Note  Date:  01/19/2018 Time:  8:38 PM  Group Topic/Focus:  Wrap-Up Group:   The focus of this group is to help patients review their daily goal of treatment and discuss progress on daily workbooks.  Participation Level:  Active  Participation Quality:  Appropriate  Affect:  Appropriate  Cognitive:  Appropriate  Insight: Appropriate  Engagement in Group:  Engaged  Modes of Intervention:  Discussion  Additional Comments: The patient expressed that he rates today a 7.The patient expressed that he attend all groups.  Octavio Mannshigpen, [place] 01/19/2018, 8:38 PM

## 2018-01-19 NOTE — Plan of Care (Signed)
Nurse discussed depression, anxiety, coping skills with patient.  

## 2018-01-20 LAB — LIPID PANEL
Cholesterol: 144 mg/dL (ref 0–200)
HDL: 71 mg/dL (ref >40)
LDL CALC: 50 mg/dL (ref 0–99)
Total CHOL/HDL Ratio: 2 RATIO
Triglycerides: 113 mg/dL (ref <150)
VLDL: 23 mg/dL (ref 0–40)

## 2018-01-20 LAB — VALPROIC ACID LEVEL: Valproic Acid Lvl: 68 ug/mL (ref 50.0–100.0)

## 2018-01-20 LAB — TSH: TSH: 1.228 u[IU]/mL (ref 0.350–4.500)

## 2018-01-20 LAB — HEMOGLOBIN A1C
HEMOGLOBIN A1C: 5.3 % (ref 4.8–5.6)
MEAN PLASMA GLUCOSE: 105.41 mg/dL

## 2018-01-20 NOTE — [facility] Group Notes (Signed)
LCSW Group Therapy Note   01/20/2018 1:15pm   Type of Therapy and Topic:  Group Therapy:  Trust and Honesty  Participation Level:  Minimal  Description of Group:    In this group patients will be asked to explore the value of being honest.  Patients will be guided to discuss their thoughts, feelings, and behaviors related to honesty and trusting in others. Patients will process together how trust and honesty relate to forming relationships with peers, family members, and self. Each patient will be challenged to identify and express feelings of being vulnerable. Patients will discuss reasons why people are dishonest and identify alternative outcomes if one was truthful (to self or others). This group will be process-oriented, with patients participating in exploration of their own experiences, giving and receiving support, and processing challenge from other group members.   Therapeutic Goals: 1. Patient will identify why honesty is important to relationships and how honesty overall affects relationships.  2. Patient will identify a situation where they lied or were lied too and the  feelings, thought process, and behaviors surrounding the situation 3. Patient will identify the meaning of being vulnerable, how that feels, and how that correlates to being honest with self and others. 4. Patient will identify situations where they could have told the truth, but instead lied and explain reasons of dishonesty.   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 I've been talking to a friend who goes to [employer] about the program there.  I think he can tell me how to enroll." Unable to name anything that would help him get back on track if he loses focus.  Therapeutic Modalities:   Cognitive Behavioral Therapy Solution Focused Therapy Motivational Interviewing Brief Therapy  Ida Rogue, LCSW 01/20/2018 12:21 PM

## 2018-01-20 NOTE — Plan of Care (Signed)
Problem: Activity: Goal: Interest or engagement in activities will improve Intervention: Patient has been provided a daily scheduled and is encouraged by staff to attend groups. Outcome: Patient is visible on the unit, interactive with peers/staff and attending groups. 01/20/2018 10:47 AM - Progressing by Ferrel Loganollazo, Peta Peachey A, RN    Problem: Safety: Goal: Periods of time without injury will increase Intervention: Patient contracts for safety on the unit. Low fall risk precautions in place. Safety monitored with q15 minute checks. Outcome: Patient remains safe on the unit at this time. 01/20/2018 10:47 AM - Progressing by Ferrel Loganollazo, Navon Kotowski A, RN

## 2018-01-20 NOTE — Progress Notes (Signed)
Nursing Progress Note [phone]  Data: Patient presents with flat affect and cautious/minimal behavior. Patient took morning medications without incident. Patient is observed interacting with peers in the dayroom and attending groups. Patient states goal for today is to "find a way to cope with my emotions". Patient reports current coping skill as "self-medicating". Patient with limited insight but rates depression, hopelessness, and anxiety 0,0,0 respectively. Patient denies SI/HI or pain. Patient reports vague complaints of AH. Patient contracts for safety on the unit at this time.  Action: Patient educated about and provided medication per provider's orders. Patient safety maintained with q15 min safety checks. Low fall risk precautions in place. Emotional support given. 1:1 interaction and active listening provided. Patient encouraged to attend meals and groups. Labs, vital signs and patient behavior monitored throughout shift. Patient encouraged to work on treatment plan and goals.  Response: Patient remains safe on the unit at this time. Patient is interacting with peers appropriately on the unit. Will continue to support and monitor.

## 2018-01-20 NOTE — Progress Notes (Signed)
Patient ID: Samuel Martin, male   DOB: 02/14/1996, 22 y.o.   MRN: [id number]  Pt currently presents with a flat affect and guarded behavior. Pt forwards little to Clinical research associatewriter. Seen interacting positively with peers. Pt adheres to medication regimen. Pt reports good sleep with current medication regimen, refuses nighttime prn medication.   Pt provided with medications per providers orders. Pt's labs and vitals were monitored throughout the night. Pt given a 1:1 about emotional and mental status. Pt supported and encouraged to express concerns and questions.  Pt's safety ensured with 15 minute and environmental checks. Pt currently denies SI/HI and A/V hallucinations. Pt verbally agrees to seek staff if SI/HI or A/VH occurs and to consult with staff before acting on any harmful thoughts. Will continue POC.

## 2018-01-20 NOTE — [facility] Suicide Risk Assessment (Signed)
[facility] INPATIENT:  Family/Significant Other Suicide Prevention Education  Suicide Prevention Education:  Education Completed; Samuel Martin, mother, [phone]  has been identified by the patient as the family member/significant other with whom the patient will be residing, and identified as the person(s) who will aid the patient in the event of a mental health crisis (suicidal ideations/suicide attempt).  With written consent from the patient, the family member/significant other has been provided the following suicide prevention education, prior to the and/or following the discharge of the patient.  The suicide prevention education provided includes the following:  Suicide risk factors  Suicide prevention and interventions  National Suicide Hotline telephone number  [facility] assessment telephone number  [facility] Assistance 911  [facility] and/or Residential [facility] telephone number  Request made of family/significant other to:  Remove weapons (e.g., guns, rifles, knives), all items previously/currently identified as safety concern.    Remove drugs/medications (over-the-counter, prescriptions, illicit drugs), all items previously/currently identified as a safety concern.  The family member/significant other verbalizes understanding of the suicide prevention education information provided.  The family member/significant other agrees to remove the items of safety concern listed above.  Samuel Martin 01/20/2018, 4:20 PM

## 2018-01-20 NOTE — Progress Notes (Signed)
Patient ID: Samuel ReamsRashon Martin, male   DOB: 06/11/1996, 22 y.o.   MRN: [id number]  Pt seen at the nurses desk. Offered prn nighttime medication, patient accepts. States "I didn't know I was taking it, they have just been giving it to me." Pt educated on medication and administration. Pt appreciative. Will continue to monitor.

## 2018-01-20 NOTE — Progress Notes (Signed)
Patient ID: Rondell ReamsRashon Martin, male   DOB: 11/03/1995, 22 y.o.   MRN: [id number] D: Pt is isolative and withdrawn to self. Pt denies depression, anxiety, pain, SI, HI or AVH; Pt can however be observed laughing as if to be responding to some internal stimuli. Pt was pleasant and cooperative. A: Support, encouragement, and safe environment provided.  15-minute safety checks continue. R: Safety checks continue. All Pt's complains and questions addressed

## 2018-01-20 NOTE — Tx Team (Signed)
Interdisciplinary Treatment and Diagnostic Plan Update  01/20/2018 Time of Session: 12:07 PM  Samuel Martin MRN: [id number]  Principal Diagnosis: Bipolar I disorder, severe, current or most recent episode manic, with psychotic features, with mixed features ([place])  Secondary Diagnoses: Principal Problem:   Bipolar I disorder, severe, current or most recent episode manic, with psychotic features, with mixed features ([place])   Current Medications:  Current Facility-Administered Medications  Medication Dose Route Frequency Provider Last Rate Last Dose  . acetaminophen (TYLENOL) tablet 650 mg  650 mg Oral Q6H PRN [name], NP      . alum & mag hydroxide-simeth (MAALOX/MYLANTA) 200-200-20 MG/5ML suspension 30 mL  30 mL Oral Q4H PRN [name], NP      . ARIPiprazole (ABILIFY) tablet 20 mg  20 mg Oral Daily [name], NP   20 mg at 01/20/18 0744  . divalproex (DEPAKOTE) DR tablet 500 mg  500 mg Oral Q12H [name], NP   500 mg at 01/20/18 0744  . hydrOXYzine (ATARAX/VISTARIL) tablet 25 mg  25 mg Oral TID PRN [name], MD      . LORazepam (ATIVAN) tablet 1 mg  1 mg Oral PRN [name], MD       And  . ziprasidone (GEODON) injection 20 mg  20 mg Intramuscular Q12H PRN [name], MD      . magnesium hydroxide (MILK OF MAGNESIA) suspension 30 mL  30 mL Oral Daily PRN [name], NP      . nicotine (NICODERM CQ - dosed in mg/24 hours) patch 21 mg  21 mg Transdermal Daily [name], MD   21 mg at 01/19/18 0802  . traZODone (DESYREL) tablet 50 mg  50 mg Oral QHS PRN [name], NP   50 mg at 01/19/18 2141    PTA Medications: Medications Prior to Admission  Medication Sig Dispense Refill Last Dose  . ARIPiprazole (ABILIFY) 20 MG tablet Take 1 tablet (20 mg total) by mouth daily. 30 tablet 0   . ARIPiprazole ER 400 MG SRER Inject 400 mg into the muscle every 28 (twenty-eight)  days. 1 each 1   . cyanocobalamin 1000 MCG tablet Take 1 tablet (1,000 mcg total) by mouth daily. 30 tablet 1   . divalproex (DEPAKOTE) 500 MG DR tablet Take 1 tablet (500 mg total) by mouth every 12 (twelve) hours. 60 tablet 1   . traZODone (DESYREL) 50 MG tablet Take 1 tablet (50 mg total) by mouth at bedtime as needed for sleep. 30 tablet 1     Patient Stressors: Medication change or noncompliance  Patient Strengths: Ability for insight Average or above average intelligence Capable of independent living General fund of knowledge  Treatment Modalities: Medication Management, Group therapy, Case management,  1 to 1 session with clinician, Psychoeducation, Recreational therapy.   Physician Treatment Plan for Primary Diagnosis: Bipolar I disorder, severe, current or most recent episode manic, with psychotic features, with mixed features ([place]) Long Term Goal(s): Improvement in symptoms so as ready for discharge  Short Term Goals: Ability to identify and develop effective coping behaviors will improve Ability to maintain clinical measurements within normal limits will improve Compliance with prescribed medications will improve Ability to identify changes in lifestyle to reduce recurrence of condition will improve Ability to verbalize feelings will improve Ability to demonstrate self-control will improve Ability to identify triggers associated with substance abuse/mental health issues will improve  Medication Management: Evaluate patient's response, side effects,  and tolerance of medication regimen.  Therapeutic Interventions: 1 to 1 sessions, Unit Group sessions and Medication administration.  Evaluation of Outcomes: Progressing  Physician Treatment Plan for Secondary Diagnosis: Principal Problem:   Bipolar I disorder, severe, current or most recent episode manic, with psychotic features, with mixed features ([place])   Long Term Goal(s): Improvement in symptoms so as ready for  discharge  Short Term Goals: Ability to identify and develop effective coping behaviors will improve Ability to maintain clinical measurements within normal limits will improve Compliance with prescribed medications will improve Ability to identify changes in lifestyle to reduce recurrence of condition will improve Ability to verbalize feelings will improve Ability to demonstrate self-control will improve Ability to identify triggers associated with substance abuse/mental health issues will improve  Medication Management: Evaluate patient's response, side effects, and tolerance of medication regimen.  Therapeutic Interventions: 1 to 1 sessions, Unit Group sessions and Medication administration.  Evaluation of Outcomes: Progressing   RN Treatment Plan for Primary Diagnosis: Bipolar I disorder, severe, current or most recent episode manic, with psychotic features, with mixed features ([place]) Long Term Goal(s): Knowledge of disease and therapeutic regimen to maintain health will improve  Short Term Goals: Ability to identify and develop effective coping behaviors will improve and Compliance with prescribed medications will improve  Medication Management: RN will administer medications as ordered by provider, will assess and evaluate patient's response and provide education to patient for prescribed medication. RN will report any adverse and/or side effects to prescribing provider.  Therapeutic Interventions: 1 on 1 counseling sessions, Psychoeducation, Medication administration, Evaluate responses to treatment, Monitor vital signs and CBGs as ordered, Perform/monitor CIWA, COWS, AIMS and Fall Risk screenings as ordered, Perform wound care treatments as ordered.  Evaluation of Outcomes: Progressing   LCSW Treatment Plan for Primary Diagnosis: Bipolar I disorder, severe, current or most recent episode manic, with psychotic features, with mixed features ([place]) Long Term Goal(s): Safe transition  to appropriate next level of care at discharge, Engage patient in therapeutic group addressing interpersonal concerns.  Short Term Goals: Engage patient in aftercare planning with referrals and resources  Therapeutic Interventions: Assess for all discharge needs, 1 to 1 time with Social worker, Explore available resources and support systems, Assess for adequacy in community support network, Educate family and significant other(s) on suicide prevention, Complete Psychosocial Assessment, Interpersonal group therapy.  Evaluation of Outcomes: Met  Return home, follow up [facility]   Progress in Treatment: Attending groups: Yes Participating in groups: Yes Taking medication as prescribed: Yes Toleration medication: Yes, no side effects reported at this time Family/Significant other contact made: No Patient understands diagnosis: No Limited insight Discussing patient identified problems/goals with staff: Yes Medical problems stabilized or resolved: Yes Denies suicidal/homicidal ideation: Yes Issues/concerns per patient self-inventory: None Other: N/A  New problem(s) identified: None identified at this time.   New Short Term/Long Term Goal(s): "What can you accomplish here?  I don't know.  I guess I'll just chill."  Discharge Plan or Barriers:   Reason for Continuation of Hospitalization: Psychosis  Mania  Medication stabilization   Estimated Length of Stay: 4/15  Attendees: Patient: Samuel Martin 01/20/2018  12:07 PM  Physician: Maris Berger, MD 01/20/2018  12:07 PM  Nursing: Elesa Massed, RN 01/20/2018  12:07 PM  RN Care Manager: Lars Pinks, RN 01/20/2018  12:07 PM  Social Worker: Ripley Fraise 01/20/2018  12:07 PM  Recreational Therapist: Winfield Cunas 01/20/2018  12:07 PM  Other: Norberto Sorenson 01/20/2018  12:07 PM  Other:  01/20/2018  12:07 PM    Scribe for Treatment Team:  Roque Lias LCSW 01/20/2018 12:07 PM

## 2018-01-20 NOTE — Progress Notes (Signed)
Recreation Therapy Notes  Date: 4.10.19 Time: 10:00 a.m.  Location: [address] Dayroom   Group Topic: Goal Setting, Positivity   Goal Area(s) Addresses:  Goal 1.1: Patients will identify different skills they use to create a positive life style - Patient will identify at least one goal for a positive lifestyle  - Patient will identify the importance of a positive lifestyle  - Patient will participate in Recreation Therapy group tx.   Behavioral Response: Appropriate   Intervention: Arts and Crafts   Activity: For the first part of the activity, patients were asked to try to balance a plastic egg on a flat surface. Patients realized that the egg was not be able to stand on its own without any support. Recreation Therapy Intern then passed out a cup of salt to the patients and ask them to try the task again. Having the patient resort to using salt illustrates that many things are possible but may require some outside-the-box thinking to achieve, just like with goals. After processing with patients, they had the opportunity to decorate their egg using the paint provided.   Education: Goal Setting, Positivity   Education Outcome: Acknowledges Education  Clinical Observations/Feedback: Patient attended and participated appropriately during Recreation Therapy group treatment successfully identifying at least one goal for a positive lifestyle. Patient communicated with peers in a appropriate manner. Patient was able to identify the importance of having a positive lifestyle. Patient participated during opening and closing discussion. Patient successfully met Goal 1.1 (See Above).   Ranell Patrick, Recreation Therapy Intern   Ranell Patrick 01/20/2018 9:43 AM
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
## 2018-01-21 DIAGNOSIS — F17210 Nicotine dependence, cigarettes, uncomplicated: Secondary | ICD-10-CM

## 2018-01-21 DIAGNOSIS — F1910 Other psychoactive substance abuse, uncomplicated: Secondary | ICD-10-CM

## 2018-01-21 LAB — PROLACTIN: PROLACTIN: 4.1 ng/mL (ref 4.0–15.2)

## 2018-01-21 MED ORDER — ARIPIPRAZOLE ER 400 MG IM SRER
400.0000 mg | INTRAMUSCULAR | Status: DC
  Administered 2018-01-22: 400 mg via INTRAMUSCULAR

## 2018-01-21 NOTE — Progress Notes (Signed)
Nursing Progress Note [phone]  Data: Patient presents calm, pleasant and cooperative. Patient complaint with scheduled medications. Patient denies SI/HI/AVH or pain. Patient contracts for safety on the unit at this time. Patient completed self-inventory sheet and rates depression, hopelessness, and anxiety 0,0,0 respectively. Patient states goal for today is to "comply with anything my doctor asks" and "figure out why I am here". Patient agrees to make needs known to staff.  Action: Patient educated about and provided medication per provider's orders. Patient safety maintained with q15 min safety checks. Low fall risk precautions in place. Emotional support given. 1:1 interaction and active listening provided. Patient encouraged to attend meals and groups. Labs, vital signs and patient behavior monitored throughout shift. Patient encouraged to work on treatment plan and goals.  Response: Patient remains safe on the unit at this time. Patient is interacting with peers appropriately on the unit. Will continue to support and monitor.

## 2018-01-21 NOTE — Progress Notes (Signed)
Patient ID: Rondell ReamsRashon Pesch, male   DOB: 01/25/1996, 22 y.o.   MRN: [id number]   D: Patient pleasant and cooperative with care this shift and is noted to interact well with peers in the milieu.  A: Encourage staff/peer interaction, medication compliance, and group participation. Administer medications as ordered, maintain Q 15 minute safety checks. R: Pt compliant with medications and attended group session. Pt denies SI at this time and verbally contracts for safety. No signs/symptoms of distress noted.

## 2018-01-21 NOTE — Progress Notes (Signed)
D: Pt went to breakfast. Pt seen up in day room.  Pt attends group.  Pt commplient with medications.  Pt pleasant and denies SI/HI and A/V hallucinations.  Pt reports no pain.  Pt reports groups are good and feels he is learning from them.  Pt contract for safety.  Pt reports good nights sleep. A: Pt given prescribed medications.  Pt educated on medications given.  Pt reminded to seek staff with any needs. R: Pt wearing non-skid footwear.  15 minute safety checks.  Pt contract for safety.  Will continue to monitor.

## 2018-01-21 NOTE — Progress Notes (Signed)
Patient states that he had a good day overall and that he enjoyed going outside and "working out". His goal for tomorrow is to work on his discharge plans. In addition, he wants to work on remaining on his medication.

## 2018-01-21 NOTE — [facility] Group Notes (Signed)
[facility] LCSW Group Therapy 01/21/2018 1:15pm  Type of Therapy: Group Therapy- Feelings Around Discharge & Establishing a Supportive Framework  Participation Level:  Active  Description of Group:   What is a supportive framework? What does it look like feel like and how do I discern it from and unhealthy non-supportive network? Learn how to cope when supports are not helpful and don't support you. Discuss what to do when your family/friends are not supportive.  Summary of Patient Progress  Samuel Martin came to group and remained there the entire time but did sleep for part of the session.  At the end he shared that His mother is his strength that keeps his recovery going.   Therapeutic Modalities:   Cognitive Behavioral Therapy Person-Centered Therapy Motivational Interviewing   Aram Beechamngel M Cale [place]olis, Student-Social Work 01/21/2018 2:22 PM

## 2018-01-21 NOTE — Progress Notes (Signed)
Recreation Therapy Notes  Date: 4.11.19 Time: 10:00 a.m.  Location: [address] Dayroom   Group Topic: Self-Awareness   Goal Area(s) Addresses:  Goal 1.1: To increase self-awareness   - Patient will identify the importance of self-awareness  - Patient will identify at least three things of how others view them  - Patient will identify at least three things of how they view themselves   Behavioral Response: Appropriate   Intervention: Craft   Activity: Patients were instructed to decorate the outside of their bag using the magazines provided to represent how other people see them and put things inside that represent how they really are on the inside. At the end of the session, patients shared their bags with one another.  Education: Chief Executive Officer Education  Clinical Observations/Feedback: Patient attended and participated appropriately during Recreation Therapy group treatment successfully identifying the importance of self awareness. Patient was able to identify at least three things of how others view them. Patient was able to identify at least three things of how they view themselves. Patient was hesitant to present bag in front of peers because he felt like he did not do it right. Patient was encouraged by Recreation Therapy Intern and peers to present bag. Patient actively listened during opening and closing discussion. Patient successfully met Goal 1.1 (See Above).   Ranell Patrick, Recreation Therapy Intern   Ranell Patrick 01/21/2018 12:36 PM
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
## 2018-01-22 MED ORDER — NICOTINE 21 MG/24HR TD PT24: 21.0000 mg | MEDICATED_PATCH | Freq: Every day | TRANSDERMAL | 0 refills | Status: DC

## 2018-01-22 MED ORDER — DIVALPROEX SODIUM 500 MG PO DR TAB: 500.0000 mg | DELAYED_RELEASE_TABLET | Freq: Two times a day (BID) | ORAL | 0 refills | Status: DC

## 2018-01-22 MED ORDER — HYDROXYZINE HCL 25 MG PO TABS: 25.0000 mg | ORAL_TABLET | Freq: Three times a day (TID) | ORAL | 0 refills | Status: DC | PRN

## 2018-01-22 MED ORDER — ARIPIPRAZOLE ER 400 MG IM SRER: 400.0000 mg | INTRAMUSCULAR | 0 refills | Status: DC

## 2018-01-22 MED ORDER — ARIPIPRAZOLE 20 MG PO TABS: 20.0000 mg | ORAL_TABLET | Freq: Every day | ORAL | 0 refills | Status: DC

## 2018-01-22 MED ORDER — CYANOCOBALAMIN 1000 MCG PO TABS: 1000.0000 ug | ORAL_TABLET | Freq: Every day | ORAL | 0 refills | Status: DC

## 2018-01-22 MED ORDER — TRAZODONE HCL 50 MG PO TABS: 50.0000 mg | ORAL_TABLET | Freq: Every evening | ORAL | 1 refills | Status: DC | PRN
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
## 2018-01-22 NOTE — Plan of Care (Signed)
4.12.19. Patient attended and participated appropriately during Recreation Therapy group tx. Engaging in groups with a calm and appropriate mood at least 2x within 5 recreation therapy group sessions
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
## 2018-01-22 NOTE — Progress Notes (Addendum)
Recreation Therapy Notes  Date: 4.12.19 Time: 10 a.m. Location: [address] Dayroom   Group Topic: Stress Management, Forgiveness    Goal Area(s) Addresses:  Goal 1.1: To reduce stress  -Patient will identify how forgiveness can be beneficial  -Patient will identify the importance of stress management  -Patient will participate during Recreation Therapy group tx.    Behavioral Response: Engaged   Intervention: Stress Management   Activity: Meditation- Patients were in a peaceful environment with soft lighting enhancing patients mood. Patients listened to a forgiveness meditation on the calm app to help decrease stress levels   Education: Stress Management, Discharge Planning.    Education Outcome: Acknowledges edcuation/In group clarification offered/Needs additional education   Clinical Observations/Feedback: Patient attended and participated appropriately during Recreation Therapy group treatment successfully identifying how forgiveness can be beneficial. Patient was able to identify the importance of stress management. Patient participated during opening and closing discussion. Patient successfully met Goal 1.1 (See Above)    Ranell Patrick, Recreation Therapy Intern   Ranell Patrick 01/22/2018 11:04 AM

## 2018-01-22 NOTE — Progress Notes (Signed)
Pt is prepared for dc as he completes his daily assessment and on this he wrote he denied SI today and he rated his depression, hopelessne4ss and anxiety " 0/0/0", respectively. HIs dc instructions are reviewed with him, he states he understands and he is then given cc of these instructions ( SRA, AVS, SSP and trasition record). ALL of his belongings previously locked in his locker were returned to him and then he was escorted to bldg entrance.

## 2018-01-22 NOTE — Tx Team (Signed)
Interdisciplinary Treatment and Diagnostic Plan Update  01/22/2018 Time of Session: 10:57 AM  Samuel Martin MRN: [id number]  Principal Diagnosis: Bipolar I disorder, severe, current or most recent episode manic, with psychotic features, with mixed features ([place])  Secondary Diagnoses: Principal Problem:   Bipolar I disorder, severe, current or most recent episode manic, with psychotic features, with mixed features ([place])   Current Medications:  Current Facility-Administered Medications  Medication Dose Route Frequency Provider Last Rate Last Dose  . acetaminophen (TYLENOL) tablet 650 mg  650 mg Oral Q6H PRN [name], NP      . alum & mag hydroxide-simeth (MAALOX/MYLANTA) 200-200-20 MG/5ML suspension 30 mL  30 mL Oral Q4H PRN [name], NP      . ARIPiprazole (ABILIFY) tablet 20 mg  20 mg Oral Daily [name], NP   20 mg at 01/22/18 0718  . ARIPiprazole ER (ABILIFY MAINTENA) injection 400 mg  400 mg Intramuscular Q28 days Nwoko, Agnes I, NP      . divalproex (DEPAKOTE) DR tablet 500 mg  500 mg Oral Q12H [name], NP   500 mg at 01/22/18 0717  . hydrOXYzine (ATARAX/VISTARIL) tablet 25 mg  25 mg Oral TID PRN [name], MD   25 mg at 01/22/18 0221  . LORazepam (ATIVAN) tablet 1 mg  1 mg Oral PRN [name], MD       And  . ziprasidone (GEODON) injection 20 mg  20 mg Intramuscular Q12H PRN [name], MD      . magnesium hydroxide (MILK OF MAGNESIA) suspension 30 mL  30 mL Oral Daily PRN [name], NP      . nicotine (NICODERM CQ - dosed in mg/24 hours) patch 21 mg  21 mg Transdermal Daily [name], MD   21 mg at 01/19/18 0802  . traZODone (DESYREL) tablet 50 mg  50 mg Oral QHS PRN [name], NP   50 mg at 01/21/18 2035    PTA Medications: Medications Prior to Admission  Medication Sig Dispense Refill Last Dose  . ARIPiprazole ER 400 MG SRER Inject 400 mg into  the muscle every 28 (twenty-eight) days. 1 each 1   . [DISCONTINUED] ARIPiprazole (ABILIFY) 20 MG tablet Take 1 tablet (20 mg total) by mouth daily. 30 tablet 0   . [DISCONTINUED] cyanocobalamin 1000 MCG tablet Take 1 tablet (1,000 mcg total) by mouth daily. 30 tablet 1   . [DISCONTINUED] divalproex (DEPAKOTE) 500 MG DR tablet Take 1 tablet (500 mg total) by mouth every 12 (twelve) hours. 60 tablet 1   . [DISCONTINUED] traZODone (DESYREL) 50 MG tablet Take 1 tablet (50 mg total) by mouth at bedtime as needed for sleep. 30 tablet 1     Patient Stressors: Medication change or noncompliance  Patient Strengths: Ability for insight Average or above average intelligence Capable of independent living General fund of knowledge  Treatment Modalities: Medication Management, Group therapy, Case management,  1 to 1 session with clinician, Psychoeducation, Recreational therapy.   Physician Treatment Plan for Primary Diagnosis: Bipolar I disorder, severe, current or most recent episode manic, with psychotic features, with mixed features ([place]) Long Term Goal(s): Improvement in symptoms so as ready for discharge  Short Term Goals: Ability to identify and develop effective coping behaviors will improve Ability to maintain clinical measurements within normal limits will improve Compliance with prescribed medications will improve Ability to identify changes in lifestyle to reduce recurrence of condition will improve Ability to  verbalize feelings will improve Ability to demonstrate self-control will improve Ability to identify triggers associated with substance abuse/mental health issues will improve  Medication Management: Evaluate patient's response, side effects, and tolerance of medication regimen.  Therapeutic Interventions: 1 to 1 sessions, Unit Group sessions and Medication administration.  Evaluation of Outcomes: Progressing  Physician Treatment Plan for Secondary Diagnosis: Principal  Problem:   Bipolar I disorder, severe, current or most recent episode manic, with psychotic features, with mixed features ([place])   Long Term Goal(s): Improvement in symptoms so as ready for discharge  Short Term Goals: Ability to identify and develop effective coping behaviors will improve Ability to maintain clinical measurements within normal limits will improve Compliance with prescribed medications will improve Ability to identify changes in lifestyle to reduce recurrence of condition will improve Ability to verbalize feelings will improve Ability to demonstrate self-control will improve Ability to identify triggers associated with substance abuse/mental health issues will improve  Medication Management: Evaluate patient's response, side effects, and tolerance of medication regimen.  Therapeutic Interventions: 1 to 1 sessions, Unit Group sessions and Medication administration.  Evaluation of Outcomes: Progressing   RN Treatment Plan for Primary Diagnosis: Bipolar I disorder, severe, current or most recent episode manic, with psychotic features, with mixed features ([place]) Long Term Goal(s): Knowledge of disease and therapeutic regimen to maintain health will improve  Short Term Goals: Ability to identify and develop effective coping behaviors will improve and Compliance with prescribed medications will improve  Medication Management: RN will administer medications as ordered by provider, will assess and evaluate patient's response and provide education to patient for prescribed medication. RN will report any adverse and/or side effects to prescribing provider.  Therapeutic Interventions: 1 on 1 counseling sessions, Psychoeducation, Medication administration, Evaluate responses to treatment, Monitor vital signs and CBGs as ordered, Perform/monitor CIWA, COWS, AIMS and Fall Risk screenings as ordered, Perform wound care treatments as ordered.  Evaluation of Outcomes:  Progressing   LCSW Treatment Plan for Primary Diagnosis: Bipolar I disorder, severe, current or most recent episode manic, with psychotic features, with mixed features ([place]) Long Term Goal(s): Safe transition to appropriate next level of care at discharge, Engage patient in therapeutic group addressing interpersonal concerns.  Short Term Goals: Engage patient in aftercare planning with referrals and resources  Therapeutic Interventions: Assess for all discharge needs, 1 to 1 time with Social worker, Explore available resources and support systems, Assess for adequacy in community support network, Educate family and significant other(s) on suicide prevention, Complete Psychosocial Assessment, Interpersonal group therapy.  Evaluation of Outcomes: Met  Return home, follow up [facility]   Progress in Treatment: Attending groups: Yes Participating in groups: Yes Taking medication as prescribed: Yes Toleration medication: Yes, no side effects reported at this time Family/Significant other contact made: Samuel Martin [phone] (Mother)  Patient understands diagnosis: No Limited insight Discussing patient identified problems/goals with staff: Yes Medical problems stabilized or resolved: Yes Denies suicidal/homicidal ideation: Yes Issues/concerns per patient self-inventory: None Other: N/A  New problem(s) identified: None identified at this time.   New Short Term/Long Term Goal(s): "What can you accomplish here?  I don't know.  I guess I'll just chill."  Discharge Plan or Barriers:  [name] will return to the boarding house and will follow up with [place].   Reason for Continuation of Hospitalization: Psychosis  Mania  Medication stabilization   Estimated Length of Stay: 01/22/18 adequate for discharge   Attendees: Patient: Samuel Martin 01/22/2018  10:57 AM  Physician: Maris Berger, MD  01/22/2018  10:57 AM  Nursing: Elesa Massed, RN 01/22/2018  10:57 AM  RN Care Manager:  Lars Pinks, RN 01/22/2018  10:57 AM  Social Worker: Ripley Fraise 01/22/2018  10:57 AM  Recreational Therapist: Winfield Cunas 01/22/2018  10:57 AM  Other: Norberto Sorenson 01/22/2018  10:57 AM  Other:  01/22/2018  10:57 AM    Scribe for Treatment Team:  Roque Lias LCSW 01/22/2018 10:57 AM

## 2018-01-22 NOTE — Progress Notes (Signed)
  [facility] Adult Case Management Discharge Plan :  Will you be returning to the same living situation after discharge:  Yes,  Home  At discharge, do you have transportation home?: Yes,  Family  Do you have the ability to pay for your medications: Yes,  Family   Release of information consent forms completed and in the chart;  Patient's signature needed at discharge.  Patient to Follow up at: Follow-up Information    [facility] Follow up on 01/27/2018.   Why:  Wednesday at [facility]8AM for your hospital follow up appointment.  Bring ID and hospital d/c paperwork Contact information: [address] [place] [address] [phone]           Next level of care provider [facility] Link:no  Safety Planning and Suicide Prevention discussed: Yes,  Yes  Have you used any form of tobacco in the last 30 days? (Cigarettes, Smokeless Tobacco, Cigars, and/or Pipes): Yes  Has patient been referred to the Quitline?: Patient refused referral  Patient has been referred for addiction treatment: Pt. refused referral  Aram BeechamAngel M Tiya Schrupp, Student-Social Work 01/22/2018, 9:30 AM

## 2018-01-22 NOTE — Progress Notes (Signed)
Recreation Therapy Notes  INPATIENT RECREATION TR PLAN  Patient Details Name: Samuel Martin MRN: [id number] DOB: October 02, 1996 Today's Date: 01/22/2018  Rec Therapy Plan Is patient appropriate for Therapeutic Recreation?: Yes Treatment times per week: At least three  Estimated Length of Stay: 5-7 days  TR Treatment/Interventions: Group participation   Discharge Criteria Pt will be discharged from therapy if:: Discharged Treatment plan/goals/alternatives discussed and agreed upon by:: Patient/family  Discharge Summary Short term goals set: See Care Plan  Short term goals met: Complete Progress toward goals comments: Groups attended Which groups?: Stress management, Communication, Goal setting, Self-Awareness  Therapeutic equipment acquired: None  Reason patient discharged from therapy: Discharge from hospital Pt/family agrees with progress & goals achieved: Yes Date patient discharged from therapy: 01/22/18  Ranell Patrick, Recreation Therapy Intern   Ranell Patrick 01/22/2018, 2:47 PM
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
## 2019-02-03 ENCOUNTER — Other Ambulatory Visit: Payer: Self-pay

## 2019-02-03 ENCOUNTER — Encounter (HOSPITAL_COMMUNITY): Payer: Self-pay | Admitting: Emergency Medicine

## 2019-02-03 ENCOUNTER — Emergency Department (HOSPITAL_COMMUNITY)
Admission: EM | Admit: 2019-02-03 | Discharge: 2019-02-04 | Disposition: A | Discharge status: Other Acute Inpatient Hospital | Payer: Self-pay | Attending: Emergency Medicine | Admitting: Emergency Medicine

## 2019-02-03 DIAGNOSIS — F22 Delusional disorders: Secondary | ICD-10-CM | POA: Insufficient documentation

## 2019-02-03 DIAGNOSIS — Z046 Encounter for general psychiatric examination, requested by authority: Secondary | ICD-10-CM | POA: Insufficient documentation

## 2019-02-03 DIAGNOSIS — R451 Restlessness and agitation: Secondary | ICD-10-CM | POA: Insufficient documentation

## 2019-02-03 DIAGNOSIS — F17200 Nicotine dependence, unspecified, uncomplicated: Secondary | ICD-10-CM | POA: Insufficient documentation

## 2019-02-03 DIAGNOSIS — Z79899 Other long term (current) drug therapy: Secondary | ICD-10-CM | POA: Insufficient documentation

## 2019-02-03 DIAGNOSIS — F918 Other conduct disorders: Secondary | ICD-10-CM | POA: Insufficient documentation

## 2019-02-03 DIAGNOSIS — F23 Brief psychotic disorder: Secondary | ICD-10-CM | POA: Insufficient documentation

## 2019-02-03 LAB — COMPREHENSIVE METABOLIC PANEL
ALT: 38 U/L (ref 0–44)
AST: 60 U/L — ABNORMAL HIGH (ref 15–41)
Albumin: 4.8 g/dL (ref 3.5–5.0)
Alkaline Phosphatase: 87 U/L (ref 38–126)
Anion gap: 10 (ref 5–15)
BUN: 18 mg/dL (ref 6–20)
CO2: 25 mmol/L (ref 22–32)
Calcium: 9.6 mg/dL (ref 8.9–10.3)
Chloride: 106 mmol/L (ref 98–111)
Creatinine, Ser: 1.12 mg/dL (ref 0.61–1.24)
GFR calc Af Amer: >60 mL/min (ref >60)
GFR calc non Af Amer: >60 mL/min (ref >60)
Glucose, Bld: 74 mg/dL (ref 70–99)
Potassium: 4 mmol/L (ref 3.5–5.1)
Sodium: 141 mmol/L (ref 135–145)
Total Bilirubin: 1.4 mg/dL — ABNORMAL HIGH (ref 0.3–1.2)
Total Protein: 8 g/dL (ref 6.5–8.1)

## 2019-02-03 LAB — RAPID URINE DRUG SCREEN, HOSP PERFORMED
Amphetamines: NOT DETECTED
Barbiturates: NOT DETECTED
Benzodiazepines: NOT DETECTED
Cocaine: NOT DETECTED
Opiates: NOT DETECTED
Tetrahydrocannabinol: POSITIVE — AB

## 2019-02-03 LAB — CBC WITH DIFFERENTIAL/PLATELET
Abs Immature Granulocytes: 0.02 10*3/uL (ref 0.00–0.07)
Basophils Absolute: 0 10*3/uL (ref 0.0–0.1)
Basophils Relative: 1 %
Eosinophils Absolute: 0.1 10*3/uL (ref 0.0–0.5)
Eosinophils Relative: 1 %
HCT: 50.6 % (ref 39.0–52.0)
Hemoglobin: 15.8 g/dL (ref 13.0–17.0)
Immature Granulocytes: 0 %
Lymphocytes Relative: 45 %
Lymphs Abs: 2.5 10*3/uL (ref 0.7–4.0)
MCH: 29.1 pg (ref 26.0–34.0)
MCHC: 31.2 g/dL (ref 30.0–36.0)
MCV: 93.2 fL (ref 80.0–100.0)
Monocytes Absolute: 0.6 10*3/uL (ref 0.1–1.0)
Monocytes Relative: 10 %
Neutro Abs: 2.4 10*3/uL (ref 1.7–7.7)
Neutrophils Relative %: 43 %
Platelets: 100 10*3/uL — ABNORMAL LOW (ref 150–400)
RBC: 5.43 MIL/uL (ref 4.22–5.81)
RDW: 13.2 % (ref 11.5–15.5)
WBC: 5.6 10*3/uL (ref 4.0–10.5)
nRBC: 0 % (ref 0.0–0.2)

## 2019-02-03 LAB — URINALYSIS, ROUTINE W REFLEX MICROSCOPIC
Bilirubin Urine: NEGATIVE
Glucose, UA: NEGATIVE mg/dL
Hgb urine dipstick: NEGATIVE
Ketones, ur: NEGATIVE mg/dL
Nitrite: POSITIVE — AB
Protein, ur: NEGATIVE mg/dL
Specific Gravity, Urine: 1.021 (ref 1.005–1.030)
pH: 6 (ref 5.0–8.0)

## 2019-02-03 LAB — ETHANOL: Alcohol, Ethyl (B): <10 mg/dL (ref <10)

## 2019-02-03 MED ORDER — HYDROXYZINE HCL 25 MG PO TABS
25.0000 mg | ORAL_TABLET | Freq: Three times a day (TID) | ORAL | Status: DC | PRN
  Administered 2019-02-03: 25 mg via ORAL
  Filled 2019-02-03: qty 1

## 2019-02-03 MED ORDER — NICOTINE 21 MG/24HR TD PT24: 21.0000 mg | MEDICATED_PATCH | Freq: Every day | TRANSDERMAL | Status: DC

## 2019-02-03 MED ORDER — VITAMIN B-12 1000 MCG PO TABS
1000.0000 ug | ORAL_TABLET | Freq: Every day | ORAL | Status: DC
  Administered 2019-02-03: 1000 ug via ORAL
  Filled 2019-02-03: qty 1

## 2019-02-03 MED ORDER — HALOPERIDOL LACTATE 5 MG/ML IJ SOLN: 10.0000 mg | Freq: Four times a day (QID) | INTRAMUSCULAR | Status: DC | PRN

## 2019-02-03 MED ORDER — DIVALPROEX SODIUM 500 MG PO DR TAB
500.0000 mg | DELAYED_RELEASE_TABLET | Freq: Two times a day (BID) | ORAL | Status: DC
  Administered 2019-02-03: 500 mg via ORAL
  Filled 2019-02-03: qty 1

## 2019-02-03 MED ORDER — TRAZODONE HCL 50 MG PO TABS
50.0000 mg | ORAL_TABLET | Freq: Every evening | ORAL | Status: DC | PRN
  Administered 2019-02-03: 50 mg via ORAL
  Filled 2019-02-03: qty 1

## 2019-02-03 MED ORDER — ARIPIPRAZOLE 10 MG PO TABS
20.0000 mg | ORAL_TABLET | Freq: Every day | ORAL | Status: DC
  Administered 2019-02-03: 20 mg via ORAL
  Filled 2019-02-03 (×2): qty 2

## 2019-02-03 MED ORDER — OLANZAPINE 10 MG PO TBDP
10.0000 mg | ORAL_TABLET | Freq: Once | ORAL | Status: AC
  Administered 2019-02-03: 10 mg via ORAL
  Filled 2019-02-03: qty 1

## 2019-02-03 NOTE — BH Assessment (Addendum)
Clinician was granted verbal consent by pt to contact pt's mother for collateral. The information gathered in that phone call can be found in pt's [facility] Assessment dated and timed 04/23 7:53 PM.  [name], mother: [phone]
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
## 2019-02-03 NOTE — ED Notes (Signed)
Tele-psych at bedside.

## 2019-02-03 NOTE — ED Notes (Signed)
Patient becoming agitated. MD made aware.

## 2019-02-03 NOTE — ED Triage Notes (Signed)
Patient dropped off by mother states "I don't know why I am here." Denies alcohol and drug use. Hx schizophrenia and psychosis.

## 2019-02-03 NOTE — ED Notes (Signed)
Patient resting with eyes closed. Equal chest rise and fall noted. 

## 2019-02-03 NOTE — ED Notes (Signed)
Patient continues to ambulate to restroom, refusing to provide urine sample.

## 2019-02-03 NOTE — ED Notes (Signed)
Sitter at bedside.

## 2019-02-03 NOTE — ED Notes (Signed)
MD made aware patient is refusing medications.

## 2019-02-03 NOTE — ED Notes (Signed)
Patient given sandwich and water

## 2019-02-03 NOTE — ED Notes (Signed)
ED Provider at bedside. 

## 2019-02-03 NOTE — ED Notes (Signed)
Bed: [facility] Expected date:  Expected time:  Means of arrival:  Comments: 

## 2019-02-03 NOTE — ED Notes (Signed)
Waiting on police to serve pt with IVC, have called [employer]
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
## 2019-02-03 NOTE — ED Notes (Signed)
Patient provided with water.

## 2019-02-03 NOTE — ED Notes (Signed)
Belongings removed from patient.

## 2019-02-03 NOTE — BH Assessment (Signed)
Referrals faxed to the following hospitals: [facility], [address], [place], [facility], [facility], [facility] [place], [place], [place], [place], [place], [place], [place]. Pending review

## 2019-02-03 NOTE — ED Notes (Signed)
Patient changed into paper scrubs with security at bedside.

## 2019-02-04 ENCOUNTER — Inpatient Hospital Stay (HOSPITAL_COMMUNITY)
Admission: AD | Admit: 2019-02-04 | Discharge: 2019-02-07 | DRG: 885 | Disposition: A | Discharge status: Home / Self Care | Payer: No Typology Code available for payment source | Attending: Psychiatry | Admitting: Psychiatry

## 2019-02-04 ENCOUNTER — Other Ambulatory Visit: Payer: Self-pay

## 2019-02-04 ENCOUNTER — Encounter (HOSPITAL_COMMUNITY): Payer: Self-pay | Admitting: *Deleted

## 2019-02-04 DIAGNOSIS — Z91013 Allergy to seafood: Secondary | ICD-10-CM | POA: Diagnosis not present

## 2019-02-04 DIAGNOSIS — F1290 Cannabis use, unspecified, uncomplicated: Secondary | ICD-10-CM | POA: Diagnosis present

## 2019-02-04 DIAGNOSIS — F200 Paranoid schizophrenia: Principal | ICD-10-CM | POA: Diagnosis present

## 2019-02-04 DIAGNOSIS — Z818 Family history of other mental and behavioral disorders: Secondary | ICD-10-CM

## 2019-02-04 DIAGNOSIS — F209 Schizophrenia, unspecified: Secondary | ICD-10-CM | POA: Diagnosis present

## 2019-02-04 DIAGNOSIS — F203 Undifferentiated schizophrenia: Secondary | ICD-10-CM | POA: Diagnosis not present

## 2019-02-04 DIAGNOSIS — F17210 Nicotine dependence, cigarettes, uncomplicated: Secondary | ICD-10-CM | POA: Diagnosis present

## 2019-02-04 DIAGNOSIS — G4700 Insomnia, unspecified: Secondary | ICD-10-CM | POA: Diagnosis present

## 2019-02-04 LAB — URINALYSIS, COMPLETE (UACMP) WITH MICROSCOPIC
Bilirubin Urine: NEGATIVE
Glucose, UA: NEGATIVE mg/dL
Hgb urine dipstick: NEGATIVE
Ketones, ur: 5 mg/dL — AB
Nitrite: POSITIVE — AB
Protein, ur: NEGATIVE mg/dL
Specific Gravity, Urine: 1.031 — ABNORMAL HIGH (ref 1.005–1.030)
pH: 6 (ref 5.0–8.0)

## 2019-02-04 MED ORDER — ALUM & MAG HYDROXIDE-SIMETH 200-200-20 MG/5ML PO SUSP: 30.0000 mL | ORAL | Status: DC | PRN

## 2019-02-04 MED ORDER — MAGNESIUM HYDROXIDE 400 MG/5ML PO SUSP: 30.0000 mL | Freq: Every day | ORAL | Status: DC | PRN

## 2019-02-04 MED ORDER — TRAZODONE HCL 50 MG PO TABS
50.0000 mg | ORAL_TABLET | Freq: Every evening | ORAL | Status: DC | PRN
  Administered 2019-02-04 – 2019-02-06 (×2): 50 mg via ORAL
  Filled 2019-02-04 (×10): qty 1

## 2019-02-04 MED ORDER — ACETAMINOPHEN 325 MG PO TABS: 650.0000 mg | ORAL_TABLET | Freq: Four times a day (QID) | ORAL | Status: DC | PRN

## 2019-02-04 MED ORDER — DIVALPROEX SODIUM 500 MG PO DR TAB
500.0000 mg | DELAYED_RELEASE_TABLET | Freq: Two times a day (BID) | ORAL | Status: DC
  Administered 2019-02-04 – 2019-02-07 (×7): 500 mg via ORAL
  Filled 2019-02-04 (×12): qty 1

## 2019-02-04 MED ORDER — ZIPRASIDONE MESYLATE 20 MG IM SOLR: 20.0000 mg | INTRAMUSCULAR | Status: DC | PRN

## 2019-02-04 MED ORDER — OLANZAPINE 5 MG PO TBDP: 5.0000 mg | ORAL_TABLET | Freq: Three times a day (TID) | ORAL | Status: DC | PRN

## 2019-02-04 MED ORDER — LORAZEPAM 1 MG PO TABS: 1.0000 mg | ORAL_TABLET | ORAL | Status: DC | PRN

## 2019-02-04 MED ORDER — ARIPIPRAZOLE 10 MG PO TABS
10.0000 mg | ORAL_TABLET | Freq: Every day | ORAL | Status: DC
  Administered 2019-02-04 – 2019-02-07 (×4): 10 mg via ORAL
  Filled 2019-02-04 (×7): qty 1

## 2019-02-04 MED ORDER — HYDROXYZINE HCL 25 MG PO TABS
25.0000 mg | ORAL_TABLET | Freq: Four times a day (QID) | ORAL | Status: DC | PRN
  Administered 2019-02-04: 21:00:00 25 mg via ORAL
  Filled 2019-02-04: qty 1

## 2019-02-04 NOTE — Plan of Care (Signed)
[facility] Crisis Plan  Reason for Crisis Plan:  Chronic Mental Illness/Medical Illness, Crisis Stabilization, Medication Management and Substance Abuse   Plan of Care:  Referral for Inpatient Hospitalization, Referral for IOP, Referral for Substance Abuse and Referral for Telepsychiatry/Psychiatric Consult  Family Support:      Current Living Environment:  Living Arrangements: Alone  Insurance:   Hospital Account    Name Acct ID Class Status Primary Coverage   Hiyab, Everard [id number] BEHAVIORAL HEALTH OBSERVATION [facility] FOR MH/DD/SAS - [place] [employer]        Guarantor Account (for Hospital Account [id number])    Name Relation to Pt Service Area Active? Acct Type   [name] Self [facility] Yes [facility]   Address Phone       [address] [place], [place] [address] [phone](H)          Coverage Information (for Hospital Account [id number])    F/O Payor/Plan Precert #   [facility] FOR MH/DD/SAS/[place] [employer]IP    Subscriber Subscriber #   Daeson, Tehrani [id number]   Address Phone   [address] [place], [place] [address] [phone]      Legal Guardian:     Primary Care Provider:  Patient, No Pcp Per  Current Outpatient Providers:  n/a  Psychiatrist:     Counselor/Therapist:     Compliant with Medications:  No  Additional Information:   Carlisle Cater 4/24/20202:57 AM

## 2019-02-04 NOTE — ED Notes (Signed)
[place] Police called from transport IVC pt to [facility] . Report called to [facility] RN

## 2019-02-04 NOTE — Progress Notes (Signed)
Samuel Martin attended wrap-up group. Pt appears flat/anxious in affect and mood. Pt denies SI/HI/VH/Pain at this time. Pt endorsing intermittent AH. No new c/o's. Pt was oriented to unit rules and procedures. Support offered. Will continue with POC.

## 2019-02-04 NOTE — BH Assessment (Addendum)
[facility] Assessment Progress Note   Per Juanetta Beets, DO, this pt requires psychiatric hospitalization at this time.  Berneice Heinrich, RN, [facility] has assigned pt to [facility] Rm [id number]; she will call when the Adult Unit is ready to receive pt.  Pt presents under IVC initiated by EDP Gerhard Munch, MD.  Pt's nurse, Jan, has been notified, as has Gwen.  Doylene Canning, [place] [facility] Coordinator [phone]

## 2019-02-04 NOTE — Progress Notes (Signed)
[place] NOVEL CORONAVIRUS (COVID-19) DAILY CHECK-OFF SYMPTOMS - answer yes or no to each - every day NO YES  Have you had a fever in the past 24 hours?  . Fever (Temp > 37.80C / 100F) X   Have you had any of these symptoms in the past 24 hours? . New Cough .  Sore Throat  .  Shortness of Breath .  Difficulty Breathing .  Unexplained Body Aches   X   Have you had any one of these symptoms in the past 24 hours not related to allergies?   . Runny Nose .  Nasal Congestion .  Sneezing   X   If you have had runny nose, nasal congestion, sneezing in the past 24 hours, has it worsened?  X   EXPOSURES - check yes or no X   Have you traveled outside the state in the past 14 days?  X   Have you been in contact with someone with a confirmed diagnosis of COVID-19 or PUI in the past 14 days without wearing appropriate PPE?  X   Have you been living in the same home as a person with confirmed diagnosis of COVID-19 or a PUI (household contact)?    X   Have you been diagnosed with COVID-19?    X              What to do next: Answered NO to all: Answered YES to anything:   Proceed with unit schedule Follow the [facility] Inpatient Flowsheet.   
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
## 2019-02-04 NOTE — [facility] Group Notes (Signed)
Adult Psychoeducational Group Note  Date:  02/04/2019 Time:  10:14 PM  Group Topic/Focus:  Wrap-Up Group:   The focus of this group is to help patients review their daily goal of treatment and discuss progress on daily workbooks.  Participation Level:  Active  Participation Quality:  Appropriate  Affect:  Appropriate  Cognitive:  Appropriate  Insight: Appropriate  Engagement in Group:  Engaged  Modes of Intervention:  Discussion and Education  Additional Comments:  Pt attended and participated in wrap up group this evening. Pt rated their day a 8/10. Pt goal while in the hospital is to find a way to believe in themselves more. Pt coping skill that is most helpful, is to listen to music.   Samuel Martin 02/04/2019, 10:14 PM

## 2019-02-04 NOTE — Tx Team (Signed)
Initial Treatment Plan 02/04/2019 4:24 PM [name] AXE:[id number]    PATIENT STRESSORS: Medication change or noncompliance Occupational concerns   PATIENT STRENGTHS: Ability for insight Motivation for treatment/growth   PATIENT IDENTIFIED PROBLEMS: "Stopped medications"  "hearing voices"                   DISCHARGE CRITERIA:  Ability to meet basic life and health needs Adequate post-discharge living arrangements Improved stabilization in mood, thinking, and/or behavior  PRELIMINARY DISCHARGE PLAN: Attend aftercare/continuing care group Attend PHP/IOP  PATIENT/FAMILY INVOLVEMENT: This treatment plan has been presented to and reviewed with the patient, Samuel Martin, and/or family member.  The patient and family have been given the opportunity to ask questions and make suggestions.  Layla Barter, RN 02/04/2019, 4:24 PM

## 2019-02-04 NOTE — Progress Notes (Signed)
Pt presents with a flat affect. Pt endorses AH and verbalized hearing random noises. Pt denies VH. Pt denies SI/HI. Pt denies feeling paranoid. Pt expressed the need to get back on his medications after stopping his medications last year. Pt was unable to recall the name of the medications he was prescribed last year. Pt thoughts are disorganized, speech tangential and pt noted to have thought blocking when conveying his thoughts. Pt reported using THC, occasional alcohol and no illicit drug use.

## 2019-02-04 NOTE — B[facility] Suicide Risk Assessment (Signed)
[facility] Admission Suicide Risk Assessment   Nursing information obtained from:  Patient Demographic factors:  Male, Low socioeconomic status Current Mental Status:  NA Loss Factors:  Financial problems / change in socioeconomic status Historical Factors:  NA Risk Reduction Factors:  Religious beliefs about death, Employed, Positive social support  Total Time spent with patient: 30 minutes Principal Problem: <principal problem not specified> Diagnosis:  Active Problems:   Schizophrenia (HCC)   Schizophrenia, paranoid type (HCC)  Subjective Data: Patient is seen and examined.  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  The patient was brought to the emergency department by his mother.  The patient stated that he had not taken medications for several months after he had been discharged from the hospital on 01/18/2018.  His examination at that time revealed rambling speech, tangential statements, and he was interacting inappropriately.  The patient was placed under involuntary commitment.  The patient stated that he had been standing in the front yard of his boardinghouse, had been smoking marijuana, and was talking to the voices in his head.  He stated the voices told him to "chill" and to "work out".  He had been hospitalized twice in 2019.  Both of these hospitalizations took place at [facility].  The patient was discharged on Abilify 20 mg p.o. daily, Depakote DR 500 mg p.o. twice daily as well as the Abilify long-acting injectable medication 400 mg IM q. 28 days.  He had also been given trazodone for sleep.  Again, he admitted that he had not followed up after he had been discharged to the hospital, and has not been on his medications for multiple  months.  He was admitted to the hospital for evaluation and stabilization.  Continued Clinical Symptoms:  Alcohol Use Disorder Identification Test Final Score (AUDIT): 1 The "Alcohol Use Disorders Identification Test", Guidelines for Use in Primary Care, Second Edition.  World Science writer [facility]). Score between 0-7:  no or low risk or alcohol related problems. Score between 8-15:  moderate risk of alcohol related problems. Score between 16-19:  high risk of alcohol related problems. Score 20 or above:  warrants further diagnostic evaluation for alcohol dependence and treatment.   CLINICAL FACTORS:   Bipolar Disorder:   Mixed State Schizophrenia:   Command hallucinatons   Musculoskeletal: Strength & Muscle Tone: within normal limits Gait & Station: normal Patient leans: N/A  Psychiatric Specialty Exam: Physical Exam  Nursing note and vitals reviewed. Constitutional: He is oriented to person, place, and time. He appears well-developed and well-nourished.  HENT:  Head: Normocephalic and atraumatic.  Respiratory: Effort normal.  Neurological: He is alert and oriented to person, place, and time.    ROS  Blood pressure 122/67, pulse (!) 59, temperature 97.7 F (36.5 C), temperature source Oral, resp. rate 18, height 5\' 10"  (1.778 m), weight 73.9 kg, SpO2 100 %.Body mass index is 23.39 kg/m.  General Appearance: Casual  Eye Contact:  Fair  Speech:  Normal Rate  Volume:  Decreased  Mood:  Dysphoric  Affect:  Flat  Thought Process:  Coherent and Descriptions of Associations: Circumstantial  Orientation:  Full (Time, Place, and Person)  Thought Content:  Delusions and Hallucinations: Auditory  Suicidal Thoughts:  No  Homicidal Thoughts:  No  Memory:  Immediate;   Fair Recent;  Fair Remote;   Fair  Judgement:  Intact  Insight:  Fair  Psychomotor Activity:  Decreased  Concentration:  Concentration: Fair and Attention Span: Fair  Recall:  [employer]Fair  Fund of Knowledge:  Fair   Language:  Fair  Akathisia:  Negative  Handed:  Right  AIMS (if indicated):     Assets:  Desire for Improvement Resilience  ADL's:  Intact  Cognition:  WNL  Sleep:         COGNITIVE FEATURES THAT CONTRIBUTE TO RISK:  None    SUICIDE RISK:   Mild:  Suicidal ideation of limited frequency, intensity, duration, and specificity.  There are no identifiable plans, no associated intent, mild dysphoria and related symptoms, good self-control (both objective and subjective assessment), few other risk factors, and identifiable protective factors, including available and accessible social support.  PLAN OF CARE: Patient is seen and examined.  Patient is a 23 year old male with a past psychiatric history significant for bipolar disorder with psychotic features versus schizoaffective disorder versus schizophrenia.  He presented to the [facility] emergency department with auditory hallucinations, inappropriate behaviors, disorganizational thinking.  He will be admitted to the hospital.  He will be integrated into the milieu.  He has already received Abilify and Depakote DR.  His examination today appears to be better than it had been when he was seen in the emergency department.  We will continue these medications as written.  His laboratories were reviewed and he had a mild elevation of his AST at 60, a mildly elevated total bilirubin at 1.4, a mild decrease in platelets 200,000, and drug screen positive for marijuana.  His urinalysis did show leukocyte positive esterase, rare bacteria and 21-50 white blood cells.  There was 0-5 squamous epithelial cells.  His creatinine was normal at 1.12.  We will repeat his urinalysis, and if necessary we will treat an infection.  I am also going to repeat his liver function enzymes in the a.m. given the fact that we have restarted his Depakote.  He is willing to receive the Abilify injection, but I want a watching for at least 24 hours before we go that route.  I  certify that inpatient services furnished can reasonably be expected to improve the patient's condition.   Antonieta PertGreg Lawson Jaycelyn Orrison, MD 02/04/2019, 3:34 PM

## 2019-02-04 NOTE — Progress Notes (Signed)
D:Pt is being transported to 500 hall when room is available. Pt has been calm and cooperative today. A:Offered support, encouragement and 15 minute checks. Medication given as ordered. R:Pt denies si and hi. He is tolerating medications well. Safety maintained on the unit.
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
## 2019-02-04 NOTE — Progress Notes (Signed)
Pt transferred to the adult unit and report given to [facility]. All items returned to adult unit staff.

## 2019-02-04 NOTE — Progress Notes (Signed)
Samuel Martin is a 23 year old male brought by [employer] under IVC after increasing bizarre behavior reported by his mother.  Pt. Has a history of schizophrenia with last hospitalization at [facility] in 01/2018.  Pt. Has not been compliant with any medication or plan recommendation including long acting injectables.  Pt. Minimally participates during assessment and is clearly upset with this current situation.  Pt. Was given a meal tray and expressed understanding of the potential plan as explained.  Pt. Went to sleep without incident and with safety maintained.

## 2019-02-05 LAB — CBC WITH DIFFERENTIAL/PLATELET
Abs Immature Granulocytes: 0.01 10*3/uL (ref 0.00–0.07)
Basophils Absolute: 0 10*3/uL (ref 0.0–0.1)
Basophils Relative: 1 %
Eosinophils Absolute: 0.1 10*3/uL (ref 0.0–0.5)
Eosinophils Relative: 3 %
HCT: 50.4 % (ref 39.0–52.0)
Hemoglobin: 15.9 g/dL (ref 13.0–17.0)
Immature Granulocytes: 0 %
Lymphocytes Relative: 55 %
Lymphs Abs: 2.1 10*3/uL (ref 0.7–4.0)
MCH: 29.9 pg (ref 26.0–34.0)
MCHC: 31.5 g/dL (ref 30.0–36.0)
MCV: 94.7 fL (ref 80.0–100.0)
Monocytes Absolute: 0.4 10*3/uL (ref 0.1–1.0)
Monocytes Relative: 11 %
Neutro Abs: 1.2 10*3/uL — ABNORMAL LOW (ref 1.7–7.7)
Neutrophils Relative %: 30 %
Platelets: 90 10*3/uL — ABNORMAL LOW (ref 150–400)
RBC: 5.32 MIL/uL (ref 4.22–5.81)
RDW: 13.7 % (ref 11.5–15.5)
WBC: 3.8 10*3/uL — ABNORMAL LOW (ref 4.0–10.5)
nRBC: 0 % (ref 0.0–0.2)

## 2019-02-05 LAB — URINALYSIS, ROUTINE W REFLEX MICROSCOPIC
Bilirubin Urine: NEGATIVE
Glucose, UA: NEGATIVE mg/dL
Hgb urine dipstick: NEGATIVE
Ketones, ur: 20 mg/dL — AB
Nitrite: POSITIVE — AB
Protein, ur: NEGATIVE mg/dL
Specific Gravity, Urine: 1.03 (ref 1.005–1.030)
pH: 6 (ref 5.0–8.0)

## 2019-02-05 LAB — HEPATIC FUNCTION PANEL
ALT: 30 U/L (ref 0–44)
AST: 36 U/L (ref 15–41)
Albumin: 4.4 g/dL (ref 3.5–5.0)
Alkaline Phosphatase: 76 U/L (ref 38–126)
Bilirubin, Direct: 0.2 mg/dL (ref 0.0–0.2)
Indirect Bilirubin: 0.7 mg/dL (ref 0.3–0.9)
Total Bilirubin: 0.9 mg/dL (ref 0.3–1.2)
Total Protein: 7.3 g/dL (ref 6.5–8.1)

## 2019-02-05 MED ORDER — SULFAMETHOXAZOLE-TRIMETHOPRIM 800-160 MG PO TABS
1.0000 | ORAL_TABLET | Freq: Two times a day (BID) | ORAL | Status: DC
  Administered 2019-02-05 – 2019-02-07 (×4): 1 via ORAL
  Filled 2019-02-05 (×9): qty 1

## 2019-02-05 MED ORDER — METRONIDAZOLE 500 MG PO TABS
2000.0000 mg | ORAL_TABLET | Freq: Once | ORAL | Status: AC
  Administered 2019-02-05: 15:00:00 2000 mg via ORAL
  Filled 2019-02-05: qty 4
  Filled 2019-02-05: qty 8

## 2019-02-05 MED ORDER — SULFAMETHOXAZOLE-TRIMETHOPRIM 800-160 MG PO TABS: 1.0000 | ORAL_TABLET | Freq: Two times a day (BID) | ORAL | Status: DC

## 2019-02-05 NOTE — Progress Notes (Signed)
   02/05/19 0756  COVID-19 Daily Checkoff  Have you had a fever (temp > 37.80C/100F)  in the past 24 hours?  No  If you have had runny nose, nasal congestion, sneezing in the past 24 hours, has it worsened? No  COVID-19 EXPOSURE  Have you traveled outside the state in the past 14 days? No  Have you been in contact with someone with a confirmed diagnosis of COVID-19 or PUI in the past 14 days without wearing appropriate PPE? No  Have you been living in the same home as a person with confirmed diagnosis of COVID-19 or a PUI (household contact)? No  Have you been diagnosed with COVID-19? No

## 2019-02-05 NOTE — Progress Notes (Signed)
[facility] Group Notes:  (Nursing/MHT/Case Management/Adjunct)  Date:  02/05/2019  Time:  6:28 PM  Type of Therapy:  Nurse Education  Participation Level:  Active  Participation Quality:  Appropriate and Attentive  Affect:  Appropriate  Cognitive:  Alert and Appropriate  Insight:  Good  Engagement in Group:  Engaged  Modes of Intervention:  Activity and Clarification  Summary of Progress/Problems: In this group, patients learned about anger management. They were taught coping skills, such as progressive muscle relaxation, paired muscle relaxation, and paced breathing. They were also taught communication with "I feel" statements to communicate their needs. The patient was active and engaged in group.   Ardel Jagger C Madge Therrien 02/05/2019, 6:28 PM 

## 2019-02-05 NOTE — Plan of Care (Signed)
D: Patient presents calm and cooperative. He reports sleeping well last night, and received medication that was helpful. His appetite is good, energy normal and concentration good. He rates his depression, hopelessness and anxiety 0/10. He denies withdrawal symptoms or physical complaints. Patient denies SI/HI/AVH. Goal: "to get myself out of here and back to my life. Do everything the nurse and doctor ask." A: Patient checked q15 min, and checks reviewed. Reviewed medication changes with patient and educated on side effects. Educated patient on importance of attending group therapy sessions and educated on several coping skills. Encouarged participation in milieu through recreation therapy and attending meals with peers. Support and encouragement provided. Fluids offered. R: Patient receptive to education on medications, and is medication compliant. Patient contracts for safety on the unit.  Problem: Education: Goal: Emotional status will improve Outcome: Progressing Goal: Mental status will improve Outcome: Progressing   Problem: Activity: Goal: Interest or engagement in activities will improve Outcome: Progressing Goal: Sleeping patterns will improve Outcome: Progressing

## 2019-02-05 NOTE — [facility] Suicide Risk Assessment (Signed)
[facility] INPATIENT:  Family/Significant Other Suicide Prevention Education  Suicide Prevention Education:  Contact Attempts: mother Andrewjohn Toller [phone], (name of family member/significant other) has been identified by the patient as the family member/significant other with whom the patient will be residing, and identified as the person(s) who will aid the patient in the event of a mental health crisis.  With written consent from the patient, two attempts were made to provide suicide prevention education, prior to and/or following the patient's discharge.  We were unsuccessful in providing suicide prevention education.  A suicide education pamphlet was given to the patient to share with family/significant other.  Date and time of first attempt:  02/05/2019  /  2:49 PM  - HIPAA-compliant voicemail left Date and time of second attempt:  CSW to follow up   Ambrose Mantle, LCSW 02/05/2019, 2:50 PM

## 2019-02-05 NOTE — [facility] Group Notes (Signed)
Adult Psychoeducational Group Note  Date:  02/05/2019 Time:  10:08 PM  Group Topic/Focus:  Wrap-Up Group:   The focus of this group is to help patients review their daily goal of treatment and discuss progress on daily workbooks.  Participation Level:  Active  Participation Quality:  Appropriate  Affect:  Appropriate  Cognitive:  Appropriate  Insight: Appropriate  Engagement in Group:  Engaged  Modes of Intervention:  Discussion and Education  Additional Comments:  Pt attended and participated in wrap up group this evening. Pt rated their day an 8/10. Pt goal is to learn to focus on their anger in another way. Pt wants to find multiple coping skills.   Chrisandra Netters 02/05/2019, 10:08 PM

## 2019-02-05 NOTE — [facility] Group Notes (Signed)
LCSW Group Therapy Note  02/05/2019    10:00-11:00am   Type of Therapy and Topic:  Group Therapy: Early Messages Received About Anger  Participation Level:  Minimal   Description of Group:   In this group, patients shared and discussed the early messages received in their lives about anger through parental or other adult modeling, teaching, repression, punishment, violence, and more.  Participants identified how those childhood lessons influence even now how they usually or often react when angered.  The group discussed that anger is a secondary emotion and what may be the underlying emotional themes that come out through anger outbursts or that are ignored through anger suppression.  Finally, as a group there was a conversation about the workbook's quote that "There is nothing wrong with anger; it is just a sign something needs to change."     Therapeutic Goals: 1. Patients will identify one or more childhood message about anger that they received and how it was taught to them. 2. Patients will discuss how these childhood experiences have influenced and continue to influence their own expression or repression of anger even today. 3. Patients will explore possible primary emotions that tend to fuel their secondary emotion of anger. 4. Patients will learn that anger itself is normal and cannot be eliminated, and that healthier coping skills can assist with resolving conflict rather than worsening situations.  Summary of Patient Progress:  The patient shared that his childhood lessons about anger were "there's a right way to express yourself."  As a result, he knows that there are some techniques for managing his anger that work for himself such as a stress ball, listening to music, and screaming into a pillow.  He slept for much of group, was gone for part of a group, and engaged relatively little.  Therapeutic Modalities:   Cognitive Behavioral Therapy Motivation Interviewing  Lynnell Chad  .
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
## 2019-02-06 LAB — HIV ANTIBODY (ROUTINE TESTING W REFLEX): HIV Screen 4th Generation wRfx: NONREACTIVE

## 2019-02-06 NOTE — Progress Notes (Signed)
Writer has observed patient up in the dayroom watching tv. He is pleasant and calm tonight. He reports feeling better and was compliant with his medications. He reports talking to his mother today. He is hopeful to discharge soon. Support given and safety maintained on unit with 15 min checks.

## 2019-02-06 NOTE — Plan of Care (Signed)
D: Patient presents calm and cooperative. Patient denies SI/HI/AVH. He slept well last night and declined medication. His appetite is good, energy high and concentration good. He rates his depression, hopelessness, and anxiety 0/10. He denies physical symptoms or withdrawal complaints. Goal: "My discharge." A: Patient checked q15 min, and checks reviewed. Reviewed medication changes with patient and educated on side effects. Educated patient on importance of attending group therapy sessions and educated on several coping skills. Encouarged participation in milieu through recreation therapy and attending meals with peers. Support and encouragement provided. Fluids offered. R: Patient receptive to education on medications, and is medication compliant. Patient contracts for safety on the unit.  Problem: Education: Goal: Emotional status will improve Outcome: Not Progressing Goal: Mental status will improve Outcome: Not Progressing   Problem: Activity: Goal: Interest or engagement in activities will improve Outcome: Not Progressing Goal: Sleeping patterns will improve Outcome: Not Progressing

## 2019-02-06 NOTE — Progress Notes (Signed)
   02/06/19 0826  COVID-19 Daily Checkoff  Have you had a fever (temp > 37.80C/100F)  in the past 24 hours?  No  If you have had runny nose, nasal congestion, sneezing in the past 24 hours, has it worsened? No  COVID-19 EXPOSURE  Have you traveled outside the state in the past 14 days? No  Have you been in contact with someone with a confirmed diagnosis of COVID-19 or PUI in the past 14 days without wearing appropriate PPE? No  Have you been living in the same home as a person with confirmed diagnosis of COVID-19 or a PUI (household contact)? No  Have you been diagnosed with COVID-19? No

## 2019-02-06 NOTE — [facility] Suicide Risk Assessment (Signed)
[facility] INPATIENT:  Family/Significant Other Suicide Prevention Education  Suicide Prevention Education:  Education Completed; mother Vasiliy Crear [phone],  (name of family member/significant other) has been identified by the patient as the family member/significant other with whom the patient will be residing, and identified as the person(s) who will aid the patient in the event of a mental health crisis (suicidal ideations/suicide attempt).  With written consent from the patient, the family member/significant other has been provided the following suicide prevention education, prior to the and/or following the discharge of the patient.  Mother states that in the past, it has at times been difficult for patient to get his medications refilled at [facility] because they do not carry those medicines.  CSW looked up his medicines, and mother confirmed she was familiar with Depakote and Trazodone and knows he can get those filled.  The third medicine, Abilify (or the generic) may be more difficult to get due to cost.  He used to have Medicaid but does not have it now apparently.  CSW looked up and printed Good Rx coupons for [facility] and put on his chart to be given to him/his mother at discharge.   At this point safety information was reviewed.  The suicide prevention education provided includes the following:  Suicide risk factors  Suicide prevention and interventions  National Suicide Hotline telephone number  [facility] assessment telephone number  [facility] Emergency Assistance 911  [facility] and/or Residential Mobile Crisis Unit telephone number  Request made of family/significant other to:  Remove weapons (e.g., guns, rifles, knives), all items previously/currently identified as safety concern.    Remove drugs/medications (over-the-counter, prescriptions, illicit drugs), all items previously/currently identified as a safety concern.  The family  member/significant other verbalizes understanding of the suicide prevention education information provided.  The family member/significant other agrees to remove the items of safety concern listed above.  Samuel Martin 02/06/2019, 1:44 PM
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
## 2019-02-06 NOTE — [facility] Group Notes (Signed)
[facility] LCSW Group Therapy Note  02/06/2019   10:00-11:00AM  Type of Therapy and Topic:  Group Therapy:  Unhealthy versus Healthy Supports, Which Am I?  Participation Level:  Active   Description of Group:  Patients in this group were introduced to the concept that additional supports including self-support are an essential part of recovery.  Initially a discussion was held about the differences between healthy versus unhealthy supports.  Patients were asked to share what unhealthy supports in their lives need to be addressed, as well as what additional healthy supports could be added for greater help in reaching their goals.   A song entitled "My Own Hero" was played and a group discussion ensued in which patients stated they could relate to the song and it inspired them to realize they have be willing to help themselves in order to succeed, because other people cannot achieve sobriety or stability for them.  We discussed adding a variety of healthy supports to address the various needs in patient lives, including becoming more self-supportive.   Therapeutic Goals: 1)  Highlight the differences between healthy and unhealthy supports 2)  Suggest the importance of being a part of one's own support system 2)  Discuss reasons people in one's life may eventually be unable to be continually supportive  3)  Identify the patient's current support system   4) Elicit commitments to add healthy supports and to become more conscious of being self-supportive   Summary of Patient Progress:  The patient listed as healthy supports the following:  His mother, grandmother and siblings.  The patient expressed that unhealthy support(s) to be addressed include(s) being a better self-support.  Healthy supports which could be added for increased stability and happiness include therapy, and possibly a doctor.  Therapeutic Modalities:   Motivational Interviewing Activity  Lynnell Chad , MSW, LCSW
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
## 2019-02-07 MED ORDER — TRAZODONE HCL 50 MG PO TABS: 50.0000 mg | ORAL_TABLET | Freq: Every evening | ORAL | 0 refills | Status: DC | PRN

## 2019-02-07 MED ORDER — SULFAMETHOXAZOLE-TRIMETHOPRIM 800-160 MG PO TABS: 1.0000 | ORAL_TABLET | Freq: Two times a day (BID) | ORAL | 0 refills | Status: DC

## 2019-02-07 MED ORDER — DIVALPROEX SODIUM 500 MG PO DR TAB: 500.0000 mg | DELAYED_RELEASE_TABLET | Freq: Two times a day (BID) | ORAL | 0 refills | Status: DC

## 2019-02-07 MED ORDER — ARIPIPRAZOLE 10 MG PO TABS: 10.0000 mg | ORAL_TABLET | Freq: Every day | ORAL | 0 refills | Status: DC

## 2019-02-07 MED ORDER — HYDROXYZINE HCL 25 MG PO TABS: 25.0000 mg | ORAL_TABLET | Freq: Four times a day (QID) | ORAL | 0 refills | Status: DC | PRN

## 2019-02-07 NOTE — Tx Team (Signed)
Interdisciplinary Treatment and Diagnostic Plan Update  02/07/2019 Time of Session:  Kingsten Bayani MRN: [id number]  Principal Diagnosis: Schizophrenia [facility])  Secondary Diagnoses: Principal Problem:   Schizophrenia (HCC) Active Problems:   Schizophrenia, paranoid type (HCC)   Current Medications:  Current Facility-Administered Medications  Medication Dose Route Frequency Provider Last Rate Last Dose  . acetaminophen (TYLENOL) tablet 650 mg  650 mg Oral Q6H PRN Laveda Abbe, NP      . alum & mag hydroxide-simeth (MAALOX/MYLANTA) 200-200-20 MG/5ML suspension 30 mL  30 mL Oral Q4H PRN Laveda Abbe, NP      . ARIPiprazole (ABILIFY) tablet 10 mg  10 mg Oral Daily Laveda Abbe, NP   10 mg at 02/07/19 3474  . divalproex (DEPAKOTE) DR tablet 500 mg  500 mg Oral Q12H Laveda Abbe, NP   500 mg at 02/07/19 2595  . hydrOXYzine (ATARAX/VISTARIL) tablet 25 mg  25 mg Oral Q6H PRN Kerry Hough, PA-C   25 mg at 02/04/19 2128  . OLANZapine zydis (ZYPREXA) disintegrating tablet 5 mg  5 mg Oral Q8H PRN Kerry Hough, PA-C       And  . LORazepam (ATIVAN) tablet 1 mg  1 mg Oral PRN Donell Sievert E, PA-C       And  . ziprasidone (GEODON) injection 20 mg  20 mg Intramuscular PRN Donell Sievert E, PA-C      . magnesium hydroxide (MILK OF MAGNESIA) suspension 30 mL  30 mL Oral Daily PRN Laveda Abbe, NP      . sulfamethoxazole-trimethoprim (BACTRIM DS) 800-160 MG per tablet 1 tablet  1 tablet Oral Q12H Oneta Rack, NP   1 tablet at 02/07/19 [phone]  . traZODone (DESYREL) tablet 50 mg  50 mg Oral QHS,MR X 1 Kerry Hough, PA-C   50 mg at 02/06/19 2105   PTA Medications: Medications Prior to Admission  Medication Sig Dispense Refill Last Dose  . acetaminophen (TYLENOL) 325 MG tablet Take 650 mg by mouth every 6 (six) hours as needed for mild pain or headache.     . ibuprofen (ADVIL) 400 MG tablet Take 400 mg by mouth every 6 (six) hours as needed for  headache or mild pain.     . ARIPiprazole (ABILIFY) 20 MG tablet Take 1 tablet (20 mg total) by mouth daily. For mood stabilization 15 tablet 0   . ARIPiprazole ER (ABILIFY MAINTENA) 400 MG SRER injection Inject 2 mLs (400 mg total) into the muscle every 28 (twenty-eight) days. (Due 02-21-18): For mood control 1 each 0   . cyanocobalamin 1000 MCG tablet Take 1 tablet (1,000 mcg total) by mouth daily. For Vitamin B-12 deficiency 30 tablet 0   . divalproex (DEPAKOTE) 500 MG DR tablet Take 1 tablet (500 mg total) by mouth every 12 (twelve) hours. For mood stabilization 60 tablet 0   . hydrOXYzine (ATARAX/VISTARIL) 25 MG tablet Take 1 tablet (25 mg total) by mouth 3 (three) times daily as needed (mild/moderate anxiety). 75 tablet 0   . nicotine (NICODERM CQ - DOSED IN MG/24 HOURS) 21 mg/24hr patch Place 1 patch (21 mg total) onto the skin daily. (May purchase from over the counter): For smoking cessation 28 patch 0   . traZODone (DESYREL) 50 MG tablet Take 1 tablet (50 mg total) by mouth at bedtime as needed for sleep. 30 tablet 1     Patient Stressors: Medication change or noncompliance Occupational concerns  Patient Strengths: Ability for insight Motivation for treatment/growth  Treatment Modalities: Medication Management, Group therapy, Case management,  1 to 1 session with clinician, Psychoeducation, Recreational therapy.   Physician Treatment Plan for Primary Diagnosis: Schizophrenia (HCC) Long Term Goal(s): Improvement in symptoms so as ready for discharge Improvement in symptoms so as ready for discharge   Short Term Goals: Ability to identify changes in lifestyle to reduce recurrence of condition will improve Ability to verbalize feelings will improve Ability to disclose and discuss suicidal ideas Ability to demonstrate self-control will improve Ability to identify and develop effective coping behaviors will improve Ability to maintain clinical measurements within normal limits will  improve Compliance with prescribed medications will improve Ability to identify triggers associated with substance abuse/mental health issues will improve Ability to identify changes in lifestyle to reduce recurrence of condition will improve Ability to verbalize feelings will improve Ability to disclose and discuss suicidal ideas Ability to demonstrate self-control will improve Ability to identify and develop effective coping behaviors will improve Ability to maintain clinical measurements within normal limits will improve Compliance with prescribed medications will improve Ability to identify triggers associated with substance abuse/mental health issues will improve  Medication Management: Evaluate patient's response, side effects, and tolerance of medication regimen.  Therapeutic Interventions: 1 to 1 sessions, Unit Group sessions and Medication administration.  Evaluation of Outcomes: Adequate for Discharge  Physician Treatment Plan for Secondary Diagnosis: Principal Problem:   Schizophrenia (HCC) Active Problems:   Schizophrenia, paranoid type (HCC)  Long Term Goal(s): Improvement in symptoms so as ready for discharge Improvement in symptoms so as ready for discharge   Short Term Goals: Ability to identify changes in lifestyle to reduce recurrence of condition will improve Ability to verbalize feelings will improve Ability to disclose and discuss suicidal ideas Ability to demonstrate self-control will improve Ability to identify and develop effective coping behaviors will improve Ability to maintain clinical measurements within normal limits will improve Compliance with prescribed medications will improve Ability to identify triggers associated with substance abuse/mental health issues will improve Ability to identify changes in lifestyle to reduce recurrence of condition will improve Ability to verbalize feelings will improve Ability to disclose and discuss suicidal  ideas Ability to demonstrate self-control will improve Ability to identify and develop effective coping behaviors will improve Ability to maintain clinical measurements within normal limits will improve Compliance with prescribed medications will improve Ability to identify triggers associated with substance abuse/mental health issues will improve     Medication Management: Evaluate patient's response, side effects, and tolerance of medication regimen.  Therapeutic Interventions: 1 to 1 sessions, Unit Group sessions and Medication administration.  Evaluation of Outcomes: Adequate for Discharge   RN Treatment Plan for Primary Diagnosis: Schizophrenia (HCC) Long Term Goal(s): Knowledge of disease and therapeutic regimen to maintain health will improve  Short Term Goals: Ability to participate in decision making will improve, Ability to verbalize feelings will improve, Ability to disclose and discuss suicidal ideas, Ability to identify and develop effective coping behaviors will improve and Compliance with prescribed medications will improve  Medication Management: RN will administer medications as ordered by provider, will assess and evaluate patient's response and provide education to patient for prescribed medication. RN will report any adverse and/or side effects to prescribing provider.  Therapeutic Interventions: 1 on 1 counseling sessions, Psychoeducation, Medication administration, Evaluate responses to treatment, Monitor vital signs and CBGs as ordered, Perform/monitor CIWA, COWS, AIMS and Fall Risk screenings as ordered, Perform wound care treatments as ordered.  Evaluation of Outcomes: Adequate for Discharge   LCSW Treatment  Plan for Primary Diagnosis: Schizophrenia [facility](HCC) Long Term Goal(s): Safe transition to appropriate next level of care at discharge, Engage patient in therapeutic group addressing interpersonal concerns.  Short Term Goals: Engage patient in aftercare planning  with referrals and resources  Therapeutic Interventions: Assess for all discharge needs, 1 to 1 time with Social worker, Explore available resources and support systems, Assess for adequacy in community support network, Educate family and significant other(s) on suicide prevention, Complete Psychosocial Assessment, Interpersonal group therapy.  Evaluation of Outcomes: Adequate for Discharge   Progress in Treatment: Attending groups: Yes. Participating in groups: Yes. Taking medication as prescribed: Yes. Toleration medication: Yes. Family/Significant other contact made: Yes, individual(s) contacted:  the patient's mother  Patient understands diagnosis: Yes. Discussing patient identified problems/goals with staff: Yes. Medical problems stabilized or resolved: Yes. Denies suicidal/homicidal ideation: Yes. Issues/concerns per patient self-inventory: No. Other:   New problem(s) identified: None   New Short Term/Long Term Goal(s):medication stabilization, elimination of SI thoughts, development of comprehensive mental wellness plan.    Patient Goals:    Discharge Plan or Barriers: Patient is discharging home to a local boarding house. He plans to continue to follow up with [facility] for outpatient medication management and therapy services.   Reason for Continuation of Hospitalization: None   Estimated Length of Stay: Discharging, 02/07/2019  Attendees: Patient: 02/07/2019 8:56 AM  Physician: Dr. Landry MellowGreg Clary, MD 02/07/2019 8:56 AM  Nursing: Sedalia Mutaiane.Leonard SchwartzB, RN 02/07/2019 8:56 AM  RN Care Manager: 02/07/2019 8:56 AM  Social Worker: Baldo DaubJolan Joye Wesenberg, LCSWA 02/07/2019 8:56 AM  Recreational Therapist:  02/07/2019 8:56 AM  Other:  02/07/2019 8:56 AM  Other:  02/07/2019 8:56 AM  Other: 02/07/2019 8:56 AM    Scribe for Treatment Team: Maeola SarahJolan E Shameka Aggarwal, LCSWA 02/07/2019 8:56 AM

## 2019-02-07 NOTE — Progress Notes (Signed)
Adult Psychoeducational Group Note  Date:  02/07/2019 Time:  10:52 AM  Group Topic/Focus:  Developing a Wellness Toolbox:   The focus of this group is to help patients develop a "wellness toolbox" with skills and strategies to promote recovery upon discharge.  Participation Level:  Active  Participation Quality:  Appropriate  Affect:  Appropriate  Cognitive:  Appropriate  Insight: Appropriate  Engagement in Group:  Engaged  Modes of Intervention:  Discussion and Education  Additional Comments:  Pt was present during the group session and stated that " I am happy to be going home today."  Brenetta Penny E 02/07/2019, 10:52 AM

## 2019-02-07 NOTE — Progress Notes (Signed)
Pt reports readiness. He is so anxious to go that he has called his mother to pick him up. Denies SI/HI/AVH.

## 2019-02-07 NOTE — Progress Notes (Signed)
Recreation Therapy Notes  Date:  4.27.20 Time: 0930 Location: 300 Hall Dayroom  Group Topic: Stress Management  Goal Area(s) Addresses:  Patient will identify positive stress management techniques. Patient will identify benefits of using stress management post d/c.  Intervention: Stress Management  Activity :  Meditation.  LRT introduced the stress management technique of meditation.  LRT played a meditation that dealt with being resilient in the face of adversity.  Patients were to listen and follow along as meditation played.  Education:  Stress Management, Discharge Planning.   Education Outcome: Acknowledges Education  Clinical Observations/Feedback:  Pt did not attend group.     Caroll Rancher, LRT/CTRS         Lillia Abed, Shyah Cadmus A 02/07/2019 11:09 AM

## 2019-02-07 NOTE — Progress Notes (Signed)
Pt discharged and his mother was here to pick him up.
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
## 2019-02-07 NOTE — BHH Suicide Risk Assessment (Signed)
[facility] Discharge Suicide Risk Assessment   Principal Problem: Schizophrenia [facility]) Discharge Diagnoses: Principal Problem:   Schizophrenia (HCC) Active Problems:   Schizophrenia, paranoid type (HCC)   Total Time spent with patient: 15 minutes  Musculoskeletal: Strength & Muscle Tone: within normal limits Gait & Station: normal Patient leans: N/A  Psychiatric Specialty Exam: Review of Systems  All other systems reviewed and are negative.   Blood pressure (!) 137/92, pulse 69, temperature 98.2 F (36.8 C), temperature source Oral, resp. rate 16, height 5\' 10"  (1.778 m), weight 73.9 kg, SpO2 100 %.Body mass index is 23.39 kg/m.  General Appearance: Casual  Eye Contact::  Good  Speech:  Normal Rate409  Volume:  Normal  Mood:  Euthymic  Affect:  Congruent  Thought Process:  Coherent and Descriptions of Associations: Intact  Orientation:  Full (Time, Place, and Person)  Thought Content:  Logical  Suicidal Thoughts:  No  Homicidal Thoughts:  No  Memory:  Immediate;   Fair Recent;   Fair Remote;   Fair  Judgement:  Intact  Insight:  Fair  Psychomotor Activity:  Normal  Concentration:  Fair  Recall:  [employer] of Knowledge:Fair  Language: Fair  Akathisia:  Negative  Handed:  Right  AIMS (if indicated):     Assets:  Desire for Improvement Resilience  Sleep:  Number of Hours: 5.75  Cognition: WNL  ADL's:  Intact   Mental Status Per Nursing Assessment::   On Admission:  NA  Demographic Factors:  Male, Adolescent or young adult and Low socioeconomic status  Loss Factors: NA  Historical Factors: Impulsivity  Risk Reduction Factors:   Positive coping skills or problem solving skills  Continued Clinical Symptoms:  Alcohol/Substance Abuse/Dependencies Schizophrenia:   Paranoid or undifferentiated type  Cognitive Features That Contribute To Risk:  None    Suicide Risk:  Minimal: No identifiable suicidal ideation.  Patients presenting with no risk factors but  with morbid ruminations; may be classified as minimal risk based on the severity of the depressive symptoms    Plan Of Care/Follow-up recommendations:  Activity:  ad lib  Antonieta Pert, MD 02/07/2019, 8:15 AM

## 2019-02-07 NOTE — Progress Notes (Signed)
Pt completed self inventory and indicates that he slept good and has good appetite. His energy level is good. Denies feeling depression, hopelessness or anxiety. Denies SI/HI/AV. Pt is cooperative. Goal today is to work on discharge and plans to talk with the doctor about the discharge.

## 2019-02-07 NOTE — Progress Notes (Signed)
  [facility] Adult Case Management Discharge Plan :  Will you be returning to the same living situation after discharge:  Yes,  patient reports he is returning to a boarding home  At discharge, do you have transportation home?: Yes,  bus pass Do you have the ability to pay for your medications: No.  Release of information consent forms completed and in the chart;  Patient's signature needed at discharge.  Patient to Follow up at: Follow-up Information    [facility] Follow up on 02/09/2019.   Why:  Hospital follow up appointment is Wednesday, 4/29 at 2:30p.  The appointment will be held over the telephone and the provider will contact you.  Contact information: [address] [place] [address] [phone]           Next level of care provider has access to [facility] Link:yes  Safety Planning and Suicide Prevention discussed: Yes,  with the patient's mother   Have you used any form of tobacco in the last 30 days? (Cigarettes, Smokeless Tobacco, Cigars, and/or Pipes): Patient Refused Screening  Has patient been referred to the Quitline?: N/A patient is not a smoker  Patient has been referred for addiction treatment: N/A  Maeola Sarah, LCSWA 02/07/2019, 10:57 AM

## 2019-05-30 ENCOUNTER — Ambulatory Visit (HOSPITAL_COMMUNITY)
Admission: EM | Admit: 2019-05-30 | Discharge: 2019-05-30 | Disposition: A | Discharge status: Home / Self Care | Payer: Medicaid Other | Attending: Family Medicine | Admitting: Family Medicine

## 2019-05-30 ENCOUNTER — Encounter (HOSPITAL_COMMUNITY): Payer: Self-pay | Admitting: Emergency Medicine

## 2019-05-30 ENCOUNTER — Other Ambulatory Visit: Payer: Self-pay

## 2019-05-30 DIAGNOSIS — R0981 Nasal congestion: Secondary | ICD-10-CM | POA: Insufficient documentation

## 2019-05-30 DIAGNOSIS — J029 Acute pharyngitis, unspecified: Secondary | ICD-10-CM | POA: Insufficient documentation

## 2019-05-30 DIAGNOSIS — Z1159 Encounter for screening for other viral diseases: Secondary | ICD-10-CM

## 2019-05-30 DIAGNOSIS — Z20828 Contact with and (suspected) exposure to other viral communicable diseases: Secondary | ICD-10-CM | POA: Diagnosis not present

## 2019-05-30 NOTE — Discharge Instructions (Addendum)
You may use over the counter ibuprofen or acetaminophen as needed.  °For a sore throat, over the counter products such as [employer] Peroxyl Mouth Sore Rinse or Chloraseptic Sore Throat Spray may provide some temporary relief. ° ° ° ° °
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
## 2019-05-30 NOTE — ED Triage Notes (Signed)
Sore throat started 2 days ago.  Patient also has runny nose and coughing

## 2019-06-01 LAB — NOVEL CORONAVIRUS, NAA (HOSP ORDER, SEND-OUT TO REF LAB; TAT 18-24 HRS): SARS-CoV-2, NAA: NOT DETECTED

## 2019-06-13 ENCOUNTER — Encounter (HOSPITAL_COMMUNITY): Payer: Self-pay | Admitting: Emergency Medicine

## 2019-06-13 ENCOUNTER — Ambulatory Visit (HOSPITAL_COMMUNITY)
Admission: EM | Admit: 2019-06-13 | Discharge: 2019-06-13 | Disposition: A | Discharge status: Home / Self Care | Payer: Medicaid Other | Attending: Family Medicine | Admitting: Family Medicine

## 2019-06-13 ENCOUNTER — Other Ambulatory Visit: Payer: Self-pay

## 2019-06-13 DIAGNOSIS — Z202 Contact with and (suspected) exposure to infections with a predominantly sexual mode of transmission: Secondary | ICD-10-CM | POA: Diagnosis not present

## 2019-06-13 NOTE — Discharge Instructions (Signed)
You will be notified if any of your tests are positive It is important that we have an accurate phone number to reach you

## 2019-06-13 NOTE — ED Triage Notes (Signed)
Pt here for STD screening. 
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
## 2019-06-14 LAB — RPR: RPR Ser Ql: NONREACTIVE

## 2019-06-14 LAB — HIV ANTIBODY (ROUTINE TESTING W REFLEX): HIV Screen 4th Generation wRfx: NONREACTIVE

## 2019-06-15 LAB — CYTOLOGY, (ORAL, ANAL, URETHRAL) ANCILLARY ONLY
Chlamydia: NEGATIVE
Neisseria Gonorrhea: NEGATIVE
Trichomonas: NEGATIVE

## 2019-08-28 ENCOUNTER — Encounter (HOSPITAL_COMMUNITY): Payer: Self-pay

## 2019-08-28 ENCOUNTER — Other Ambulatory Visit: Payer: Self-pay

## 2019-08-28 ENCOUNTER — Emergency Department (HOSPITAL_COMMUNITY)
Admission: EM | Admit: 2019-08-28 | Discharge: 2019-08-29 | Disposition: A | Discharge status: Home / Self Care | Payer: Medicaid Other | Attending: Emergency Medicine | Admitting: Emergency Medicine

## 2019-08-28 DIAGNOSIS — F17210 Nicotine dependence, cigarettes, uncomplicated: Secondary | ICD-10-CM | POA: Insufficient documentation

## 2019-08-28 DIAGNOSIS — F1910 Other psychoactive substance abuse, uncomplicated: Secondary | ICD-10-CM

## 2019-08-28 DIAGNOSIS — Z20828 Contact with and (suspected) exposure to other viral communicable diseases: Secondary | ICD-10-CM | POA: Insufficient documentation

## 2019-08-28 DIAGNOSIS — F209 Schizophrenia, unspecified: Secondary | ICD-10-CM | POA: Insufficient documentation

## 2019-08-28 DIAGNOSIS — F12951 Cannabis use, unspecified with psychotic disorder with hallucinations: Secondary | ICD-10-CM | POA: Diagnosis present

## 2019-08-28 DIAGNOSIS — F200 Paranoid schizophrenia: Secondary | ICD-10-CM | POA: Diagnosis present

## 2019-08-28 DIAGNOSIS — F1220 Cannabis dependence, uncomplicated: Secondary | ICD-10-CM | POA: Diagnosis present

## 2019-08-28 DIAGNOSIS — R4689 Other symptoms and signs involving appearance and behavior: Secondary | ICD-10-CM | POA: Insufficient documentation

## 2019-08-28 MED ORDER — STERILE WATER FOR INJECTION IJ SOLN
INTRAMUSCULAR | Status: AC
  Administered 2019-08-28: 1.2 mL
  Filled 2019-08-28: qty 10

## 2019-08-28 MED ORDER — ZIPRASIDONE MESYLATE 20 MG IM SOLR
20.0000 mg | Freq: Once | INTRAMUSCULAR | Status: AC
  Administered 2019-08-28: 20 mg via INTRAMUSCULAR
  Filled 2019-08-28: qty 20

## 2019-08-28 NOTE — ED Notes (Signed)
Patient arguing with mother stating he just wants to go home, continues to walk around the hallways stating he just wants to go get something to eat. Patient redirected back to the room.
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
## 2019-08-28 NOTE — ED Triage Notes (Addendum)
Pt arrived with family stating that he did some form of drug but unsure of what. Patent laughing in triage and redirectable but will not focus on answering any questions at this time. Denies any SI or HI. Family states she brought him here in the past for same and were able to give him something to calm down. Patient states acid, but then states he is kidding.

## 2019-08-28 NOTE — ED Notes (Signed)
Pt attempting to leave multiple times. Dr.Cardama notified and order for restraints given.

## 2019-08-28 NOTE — ED Notes (Addendum)
Pt and pt's mother are now out in the lobby with the mother trying to get the patient to come back inside. Pt's mother stated that the patient said he took spice, a synthetic weed.

## 2019-08-29 ENCOUNTER — Encounter (HOSPITAL_COMMUNITY): Payer: Self-pay | Admitting: Registered Nurse

## 2019-08-29 DIAGNOSIS — F12251 Cannabis dependence with psychotic disorder with hallucinations: Secondary | ICD-10-CM

## 2019-08-29 DIAGNOSIS — F200 Paranoid schizophrenia: Secondary | ICD-10-CM

## 2019-08-29 LAB — CBC WITH DIFFERENTIAL/PLATELET
Abs Immature Granulocytes: 0.01 10*3/uL (ref 0.00–0.07)
Basophils Absolute: 0.1 10*3/uL (ref 0.0–0.1)
Basophils Relative: 1 %
Eosinophils Absolute: 0.1 10*3/uL (ref 0.0–0.5)
Eosinophils Relative: 1 %
HCT: 51.2 % (ref 39.0–52.0)
Hemoglobin: 16.4 g/dL (ref 13.0–17.0)
Immature Granulocytes: 0 %
Lymphocytes Relative: 41 %
Lymphs Abs: 3.1 10*3/uL (ref 0.7–4.0)
MCH: 29.7 pg (ref 26.0–34.0)
MCHC: 32 g/dL (ref 30.0–36.0)
MCV: 92.8 fL (ref 80.0–100.0)
Monocytes Absolute: 0.5 10*3/uL (ref 0.1–1.0)
Monocytes Relative: 7 %
Neutro Abs: 3.7 10*3/uL (ref 1.7–7.7)
Neutrophils Relative %: 50 %
Platelets: 100 10*3/uL — ABNORMAL LOW (ref 150–400)
RBC: 5.52 MIL/uL (ref 4.22–5.81)
RDW: 12.6 % (ref 11.5–15.5)
WBC: 7.4 10*3/uL (ref 4.0–10.5)
nRBC: 0 % (ref 0.0–0.2)

## 2019-08-29 LAB — COMPREHENSIVE METABOLIC PANEL
ALT: 27 U/L (ref 0–44)
AST: 48 U/L — ABNORMAL HIGH (ref 15–41)
Albumin: 5.4 g/dL — ABNORMAL HIGH (ref 3.5–5.0)
Alkaline Phosphatase: 79 U/L (ref 38–126)
Anion gap: 11 (ref 5–15)
BUN: 12 mg/dL (ref 6–20)
CO2: 24 mmol/L (ref 22–32)
Calcium: 9.6 mg/dL (ref 8.9–10.3)
Chloride: 105 mmol/L (ref 98–111)
Creatinine, Ser: 0.89 mg/dL (ref 0.61–1.24)
GFR calc Af Amer: >60 mL/min (ref >60)
GFR calc non Af Amer: >60 mL/min (ref >60)
Glucose, Bld: 83 mg/dL (ref 70–99)
Potassium: 3.3 mmol/L — ABNORMAL LOW (ref 3.5–5.1)
Sodium: 140 mmol/L (ref 135–145)
Total Bilirubin: 1.1 mg/dL (ref 0.3–1.2)
Total Protein: 8.5 g/dL — ABNORMAL HIGH (ref 6.5–8.1)

## 2019-08-29 LAB — RAPID URINE DRUG SCREEN, HOSP PERFORMED
Amphetamines: NOT DETECTED
Barbiturates: NOT DETECTED
Benzodiazepines: NOT DETECTED
Cocaine: NOT DETECTED
Opiates: NOT DETECTED
Tetrahydrocannabinol: POSITIVE — AB

## 2019-08-29 LAB — ETHANOL: Alcohol, Ethyl (B): 36 mg/dL — ABNORMAL HIGH (ref <10)

## 2019-08-29 LAB — SARS CORONAVIRUS 2 (TAT 6-24 HRS): SARS Coronavirus 2: NEGATIVE

## 2019-08-29 MED ORDER — GABAPENTIN 300 MG PO CAPS
300.0000 mg | ORAL_CAPSULE | Freq: Two times a day (BID) | ORAL | Status: DC
  Administered 2019-08-29: 300 mg via ORAL
  Filled 2019-08-29: qty 1

## 2019-08-29 MED ORDER — GABAPENTIN 300 MG PO CAPS: 300.0000 mg | ORAL_CAPSULE | Freq: Two times a day (BID) | ORAL | 0 refills | Status: DC

## 2019-08-29 MED ORDER — QUETIAPINE FUMARATE 100 MG PO TABS: 100.0000 mg | ORAL_TABLET | Freq: Every day | ORAL | Status: DC

## 2019-08-29 MED ORDER — QUETIAPINE FUMARATE 100 MG PO TABS: 100.0000 mg | ORAL_TABLET | Freq: Every day | ORAL | 0 refills | Status: DC

## 2019-08-29 NOTE — [facility] Suicide Risk Assessment (Cosign Needed)
Suicide Risk Assessment  Discharge Assessment   [facility] Discharge Suicide Risk Assessment   Principal Problem: Cannabis-induced psychotic disorder with hallucinations (HCC) Discharge Diagnoses: Principal Problem:   Cannabis-induced psychotic disorder with hallucinations (HCC) Active Problems:   Cannabis use disorder, severe, dependence (HCC)   Schizophrenia, paranoid type (HCC)   Total Time spent with patient: 30 minutes  Musculoskeletal: Strength & Muscle Tone: within normal limits Gait & Station: normal Patient leans: N/A  Psychiatric Specialty Exam:   Blood pressure 134/76, pulse (!) 50, temperature 97.8 F (36.6 C), temperature source Oral, resp. rate 18, height 5\' 10"  (1.778 m), weight 74 kg, SpO2 100 %.Body mass index is 23.41 kg/m.  General Appearance: Casual  Eye Contact::  Good  Speech:  Clear and Coherent and Normal Rate409  Volume:  Normal  Mood:  "I'm calm, better"  Appropriate  Affect:  Appropriate and Congruent  Thought Process:  Coherent, Goal Directed and Descriptions of Associations: Intact  Orientation:  Full (Time, Place, and Person)  Thought Content:  WDL  Suicidal Thoughts:  No  Homicidal Thoughts:  No  Memory:  Immediate;   Good Recent;   Good  Judgement:  Intact  Insight:  Present  Psychomotor Activity:  Normal  Concentration:  Good  Recall:  Good  Fund of Knowledge:Good  Language: Good  Akathisia:  No  Handed:  Right  AIMS (if indicated):     Assets:  Communication Skills Desire for Improvement Housing Social Support  Sleep:     Cognition: WNL  ADL's:  Intact   Mental Status Per Nursing Assessment::   On Admission:    [id number], 23 y.o., male patient seen via tele psych by this provider, Dr. 22 ; and chart reviewed on 08/29/19.  On evaluation Samuel Martin reports he brought himself to the hospital after having an episode of auditory and visual hallucinations.  States he had use an illicit drug.  No elaboration.  Patient reports  that he is feeling much better today and does not feel that he needs to be in hospital.  At this time patient denies suicidal/self-harm/homicidal ideation, psychosis, and paranoia.  States that he lives with his aunt and uncle; and is currently employed. During evaluation Samuel Martin is alert/oriented x 4; calm/cooperative; and mood is congruent with affect.  He does not appear to be responding to internal/external stimuli or delusional thoughts.  Patient denies suicidal/self-harm/homicidal ideation, psychosis, and paranoia.  Patient answered question appropriately.     Demographic Factors:  Caucasian  Loss Factors: NA  Historical Factors: NA  Risk Reduction Factors:   Sense of responsibility to family, Religious beliefs about death, Living with another person, especially a relative and Positive social support  Continued Clinical Symptoms:  Alcohol/Substance Abuse/Dependencies Previous Psychiatric Diagnoses and Treatments  Cognitive Features That Contribute To Risk:  None    Suicide Risk:  Minimal: No identifiable suicidal ideation.  Patients presenting with no risk factors but with morbid ruminations; may be classified as minimal risk based on the severity of the depressive symptoms    Plan Of Care/Follow-up recommendations:  Activity:  As tolerated Diet:  Heart healthy     Discharge Instructions     For your mental health needs, you are advised to follow up with [facility].  Call them at your earliest opportunity to schedule an intake appointment:       [facility]      [address]      [place], [place]      [phone]  Crisis number: [phone]     Disposition: No evidence of imminent risk to self or others at present.   Patient does not meet criteria for psychiatric inpatient admission. Supportive therapy provided about ongoing stressors. Discussed crisis plan, support from social network, calling 911, coming to the Emergency Department, and  calling Suicide Hotline.  Shuvon Rankin, NP 08/29/2019, 12:45 PM

## 2019-08-29 NOTE — ED Notes (Signed)
Pt resting in bed with even, unlabored respirations.

## 2019-08-29 NOTE — ED Notes (Signed)
Pt stated he needed to use the bathroom, pt was instructed to stand at the edge of the bed and use the urinal, pt followed commands. Pt was provided with a sandwich and drink. Pt thanked staff for the drink and sandwich and took several bites out of sandwich. Pt rolled over to left side and went to sleep.

## 2019-08-29 NOTE — ED Notes (Signed)
Mother: Ramesh Moan [phone] Mother has taken pts cell phone and wallet home with her.

## 2019-08-29 NOTE — ED Notes (Signed)
Patient wanded by security. Patient ambulated to Psych ED without assistance. Patient belongings x2 bags taken to Psych ED, and patient locker key transported to Psych ED in IVC orange folder. VSS. Patient calm and cooperative at this time. Denies SI/HI.

## 2019-08-29 NOTE — ED Notes (Signed)
Pt arrived to unit calm and cooperative.  Belongings were locked in locker 35.  15 minute checks and video monitoring in place.

## 2019-08-29 NOTE — Discharge Instructions (Signed)
For your mental health needs, you are advised to follow up with [facility].  Call them at your earliest opportunity to schedule an intake appointment:       [facility]      [address]      [place], [place] [address]      [phone]      Crisis number: [phone]  

## 2019-08-29 NOTE — ED Notes (Signed)
Pt resting in bed with eyes closed. Respirations even and unlabored. No distress observed.

## 2019-08-29 NOTE — ED Notes (Signed)
Pt Belongings:  5-8 Belongings Cabinet in 2 Bags: Black Joggers White T-Shirt [place] White Tennis Administrator, arts Up: 1 Debit Card with [employer] on [address]  1: [place] 1: $10 2: $5 7:.75c 2: 10c 4:1c Key and Lock up paper in [employer].
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
## 2019-08-29 NOTE — ED Notes (Signed)
Discharge on hold until RX arrives from [facility].

## 2019-08-29 NOTE — BH Assessment (Signed)
[place] Assessment Progress Note  Per Shuvon Rankin, FNP, this pt does not require psychiatric hospitalization at this time.  Pt presents under IVC initiated by EDP Addison Lank MD, which Hampton Abbot, MD has rescinded.  Pt is to be discharged from [facility] with referral information for [facility].  This has been included in pt's discharge instructions.  Pt is also to be provided with prescriptions before departing, which this writer will transport from [facility].  Pt's nurse, Caryl Pina, has been notified.  Jalene Mullet, [place] Triage Specialist [phone]

## 2019-08-29 NOTE — ED Notes (Signed)
EKG given to EDP,Pedro,MD., for review. 

## 2019-08-29 NOTE — ED Notes (Signed)
Pt calm, cooperative and follows commands. Pt ambulated to bathroom with a steady gait, no assistance required. Pt requesting something to eat and drink. Pt informed breakfast tray is on the way, will give pt crackers and peanut butter until tray comes. Pt sitting in bed watching TV.

## 2019-08-31 ENCOUNTER — Inpatient Hospital Stay (HOSPITAL_COMMUNITY)
Admission: RE | Admit: 2019-08-31 | Discharge: 2019-09-06 | DRG: 885 | Disposition: A | Discharge status: Home / Self Care | Payer: Federal, State, Local not specified - Other | Attending: Psychiatry | Admitting: Psychiatry

## 2019-08-31 ENCOUNTER — Other Ambulatory Visit: Payer: Self-pay

## 2019-08-31 ENCOUNTER — Other Ambulatory Visit: Payer: Self-pay | Admitting: Registered Nurse

## 2019-08-31 ENCOUNTER — Encounter (HOSPITAL_COMMUNITY): Payer: Self-pay | Admitting: Behavioral Health

## 2019-08-31 ENCOUNTER — Inpatient Hospital Stay (HOSPITAL_COMMUNITY): Admission: AD | Admit: 2019-08-31 | Payer: Federal, State, Local not specified - Other | Admitting: Psychiatry

## 2019-08-31 DIAGNOSIS — D696 Thrombocytopenia, unspecified: Secondary | ICD-10-CM | POA: Diagnosis present

## 2019-08-31 DIAGNOSIS — Z20828 Contact with and (suspected) exposure to other viral communicable diseases: Secondary | ICD-10-CM | POA: Diagnosis present

## 2019-08-31 DIAGNOSIS — F200 Paranoid schizophrenia: Secondary | ICD-10-CM | POA: Diagnosis not present

## 2019-08-31 DIAGNOSIS — Z9119 Patient's noncompliance with other medical treatment and regimen: Secondary | ICD-10-CM | POA: Diagnosis not present

## 2019-08-31 DIAGNOSIS — F17210 Nicotine dependence, cigarettes, uncomplicated: Secondary | ICD-10-CM | POA: Diagnosis present

## 2019-08-31 DIAGNOSIS — F1220 Cannabis dependence, uncomplicated: Secondary | ICD-10-CM | POA: Diagnosis present

## 2019-08-31 DIAGNOSIS — F250 Schizoaffective disorder, bipolar type: Secondary | ICD-10-CM | POA: Diagnosis not present

## 2019-08-31 DIAGNOSIS — Z91013 Allergy to seafood: Secondary | ICD-10-CM

## 2019-08-31 DIAGNOSIS — F29 Unspecified psychosis not due to a substance or known physiological condition: Secondary | ICD-10-CM | POA: Diagnosis present

## 2019-08-31 DIAGNOSIS — F4310 Post-traumatic stress disorder, unspecified: Secondary | ICD-10-CM | POA: Diagnosis present

## 2019-08-31 DIAGNOSIS — Z8249 Family history of ischemic heart disease and other diseases of the circulatory system: Secondary | ICD-10-CM

## 2019-08-31 DIAGNOSIS — F209 Schizophrenia, unspecified: Principal | ICD-10-CM | POA: Diagnosis present

## 2019-08-31 LAB — SARS CORONAVIRUS 2 BY RT PCR (HOSPITAL ORDER, PERFORMED IN ~~LOC~~ HOSPITAL LAB): SARS Coronavirus 2: NEGATIVE

## 2019-08-31 MED ORDER — GABAPENTIN 300 MG PO CAPS
300.0000 mg | ORAL_CAPSULE | Freq: Two times a day (BID) | ORAL | Status: DC
  Administered 2019-09-01 – 2019-09-06 (×11): 300 mg via ORAL
  Filled 2019-08-31: qty 1
  Filled 2019-08-31: qty 14
  Filled 2019-08-31 (×14): qty 1
  Filled 2019-08-31: qty 14

## 2019-08-31 MED ORDER — ZIPRASIDONE MESYLATE 20 MG IM SOLR
20.0000 mg | Freq: Once | INTRAMUSCULAR | Status: AC | PRN
  Administered 2019-08-31: 18:00:00 20 mg via INTRAMUSCULAR

## 2019-08-31 MED ORDER — LORAZEPAM 2 MG/ML IJ SOLN: 1.0000 mg | Freq: Once | INTRAMUSCULAR | Status: DC | PRN

## 2019-08-31 MED ORDER — ALUM & MAG HYDROXIDE-SIMETH 200-200-20 MG/5ML PO SUSP: 30.0000 mL | ORAL | Status: DC | PRN

## 2019-08-31 MED ORDER — LORAZEPAM 2 MG/ML IJ SOLN
INTRAMUSCULAR | Status: AC
  Administered 2019-08-31: 1 mg
  Filled 2019-08-31: qty 1

## 2019-08-31 MED ORDER — QUETIAPINE FUMARATE 100 MG PO TABS
100.0000 mg | ORAL_TABLET | Freq: Every day | ORAL | Status: DC
  Filled 2019-08-31 (×3): qty 1

## 2019-08-31 MED ORDER — LORAZEPAM 1 MG PO TABS
1.0000 mg | ORAL_TABLET | ORAL | Status: DC | PRN
  Filled 2019-08-31: qty 1

## 2019-08-31 MED ORDER — ZIPRASIDONE MESYLATE 20 MG IM SOLR
20.0000 mg | Freq: Two times a day (BID) | INTRAMUSCULAR | Status: DC | PRN
  Filled 2019-08-31 (×2): qty 20

## 2019-08-31 MED ORDER — ACETAMINOPHEN 325 MG PO TABS: 650.0000 mg | ORAL_TABLET | Freq: Four times a day (QID) | ORAL | Status: DC | PRN

## 2019-08-31 MED ORDER — MAGNESIUM HYDROXIDE 400 MG/5ML PO SUSP
30.0000 mL | Freq: Every day | ORAL | Status: DC | PRN
  Administered 2019-09-04: 30 mL via ORAL
  Filled 2019-08-31: qty 30

## 2019-08-31 NOTE — H&P (Signed)
Behavioral Health Medical Screening Exam  Samuel Martin is an 23 y.o. male. Patient present to [facility] under IVC due to bizarre behavior.  He is currently denying suicidal or homicidal ideations.  Denies auditory or visual hallucinations.  Chart reviewed patient recently discharged 2 days prior.  Patient reports taking medications as directed.  Per IVC patient observed walking into traffic and standing on his room.  [name] admits to standing on the roof however denied walking into traffic.  Citing " that is crazy why would I do the."  Collateral information obtained by counselor. See chart  Total Time spent with patient: 15 minutes  Psychiatric Specialty Exam: Physical Exam  Psychiatric: He has a normal mood and affect.    Review of Systems  Psychiatric/Behavioral: Positive for depression. Substance abuse: denied. The patient is nervous/anxious.   All other systems reviewed and are negative.   There were no vitals taken for this visit.There is no height or weight on file to calculate BMI.  General Appearance: Casual  Eye Contact:  Fair  Speech:  Clear and Coherent  Volume:  Normal  Mood:  Anxious and Depressed  Affect:  Congruent  Thought Process:  Coherent  Orientation:  Full (Time, Place, and Person)  Thought Content:  Hallucinations: None  Suicidal Thoughts:  No  Homicidal Thoughts:  No  Memory:  Immediate;   Fair Recent;   Fair  Judgement:  Fair  Insight:  Fair  Psychomotor Activity:  Normal  Concentration: Concentration: Fair  Recall:  [employer] of Knowledge:Fair  Language: Fair  Akathisia:  No  Handed:  Right  AIMS (if indicated):     Assets:  Communication Skills Desire for Improvement Resilience Social Support  Sleep:       Musculoskeletal: Strength & Muscle Tone: within normal limits Gait & Station: normal Patient leans: N/A  There were no vitals taken for this visit.  Recommendations: Overnight observation Restart medications where appropriate Based on my  evaluation the patient does not appear to have an emergency medical condition.  Derrill Center, NP 08/31/2019, 12:03 PM
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
## 2019-09-01 ENCOUNTER — Encounter (HOSPITAL_COMMUNITY): Payer: Self-pay

## 2019-09-01 DIAGNOSIS — F200 Paranoid schizophrenia: Secondary | ICD-10-CM

## 2019-09-01 LAB — LIPID PANEL
Cholesterol: 145 mg/dL (ref 0–200)
HDL: 55 mg/dL (ref >40)
LDL Cholesterol: 76 mg/dL (ref 0–99)
Total CHOL/HDL Ratio: 2.6 RATIO
Triglycerides: 68 mg/dL (ref <150)
VLDL: 14 mg/dL (ref 0–40)

## 2019-09-01 LAB — HEMOGLOBIN A1C
Hgb A1c MFr Bld: 5.5 % (ref 4.8–5.6)
Mean Plasma Glucose: 111.15 mg/dL

## 2019-09-01 LAB — TSH: TSH: 0.761 u[IU]/mL (ref 0.350–4.500)

## 2019-09-01 LAB — ETHANOL: Alcohol, Ethyl (B): <10 mg/dL (ref <10)

## 2019-09-01 MED ORDER — QUETIAPINE FUMARATE 100 MG PO TABS
100.0000 mg | ORAL_TABLET | Freq: Three times a day (TID) | ORAL | Status: DC
  Administered 2019-09-01 – 2019-09-06 (×15): 100 mg via ORAL
  Filled 2019-09-01 (×4): qty 1
  Filled 2019-09-01: qty 21
  Filled 2019-09-01 (×9): qty 1
  Filled 2019-09-01: qty 21
  Filled 2019-09-01 (×2): qty 1
  Filled 2019-09-01: qty 21
  Filled 2019-09-01 (×3): qty 1

## 2019-09-01 MED ORDER — TEMAZEPAM 30 MG PO CAPS
30.0000 mg | ORAL_CAPSULE | Freq: Every day | ORAL | Status: DC
  Administered 2019-09-01 – 2019-09-05 (×5): 30 mg via ORAL
  Filled 2019-09-01 (×5): qty 1

## 2019-09-01 MED ORDER — RISPERIDONE 2 MG PO TABS
2.0000 mg | ORAL_TABLET | Freq: Two times a day (BID) | ORAL | Status: DC
  Administered 2019-09-01 – 2019-09-04 (×7): 2 mg via ORAL
  Filled 2019-09-01 (×13): qty 1

## 2019-09-01 NOTE — BHH Suicide Risk Assessment (Signed)
[facility] Admission Suicide Risk Assessment   Nursing information obtained from:    Demographic factors:  NA Current Mental Status:  NA Loss Factors:  NA Historical Factors:  Impulsivity, NA Risk Reduction Factors:  Living with another person, especially a relative, Positive social support, Positive therapeutic relationship, Positive coping skills or problem solving skills  Total Time spent with patient: 45 minutes Principal Problem: <principal problem not specified> Diagnosis:  Active Problems:   Psychosis (HCC)  Subjective Data: Admitted for stabilization  Continued Clinical Symptoms:  Alcohol Use Disorder Identification Test Final Score (AUDIT): 2 The "Alcohol Use Disorders Identification Test", Guidelines for Use in Primary Care, Second Edition.  World Pharmacologist [facility]). Score between 0-7:  no or low risk or alcohol related problems. Score between 8-15:  moderate risk of alcohol related problems. Score between 16-19:  high risk of alcohol related problems. Score 20 or above:  warrants further diagnostic evaluation for alcohol dependence and treatment.   CLINICAL FACTORS: Acute exacerbation of psychotic disorder  Musculoskeletal: Strength & Muscle Tone: within normal limits Gait & Station: normal Patient leans: N/A  Psychiatric Specialty Exam: Physical Exam  Nursing note and vitals reviewed. Constitutional: He appears well-developed and well-nourished.  Cardiovascular: Normal rate and regular rhythm.    Review of Systems  Constitutional: Negative.   Eyes: Negative.   Cardiovascular: Negative.   Gastrointestinal: Negative.   Genitourinary: Negative.   Neurological: Negative.   Endo/Heme/Allergies: Negative.     Blood pressure 121/79, pulse 62, temperature (!) 97.5 F (36.4 C), temperature source Oral, resp. rate 18, height 5\' 10"  (1.778 m), weight 69.9 kg, SpO2 100 %.Body mass index is 22.1 kg/m.  General Appearance: Casual and Disheveled  Eye Contact:   Minimal  Speech:  Normal Rate  Volume:  Increased  Mood:  Angry and Irritable  Affect:  Congruent and Constricted  Thought Process:  Linear and Descriptions of Associations: Loose  Orientation:  Full (Time, Place, and Person)  Thought Content:  Illogical and Hallucinations: Auditory note denies currently has likely had them recently  Suicidal Thoughts:  No  Homicidal Thoughts:  Denies thoughts of harming self or others  Memory:  Immediate;   Poor Recent;   Poor Remote;   Poor  Judgement:  Impaired  Insight:  Lacking  Psychomotor Activity:  Normal  Concentration:  Concentration: Fair and Attention Span: Fair  Recall:  [employer] of Knowledge:  Fair  Language:  Fair  Akathisia:  Negative  Handed:  Right  AIMS (if indicated):     Assets:  Physical Health Resilience  ADL's:  Intact  Cognition:  WNL  Sleep:  Number of Hours: 5(transferred from OBS after midnight)    COGNITIVE FEATURES THAT CONTRIBUTE TO RISK:  Loss of executive function    SUICIDE RISK:   Minimal: No identifiable suicidal ideation.  Patients presenting with no risk factors but with morbid ruminations; may be classified as minimal risk based on the severity of the depressive symptoms  PLAN OF CARE: Admit for stabilization and long-acting injectable necessary  I certify that inpatient services furnished can reasonably be expected to improve the patient's condition.   Johnn Hai, MD 09/01/2019, 12:56 PM

## 2019-09-01 NOTE — Progress Notes (Signed)
Recreation Therapy Notes  Date: 11.19.20 Time: 0945 Location: [address] Dayroom  Group Topic: Leisure Education  Goal Area(s) Addresses:  Patient will identify positive leisure activities.  Patient will identify one positive benefit of participation in leisure activities.   Behavioral Response: Engaged  Intervention: Leisure Group Game  Activity: Leisure Freight forwarder.  LRT and patients played a game of pictionary.  One person would get a word from the container and draw it on the board, the person that guesses the picture would get the next turn.  Each person had one minute.  Education:  Leisure Education, Dentist  Education Outcome: Acknowledges education/In group clarification offered/Needs additional education  Clinical Observations/Feedback: Pt came for last few minutes of group.  Pt engaged with the encouragement of peers.  Pt seemed to become more relaxed during group.    Victorino Sparrow, LRT/CTRS     Ria Comment, Jevaughn Degollado A 09/01/2019 11:08 AM
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
## 2019-09-01 NOTE — Progress Notes (Signed)
Psychoeducational Group Note  Date:  09/01/2019 Time:  2040  Group Topic/Focus:  Wrap-Up Group:   The focus of this group is to help patients review their daily goal of treatment and discuss progress on daily workbooks.  Participation Level: Did Not Attend  Participation Quality:  Not Applicable  Affect:  Not Applicable  Cognitive:  Not Applicable  Insight:  Not Applicable  Engagement in Group: Not Applicable  Additional Comments:  The patient did not attend group this evening since he was asleep in his bedroom.   Archie Balboa S 09/01/2019, 8:39 PM

## 2019-09-01 NOTE — Progress Notes (Signed)
   09/01/19 2000  Psych Admission Type (Psych Patients Only)  Admission Status Involuntary  Psychosocial Assessment  Patient Complaints Anxiety  Eye Contact Fair  Facial Expression Sad;Anxious  Affect Preoccupied;Sad  Speech Logical/coherent  Interaction Assertive  Motor Activity Slow  Appearance/Hygiene Body odor;Disheveled  Behavior Characteristics Cooperative  Mood Depressed  Aggressive Behavior  Effect No apparent injury  Thought Process  Coherency WDL  Content WDL  Delusions None reported or observed  Perception WDL  Hallucination None reported or observed  Judgment Impaired  Confusion None  Danger to Self  Current suicidal ideation? Denies  Danger to Others  Danger to Others None reported or observed

## 2019-09-01 NOTE — Tx Team (Signed)
Initial Treatment Plan 09/01/2019 1:00 AM Samuel Martin EHU:[id number]    PATIENT STRESSORS: Financial difficulties Marital or family conflict Medication change or noncompliance   PATIENT STRENGTHS: General fund of knowledge Supportive family/friends Work skills   PATIENT IDENTIFIED PROBLEMS:  psychosis  depressed  "nothing"                 DISCHARGE CRITERIA:  Improved stabilization in mood, thinking, and/or behavior Verbal commitment to aftercare and medication compliance  PRELIMINARY DISCHARGE PLAN: Attend aftercare/continuing care group Attend PHP/IOP Outpatient therapy  PATIENT/FAMILY INVOLVEMENT: This treatment plan has been presented to and reviewed with the patient, Samuel Martin .  The patient and family have been given the opportunity to ask questions and make suggestions.  Providence Crosby, RN 09/01/2019, 1:00 AM

## 2019-09-01 NOTE — BHH Suicide Risk Assessment (Cosign Needed)
[facility] INPATIENT:  Family/Significant Other Suicide Prevention Education  Suicide Prevention Education:  Education Completed; with mother, Samuel Martin, [phone] has been identified by the patient as the family member/significant other with whom the patient will be residing, and identified as the person(s) who will aid the patient in the event of a mental health crisis (suicidal ideations/suicide attempt).  With written consent from the patient, the family member/significant other has been provided the following suicide prevention education, prior to the and/or following the discharge of the patient.  The suicide prevention education provided includes the following:  Suicide risk factors  Suicide prevention and interventions  National Suicide Hotline telephone number  [facility] assessment telephone number  [facility] Emergency Assistance [place] and/or Residential Mobile Crisis Unit telephone number  Request made of family/significant other to:  Remove weapons (e.g., guns, rifles, knives), all items previously/currently identified as safety concern.    Remove drugs/medications (over-the-counter, prescriptions, illicit drugs), all items previously/currently identified as a safety concern.  The family member/significant other verbalizes understanding of the suicide prevention education information provided.  The family member/significant other agrees to remove the items of safety concern listed above.  Pt's mother stated that pt will not take his medication and will do/say anything to be discharged even if he is not ready. Pt's mother mentioned he does not have any guns, however, he lives with 6 other people and is unaware if they have any guns. She also stated this is the 3rd episode this year and usually only happens once a year.   Samuel Martin 09/01/2019, 10:39 AM

## 2019-09-01 NOTE — BHH Group Notes (Cosign Needed)
BHH LCSW Group Therapy Note  Date/Time: 1:30pm 09/01/2019  Type of Therapy/Topic:  Group Therapy:  Feelings about Diagnosis  Participation Level:  Did Not Attend   Mood: Pt did not attend    Description of Group:    This group will allow patients to explore their thoughts and feelings about diagnoses they have received. Patients will be guided to explore their level of understanding and acceptance of these diagnoses. Facilitator will encourage patients to process their thoughts and feelings about the reactions of others to their diagnosis, and will guide patients in identifying ways to discuss their diagnosis with significant others in their lives. This group will be process-oriented, with patients participating in exploration of their own experiences as well as giving and receiving support and challenge from other group members.   Therapeutic Goals: 1. Patient will demonstrate understanding of diagnosis as evidence by identifying two or more symptoms of the disorder:  2. Patient will be able to express two feelings regarding the diagnosis 3. Patient will demonstrate ability to communicate their needs through discussion and/or role plays  Summary of Patient Progress:  Pt did not attend group session.     Therapeutic Modalities:   Cognitive Behavioral Therapy Brief Therapy Feelings Identification   Szymon Foiles, MSW intern   
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
## 2019-09-01 NOTE — Progress Notes (Signed)
Recreation Therapy Notes  INPATIENT RECREATION THERAPY ASSESSMENT  Patient Details Name: Samuel Martin MRN: [id number] DOB: [birth year] Today's Date: 09/01/2019       Information Obtained From: Patient  Able to Participate in Assessment/Interview: Yes  Patient Presentation: Alert  Reason for Admission (Per Patient): Other (Comments)(Pt stated he was IVC'd)  Patient Stressors: Family  Coping Skills:   Isolation, TV, Sports, Exercise, Deep Breathing, Meditate, Music, Substance Abuse, Talk, Prayer, Avoidance, Hot Bath/Shower  Leisure Interests (2+):  Exercise - Walking, Individual - Other (Comment)(Like to learn new things)  Frequency of Recreation/Participation: Other (Comment)("whenever I can")  Awareness of Community Resources:  Yes  Community Resources:  Library, Engineer, building services, Other (Comment)(Track, Stores)  Current Use: Yes  If no, Barriers?:    Expressed Interest in [employer] Information: No  [employer] of Residence:  [place]  Patient Main Form of Transportation: Walk  Patient Strengths:  Mental Status; Word Play  Patient Identified Areas of Improvement:  Arm Strength  Patient Goal for Hospitalization:  "leave as soon as I can"  Current SI (including self-harm):  No  Current HI:  No  Current AVH: No  Staff Intervention Plan: Group Attendance, Collaborate with Interdisciplinary Treatment Team  Consent to Intern Participation: N/A    Victorino Sparrow, LRT/CTRS  Victorino Sparrow A 09/01/2019, 1:23 PM
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
## 2019-09-02 LAB — PROLACTIN: Prolactin: 9.2 ng/mL (ref 4.0–15.2)
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
## 2019-09-02 NOTE — Tx Team (Signed)
Interdisciplinary Treatment and Diagnostic Plan Update  09/02/2019 Time of Session: 10:10 am  Samuel Martin MRN: [id number]  Principal Diagnosis: <principal problem not specified>  Secondary Diagnoses: Active Problems:   Psychosis ([place])   Current Medications:  Current Facility-Administered Medications  Medication Dose Route Frequency Provider Last Rate Last Dose  . acetaminophen (TYLENOL) tablet 650 mg  650 mg Oral Q6H PRN Mordecai Maes, NP      . alum & mag hydroxide-simeth (MAALOX/MYLANTA) 200-200-20 MG/5ML suspension 30 mL  30 mL Oral Q4H PRN Mordecai Maes, NP      . gabapentin (NEURONTIN) capsule 300 mg  300 mg Oral BID Mordecai Maes, NP   300 mg at 09/02/19 0818  . LORazepam (ATIVAN) injection 1 mg  1 mg Intramuscular Once PRN Rankin, Shuvon B, NP      . magnesium hydroxide (MILK OF MAGNESIA) suspension 30 mL  30 mL Oral Daily PRN Mordecai Maes, NP      . QUEtiapine (SEROQUEL) tablet 100 mg  100 mg Oral TID Johnn Hai, MD   100 mg at 09/02/19 0818  . risperiDONE (RISPERDAL) tablet 2 mg  2 mg Oral BID Johnn Hai, MD   2 mg at 09/02/19 0818  . temazepam (RESTORIL) capsule 30 mg  30 mg Oral QHS Johnn Hai, MD   30 mg at 09/01/19 2111   PTA Medications: Medications Prior to Admission  Medication Sig Dispense Refill Last Dose  . gabapentin (NEURONTIN) 300 MG capsule Take 1 capsule (300 mg total) by mouth 2 (two) times daily. 60 capsule 0   . QUEtiapine (SEROQUEL) 100 MG tablet Take 1 tablet (100 mg total) by mouth at bedtime. 30 tablet 0     Patient Stressors: Financial difficulties Marital or family conflict Medication change or noncompliance  Patient Strengths: General fund of knowledge Supportive family/friends Work skills  Treatment Modalities: Medication Management, Group therapy, Case management,  1 to 1 session with clinician, Psychoeducation, Recreational therapy.   Physician Treatment Plan for Primary Diagnosis: <principal problem not  specified> Long Term Goal(s): Improvement in symptoms so as ready for discharge Improvement in symptoms so as ready for discharge   Short Term Goals: Ability to demonstrate self-control will improve Ability to identify and develop effective coping behaviors will improve Ability to maintain clinical measurements within normal limits will improve Compliance with prescribed medications will improve Ability to disclose and discuss suicidal ideas Ability to identify and develop effective coping behaviors will improve Ability to maintain clinical measurements within normal limits will improve  Medication Management: Evaluate patient's response, side effects, and tolerance of medication regimen.  Therapeutic Interventions: 1 to 1 sessions, Unit Group sessions and Medication administration.  Evaluation of Outcomes: Not Met  Physician Treatment Plan for Secondary Diagnosis: Active Problems:   Psychosis ([place])  Long Term Goal(s): Improvement in symptoms so as ready for discharge Improvement in symptoms so as ready for discharge   Short Term Goals: Ability to demonstrate self-control will improve Ability to identify and develop effective coping behaviors will improve Ability to maintain clinical measurements within normal limits will improve Compliance with prescribed medications will improve Ability to disclose and discuss suicidal ideas Ability to identify and develop effective coping behaviors will improve Ability to maintain clinical measurements within normal limits will improve     Medication Management: Evaluate patient's response, side effects, and tolerance of medication regimen.  Therapeutic Interventions: 1 to 1 sessions, Unit Group sessions and Medication administration.  Evaluation of Outcomes: Not Met   RN Treatment Plan for Primary  Diagnosis: <principal problem not specified> Long Term Goal(s): Knowledge of disease and therapeutic regimen to maintain health will  improve  Short Term Goals: Ability to participate in decision making will improve, Ability to verbalize feelings will improve, Ability to disclose and discuss suicidal ideas, Ability to identify and develop effective coping behaviors will improve and Compliance with prescribed medications will improve  Medication Management: RN will administer medications as ordered by provider, will assess and evaluate patient's response and provide education to patient for prescribed medication. RN will report any adverse and/or side effects to prescribing provider.  Therapeutic Interventions: 1 on 1 counseling sessions, Psychoeducation, Medication administration, Evaluate responses to treatment, Monitor vital signs and CBGs as ordered, Perform/monitor CIWA, COWS, AIMS and Fall Risk screenings as ordered, Perform wound care treatments as ordered.  Evaluation of Outcomes: Not Met   LCSW Treatment Plan for Primary Diagnosis: <principal problem not specified> Long Term Goal(s): Safe transition to appropriate next level of care at discharge, Engage patient in therapeutic group addressing interpersonal concerns.  Short Term Goals: Engage patient in aftercare planning with referrals and resources and Increase skills for wellness and recovery  Therapeutic Interventions: Assess for all discharge needs, 1 to 1 time with Social worker, Explore available resources and support systems, Assess for adequacy in community support network, Educate family and significant other(s) on suicide prevention, Complete Psychosocial Assessment, Interpersonal group therapy.  Evaluation of Outcomes: Not Met   Progress in Treatment: Attending groups: No. Participating in groups: No. Taking medication as prescribed: Yes. Toleration medication: Yes. Family/Significant other contact made: Yes, individual(s) contacted:  pt's mother  Patient understands diagnosis: No. Discussing patient identified problems/goals with staff: Yes. Medical  problems stabilized or resolved: Yes. Denies suicidal/homicidal ideation: Yes. Issues/concerns per patient self-inventory: No. Other:   New problem(s) identified: No, Describe:  none  New Short Term/Long Term Goal(s): Medication stabilization, elimination of SI thoughts, and development of a comprehensive mental wellness plan.   Patient Goals:  Pt did not state goals   Discharge Plan or Barriers: CSW will continue to follow up for appropriate referrals and possible discharge planning  Reason for Continuation of Hospitalization: Aggression Delusions  Medication stabilization  Estimated Length of Stay: 3-5 days   Attendees: Patient: Samuel Martin 09/02/2019 10:35 AM  Physician: Dr. Jake Samples, MD 09/02/2019 10:35 AM  Nursing: Vladimir Faster, RN 09/02/2019 10:35 AM  RN Care Manager: 09/02/2019 10:35 AM  Social Worker: Ardelle Anton, LCSW 09/02/2019 10:35 AM  Recreational Therapist:  09/02/2019 10:35 AM  Other: Ovidio Kin, MSW intern  09/02/2019 10:35 AM  Other:  09/02/2019 10:35 AM  Other: 09/02/2019 10:35 AM    Scribe for Treatment Team: [name], Student-Social Work 09/02/2019 10:45 AM

## 2019-09-02 NOTE — Progress Notes (Signed)
   09/02/19 2100  Psych Admission Type (Psych Patients Only)  Admission Status Involuntary  Psychosocial Assessment  Patient Complaints Anxiety  Eye Contact Fair  Facial Expression Sad;Anxious  Affect Preoccupied;Sad  Speech Logical/coherent  Interaction Assertive  Motor Activity Slow  Appearance/Hygiene Body odor;Disheveled  Behavior Characteristics Cooperative  Mood Depressed  Aggressive Behavior  Effect No apparent injury  Thought Process  Coherency WDL  Content WDL  Delusions None reported or observed  Perception WDL  Hallucination None reported or observed  Judgment Impaired  Confusion None  Danger to Self  Current suicidal ideation? Denies  Danger to Others  Danger to Others None reported or observed   Pt been appropriate this evening

## 2019-09-02 NOTE — Progress Notes (Signed)
Adult Psychoeducational Group Note  Date:  09/02/2019 Time:  9:51 PM  Group Topic/Focus:  Wrap-Up Group:   The focus of this group is to help patients review their daily goal of treatment and discuss progress on daily workbooks.  Participation Level:  Minimal  Participation Quality:  Appropriate  Affect:  Appropriate  Cognitive:  Appropriate  Insight: Appropriate  Engagement in Group:  Developing/Improving  Modes of Intervention:  Discussion  Additional Comments:  Pt stated his goal for today was to focus on his treatment plan. Pt stated he accomplished his goal today. Pt stated his relationship with his family has improved since he was admitted her. Pt stated he was able to contact his Grandmother and Mother today, which improved his day. Pt stated he felt better about himself today. Pt rated his overall day a 8 out of 10. Pt stated his appetite was pretty good today. Pt stated he slept pretty good last night. Pt stated his goal for tonight was to get some rest. Pt stated he was in no physical pain tonight. Pt stated he was not hearing or seeing anything that was not there. Pt stated he had no thoughts of harming himself or others. Pt stated if anything change he would alert staff.  Candy Sledge 09/02/2019, 9:51 PM

## 2019-09-02 NOTE — Progress Notes (Signed)
Recreation Therapy Notes  Date: 11.20.20 Time: 1000 Location: 500 Hall Dayroom  Group Topic: Coping Skills  Goal Area(s) Addresses:  Patient will identify positive coping skills. Patient will identify benefits of using positive coping skills post d/c.  Behavioral Response: Engaged  Intervention: Worksheet  Activity: Healthy vs. Unhealthy Coping Strategies.  Patients were to identify a problem they are currently facing.  Patients then identified the unhealthy coping strategies and consequences.  Patients also identified healthy coping strategies, expected outcomes and barriers to using healthy coping strategies.  Education: Radiographer, therapeutic, Dentist.   Education Outcome: Acknowledges understanding/In group clarification offered/Needs additional education.   Clinical Observations/Feedback: Pt identified current problem as "social withdrawal".  Pt expressed unhealthy coping strategies were smoking weed and bizarre behavior.  Pt identified the consequences of these were he ended up in the hospital.  Pt expressed his healthy coping strategy is to start talking out his problems.  An expected outcome would be to "come to an understanding".  Pt identified barriers as "the other person is not receptive" to hearing him out.     Victorino Sparrow, LRT/CTRS    Victorino Sparrow A 09/02/2019 11:32 AM

## 2019-09-03 DIAGNOSIS — F250 Schizoaffective disorder, bipolar type: Secondary | ICD-10-CM

## 2019-09-03 DIAGNOSIS — F1220 Cannabis dependence, uncomplicated: Secondary | ICD-10-CM

## 2019-09-03 DIAGNOSIS — D696 Thrombocytopenia, unspecified: Secondary | ICD-10-CM

## 2019-09-03 DIAGNOSIS — F209 Schizophrenia, unspecified: Principal | ICD-10-CM

## 2019-09-03 NOTE — [facility] Group Notes (Addendum)
[place] Group Notes: (Clinical Social Work)   09/03/2019      Type of Therapy:  Group Therapy   Participation Level:  Group was cancelled due to acuity on the hall   [employer], [place] 09/03/2019, 4:50 PM

## 2019-09-03 NOTE — Progress Notes (Signed)
   09/03/19 0800  Psych Admission Type (Psych Patients Only)  Admission Status Involuntary  Psychosocial Assessment  Patient Complaints None  Eye Contact Fair  Facial Expression Flat  Affect Appropriate to circumstance  Speech Logical/coherent  Interaction Assertive  Motor Activity Slow  Appearance/Hygiene Disheveled  Behavior Characteristics Appropriate to situation  Mood Anxious  Aggressive Behavior  Effect No apparent injury  Thought Process  Coherency WDL  Content WDL  Delusions None reported or observed  Perception WDL  Hallucination None reported or observed  Judgment Impaired  Confusion None  Danger to Self  Current suicidal ideation? Denies  Danger to Others  Danger to Others None reported or observed
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
## 2019-09-04 MED ORDER — RISPERIDONE 3 MG PO TABS
3.0000 mg | ORAL_TABLET | Freq: Two times a day (BID) | ORAL | Status: DC
  Administered 2019-09-04 – 2019-09-06 (×4): 3 mg via ORAL
  Filled 2019-09-04: qty 1
  Filled 2019-09-04: qty 14
  Filled 2019-09-04 (×4): qty 1
  Filled 2019-09-04: qty 14
  Filled 2019-09-04: qty 1

## 2019-09-04 NOTE — Progress Notes (Signed)
   09/03/19 2120  Psych Admission Type (Psych Patients Only)  Admission Status Involuntary  Psychosocial Assessment  Patient Complaints None  Eye Contact Fair  Facial Expression Flat  Affect Appropriate to circumstance  Speech Logical/coherent  Interaction Assertive  Motor Activity Slow  Appearance/Hygiene Disheveled  Behavior Characteristics Cooperative;Appropriate to situation  Mood Anxious;Pleasant  Aggressive Behavior  Effect No apparent injury  Thought Process  Coherency WDL  Content WDL  Delusions None reported or observed  Perception WDL  Hallucination None reported or observed  Judgment Impaired  Confusion None  Danger to Self  Current suicidal ideation? Denies  Danger to Others  Danger to Others None reported or observed

## 2019-09-04 NOTE — Progress Notes (Signed)
D:  Patient's self inventory sheet, patient has fair sleep, no sleep medication.  Good appetite, normal energy, good concentration.  Denied depression, hopeless and anxiety.  Denied withdrawals.  Denied SI.  Denied physical problems.  Denied physical pain.  Goal is discharge plan.   Plans to continue to listen to nurses and take meds.  Plans is to keep getting medicines.  No discharge plans. A:  Medications administered per MD orders.  Emotional support and encouragement given patient. R:  Denied SI and HI, contracts for safety.  Denied A/V hallucinations.  Safety maintained with 15 minute checks.
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
## 2019-09-04 NOTE — Progress Notes (Signed)
Adult Psychoeducational Group Note  Date:  09/04/2019 Time:  1:42 AM  Group Topic/Focus:  Wrap-Up Group:   The focus of this group is to help patients review their daily goal of treatment and discuss progress on daily workbooks.  Participation Level:  Active  Participation Quality:  Appropriate  Affect:  Appropriate  Cognitive:  Appropriate  Insight: Appropriate  Engagement in Group:  Developing/Improving  Modes of Intervention:  Discussion  Additional Comments:Pt stated his goal for today was to focus on his treatment plan. Pt stated he accomplished his goal today. Pt stated his relationship with his family has improved since he was admitted her. Pt stated he was able to contact his Grandmother and Mother today, which improved his day. Pt stated he felt better about himself today. Pt rated his overall day a 6 out of 10. Pt stated his appetite was pretty good today. Pt stated he slept pretty good last night. Pt stated his goal for tonight was to get some rest. Pt stated he was in no physical pain tonight. Pt stated he was not hearing or seeing anything that was not there. Pt stated he had no thoughts of harming himself or others. Pt stated if anything change he would alert staff.    Candy Sledge 09/04/2019, 1:42 AM

## 2019-09-04 NOTE — B[facility] Group Notes (Signed)
[facility] LCSW Group Therapy Note  Date/Time:  09/04/2019  11:00AM-12:00PM  Type of Therapy and Topic:  Group Therapy:  Music and Mood  Participation Level:  Active   Description of Group: In this process group, members listened to a variety of genres of music and identified that different types of music evoke different responses.  Patients were encouraged to identify music that was soothing for them and music that was energizing for them.  Patients discussed how this knowledge can help with wellness and recovery in various ways including managing depression and anxiety as well as encouraging healthy sleep habits.    Therapeutic Goals: 1. Patients will explore the impact of different varieties of music on mood 2. Patients will verbalize the thoughts they have when listening to different types of music 3. Patients will identify music that is soothing to them as well as music that is energizing to them 4. Patients will discuss how to use this knowledge to assist in maintaining wellness and recovery 5. Patients will explore the use of music as a coping skill  Summary of Patient Progress:  At the beginning of group, patient expressed that he felt "pissed."  He sang along, danced in his chair and in general seemed to enjoy the songs.  At the end of group, patient expressed "better but still pissed."    Therapeutic Modalities: Solution Focused Brief Therapy Activity   Selmer Dominion, LCSW

## 2019-09-05 MED ORDER — PALIPERIDONE PALMITATE ER 234 MG/1.5ML IM SUSY
234.0000 mg | PREFILLED_SYRINGE | Freq: Once | INTRAMUSCULAR | Status: AC
  Administered 2019-09-05: 234 mg via INTRAMUSCULAR
  Filled 2019-09-05: qty 1.5

## 2019-09-05 NOTE — Progress Notes (Signed)
   09/04/19 2120  Psych Admission Type (Psych Patients Only)  Admission Status Involuntary  Psychosocial Assessment  Patient Complaints None  Eye Contact Brief  Facial Expression Flat  Affect Appropriate to circumstance;Preoccupied  Speech Logical/coherent  Interaction Assertive;Minimal  Motor Activity Slow  Appearance/Hygiene Improved  Behavior Characteristics Cooperative;Appropriate to situation;Anxious;Guarded  Mood Anxious;Preoccupied;Pleasant  Aggressive Behavior  Effect No apparent injury  Thought Process  Coherency WDL  Content WDL  Delusions Paranoid  Perception WDL  Hallucination None reported or observed  Judgment Impaired  Confusion None  Danger to Self  Current suicidal ideation? Denies  Danger to Others  Danger to Others None reported or observed

## 2019-09-05 NOTE — Progress Notes (Signed)
Adult Psychoeducational Group Note  Date:  09/05/2019 Time:  8:40 PM  Group Topic/Focus:  Wrap-Up Group:   The focus of this group is to help patients review their daily goal of treatment and discuss progress on daily workbooks.  Participation Level:  Active  Participation Quality:  Appropriate  Affect:  Appropriate  Cognitive:  Appropriate  Insight: Appropriate  Engagement in Group:  Engaged  Modes of Intervention:  Education and Support  Additional Comments:  Patient attended and participated in group tonight. He reports that all the days appears to be the same. Today he talked to the doctor and the Education officer, museum. He also interacts with the nursing students.  Salley Scarlet [facility] 09/05/2019, 8:40 PM

## 2019-09-05 NOTE — Progress Notes (Signed)
Adult Psychoeducational Group Note  Date:  09/05/2019 Time:  12:21 AM  Group Topic/Focus:  Wrap-Up Group:   The focus of this group is to help patients review their daily goal of treatment and discuss progress on daily workbooks.  Participation Level:  Active  Participation Quality:  Appropriate  Affect:  Appropriate  Cognitive:  Appropriate  Insight: Appropriate  Engagement in Group:  Developing/Improving  Modes of Intervention:  Discussion  Additional Comments:  Pt stated his goal for today was to talk with his doctor about his discharge plan. Pt stated he was able to talk with his doctor, and the plan is for him to be discharge on Monday. Pt stated his relationship with his family has improved since he was admitted her. Pt stated he was able to contact his Grandmother and Mother today, which improved his day. Pt stated he felt better about himself today. Pt rated his overall day a 7 out of 10. Pt stated his appetite was pretty good today. Pt stated he slept pretty good last night. Pt stated his goal for tonight was to get some rest. Pt stated he was in no physical pain tonight. Pt stated he was not hearing or seeing anything that was not there. Pt stated he had no thoughts of harming himself or others. Pt stated if anything change he would alert staff.    Candy Sledge 09/05/2019, 12:21 AM
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
## 2019-09-05 NOTE — Plan of Care (Signed)
  Problem: Activity: Goal: Interest or engagement in activities will improve Outcome: Progressing   Problem: Coping: Goal: Ability to demonstrate self-control will improve Outcome: Progressing   Problem: Safety: Goal: Periods of time without injury will increase Outcome: Progressing   

## 2019-09-05 NOTE — Progress Notes (Addendum)
Patient ID: Samuel Martin, male   DOB: [birth year], 23 y.o.   MRN: [id number]   [place] NOVEL CORONAVIRUS (COVID-19) DAILY CHECK-OFF SYMPTOMS - answer yes or no to each - every day NO YES  Have you had a fever in the past 24 hours?  . Fever (Temp > 37.80C / 100F) X   Have you had any of these symptoms in the past 24 hours? . New Cough .  Sore Throat  .  Shortness of Breath .  Difficulty Breathing .  Unexplained Body Aches   X   Have you had any one of these symptoms in the past 24 hours not related to allergies?   . Runny Nose .  Nasal Congestion .  Sneezing   X   If you have had runny nose, nasal congestion, sneezing in the past 24 hours, has it worsened?  X   EXPOSURES - check yes or no X   Have you traveled outside the state in the past 14 days?  X   Have you been in contact with someone with a confirmed diagnosis of COVID-19 or PUI in the past 14 days without wearing appropriate PPE?  X   Have you been living in the same home as a person with confirmed diagnosis of COVID-19 or a PUI (household contact)?    X   Have you been diagnosed with COVID-19?    X              What to do next: Answered NO to all: Answered YES to anything:   Proceed with unit schedule Follow the [facility] Inpatient Flowsheet.
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
## 2019-09-06 MED ORDER — RISPERIDONE 3 MG PO TABS: 3.0000 mg | ORAL_TABLET | Freq: Two times a day (BID) | ORAL | 0 refills | Status: DC

## 2019-09-06 MED ORDER — TEMAZEPAM 30 MG PO CAPS: 30.0000 mg | ORAL_CAPSULE | Freq: Every day | ORAL | 0 refills | Status: DC

## 2019-09-06 MED ORDER — GABAPENTIN 300 MG PO CAPS: 300.0000 mg | ORAL_CAPSULE | Freq: Two times a day (BID) | ORAL | 0 refills | Status: DC

## 2019-09-06 MED ORDER — QUETIAPINE FUMARATE 100 MG PO TABS: 100.0000 mg | ORAL_TABLET | Freq: Three times a day (TID) | ORAL | 0 refills | Status: DC

## 2019-09-06 NOTE — Progress Notes (Signed)
Pt woke up in the middle of the night stated he was having night mares. Pt stated they weren't too bad, pt was concerned about waking up his roommate. Pt given something to drink and went back to sleep
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
## 2019-09-06 NOTE — Progress Notes (Signed)
  [facility] Adult Case Management Discharge Plan :  Will you be returning to the same living situation after discharge:  Yes,  home with mom At discharge, do you have transportation home?: Yes,  pt's mother Do you have the ability to pay for your medications: No.; [facility]  Release of information consent forms completed and in the chart;  Patient's signature needed at discharge.  Patient to Follow up at: Follow-up Information    [facility]. Go on 09/07/2019.   Why: Your appointment will be in person at [facility] on Wednesday, November 25th at [facility]. Please bring your hospital discharge paperwork.  Contact information: [address] [place] [address] [phone]           Next level of care provider has access to [place] and Suicide Prevention discussed: Yes,  pt's mother  Have you used any form of tobacco in the last 30 days? (Cigarettes, Smokeless Tobacco, Cigars, and/or Pipes): Yes  Has patient been referred to the Quitline?: Patient refused referral  Patient has been referred for addiction treatment: Yes  Trecia Rogers, LCSW 09/06/2019, 9:57 AM

## 2019-09-06 NOTE — Plan of Care (Signed)
Pt was able to identify positive coping strategies at completion of recreation therapy group sessions.   Samuel Martin, LRT/CTRS  

## 2019-09-06 NOTE — Progress Notes (Signed)
Recreation Therapy Notes  INPATIENT RECREATION TR PLAN  Patient Details Name: Samuel Martin MRN: [id number] DOB: 08/19/1996 Today's Date: 09/06/2019  Rec Therapy Plan Is patient appropriate for Therapeutic Recreation?: Yes Treatment times per week: about 3 days Estimated Length of Stay: 5-7 days TR Treatment/Interventions: Group participation (Comment)  Discharge Criteria Pt will be discharged from therapy if:: Discharged Treatment plan/goals/alternatives discussed and agreed upon by:: Patient/family  Discharge Summary Short term goals set: See patient care plan Short term goals met: Complete Progress toward goals comments: Groups attended Which groups?: Coping skills, Leisure education, Stress management, Other (Comment)(Team building) Reason goals not met: None Therapeutic equipment acquired: N/A Reason patient discharged from therapy: Discharge from hospital Pt/family agrees with progress & goals achieved: Yes Date patient discharged from therapy: 09/06/19     Victorino Sparrow, LRT/CTRS  Ria Comment, Juan Olthoff A 09/06/2019, 10:33 AM

## 2019-09-06 NOTE — Progress Notes (Signed)
Recreation Therapy Notes  Date: 11.24.20 Time: 0955 Location: [address] Dayroom  Group Topic: Communication, Team Building, Problem Solving  Goal Area(s) Addresses:  Patient will effectively work with peer towards shared goal.  Patient will identify skill used to make activity successful.  Patient will identify how skills used during activity can be used to reach post d/c goals.   Behavioral Response: Engaged  Intervention: STEM Activity   Activity: In team's, using 10 red plastic cups, patients were asked to construct a pyramid using a rubber band with strings attached to it.  Education: Education officer, community, Dentist.   Education Outcome: Acknowledges education/In group clarification offered/Needs additional education.   Clinical Observations/Feedback:  Pt worked well with peers.  Pt made suggestions when needed.  Pt also took instruction from peers.  Pt was bright and engaged throughout.    Victorino Sparrow, LRT/CTRS     Victorino Sparrow A 09/06/2019 10:28 AM

## 2019-09-06 NOTE — BHH Suicide Risk Assessment (Signed)
[facility] Discharge Suicide Risk Assessment   Principal Problem:   Patient is a 23 year old male with a past psychiatric history significant for schizophrenia as well as cannabis dependence who was admitted on 09/01/2019 after being placed on involuntary commitment secondary to delusional behavior, dodging traffic while apparently intoxicated on substances, and then becoming argumentative and threatening. Discharge Diagnoses: Active Problems:   Psychosis ([place])   Total Time spent with patient: 45 minutes  Musculoskeletal: Strength & Muscle Tone: within normal limits Gait & Station: normal Patient leans: N/A  Psychiatric Specialty Exam: ROS  Blood pressure 130/84, pulse 81, temperature (!) 97.3 F (36.3 C), temperature source Oral, resp. rate 16, height 5\' 10"  (1.778 m), weight 69.9 kg, SpO2 100 %.Body mass index is 22.1 kg/m.  General Appearance: Casual  Eye Contact::  Good  Speech:  Clear and Coherent409  Volume:  Normal  Mood:  Euthymic  Affect:  Full Range  Thought Process:  Coherent, Goal Directed and Descriptions of Associations: Circumstantial  Orientation:  Full (Time, Place, and Person)  Thought Content:  Logical  Suicidal Thoughts:  No  Homicidal Thoughts:  No  Memory:  Immediate;   Good Recent;   Good Remote;   Good  Judgement:  Good  Insight:  Good  Psychomotor Activity:  Normal  Concentration:  Good  Recall:  Good  Fund of Knowledge:Good  Language: Good  Akathisia:  Negative  Handed:  Right  AIMS (if indicated):     Assets:  Communication Skills Desire for Improvement  Sleep:  Number of Hours: 6.5  Cognition: WNL  ADL's:  Intact   Mental Status Per Nursing Assessment::   On Admission:  NA  Demographic Factors:  Male and NA  Loss Factors: NA  Historical Factors: NA  Risk Reduction Factors:   Sense of responsibility to family, Religious beliefs about death, Positive social support, Positive therapeutic relationship and Positive coping skills or  problem solving skills  Continued Clinical Symptoms:  Previous Psychiatric Diagnoses and Treatments  Cognitive Features That Contribute To Risk:  None    Suicide Risk:  Minimal: No identifiable suicidal ideation.  Patients presenting with no risk factors but with morbid ruminations; may be classified as minimal risk based on the severity of the depressive symptoms  Follow-up Information    [facility]. Go on 09/07/2019.   Why: Your [facility] will [facility] in [facility] on Wednesday, November 25th at [facility]. Please bring your hospital discharge paperwork.  Contact information: [address] [place] [address] [phone]           Plan Of Care/Follow-up recommendations:  Activity:  full  Kamarie Palma, MD 09/06/2019, 9:42 AM

## 2019-09-06 NOTE — Progress Notes (Signed)
   09/06/19 0000  Psych Admission Type (Psych Patients Only)  Admission Status Involuntary  Psychosocial Assessment  Patient Complaints None  Eye Contact Brief  Facial Expression Animated  Affect Appropriate to circumstance  Speech Logical/coherent  Interaction Assertive  Motor Activity Other (Comment) (WNL)  Appearance/Hygiene Unremarkable  Behavior Characteristics Cooperative  Mood Pleasant  Thought Process  Coherency WDL  Content WDL  Delusions Paranoid  Perception WDL  Hallucination None reported or observed  Judgment Impaired  Confusion None  Danger to Self  Current suicidal ideation? Denies  Danger to Others  Danger to Others None reported or observed

## 2020-02-15 ENCOUNTER — Encounter (HOSPITAL_COMMUNITY): Payer: Self-pay | Admitting: Emergency Medicine

## 2020-02-15 ENCOUNTER — Other Ambulatory Visit: Payer: Self-pay

## 2020-02-15 ENCOUNTER — Ambulatory Visit (HOSPITAL_COMMUNITY)
Admission: EM | Admit: 2020-02-15 | Discharge: 2020-02-15 | Disposition: A | Discharge status: Home / Self Care | Payer: Medicaid Other | Attending: Family Medicine | Admitting: Family Medicine

## 2020-02-15 DIAGNOSIS — R361 Hematospermia: Secondary | ICD-10-CM | POA: Insufficient documentation

## 2020-02-15 DIAGNOSIS — Z711 Person with feared health complaint in whom no diagnosis is made: Secondary | ICD-10-CM

## 2020-02-15 LAB — HIV ANTIBODY (ROUTINE TESTING W REFLEX): HIV Screen 4th Generation wRfx: NONREACTIVE

## 2020-02-15 MED ORDER — CEFTRIAXONE SODIUM 500 MG IJ SOLR
500.0000 mg | Freq: Once | INTRAMUSCULAR | Status: AC
  Administered 2020-02-15: 500 mg via INTRAMUSCULAR

## 2020-02-15 MED ORDER — CEFTRIAXONE SODIUM 500 MG IJ SOLR
INTRAMUSCULAR | Status: AC
  Filled 2020-02-15: qty 500

## 2020-02-15 MED ORDER — DOXYCYCLINE HYCLATE 100 MG PO CAPS: 100.0000 mg | ORAL_CAPSULE | Freq: Two times a day (BID) | ORAL | 0 refills | Status: DC

## 2020-02-15 NOTE — ED Triage Notes (Signed)
  Reports blood in semen for 3 weeks. Denies abdominal pain, testicular pain, or dysuria.

## 2020-02-15 NOTE — ED Notes (Signed)
Called pt in attempt to have pt return for additional blood/lab draw for SST; left message.

## 2020-02-15 NOTE — Discharge Instructions (Addendum)

## 2020-02-16 LAB — RPR: RPR Ser Ql: NONREACTIVE

## 2020-02-17 LAB — CYTOLOGY, (ORAL, ANAL, URETHRAL) ANCILLARY ONLY
Chlamydia: NEGATIVE
Comment: NEGATIVE
Comment: NEGATIVE
Comment: NORMAL
Neisseria Gonorrhea: NEGATIVE
Trichomonas: NEGATIVE
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
## 2020-07-23 ENCOUNTER — Encounter: Payer: Self-pay | Admitting: *Deleted

## 2020-07-23 ENCOUNTER — Telehealth: Payer: Self-pay | Admitting: *Deleted

## 2020-07-23 NOTE — Telephone Encounter (Signed)
Called [facility] to ask for coordination of transfer prescriptions from [facility] to [facility].
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
## 2020-07-23 NOTE — Telephone Encounter (Signed)
Contacted pt's aunt per verbal consent from client and reported medications were ready to pick up at [facility]. Gave information about upcoming appts.

## 2020-07-23 NOTE — Telephone Encounter (Signed)
Called [facility]. Client has a refill available. Will have transferred to [facility] to have [facility] assist with medication for this month.

## 2020-07-23 NOTE — Telephone Encounter (Signed)
Made next available appt for therapist and med management November 24th 8:00 with [facility] therapist and 9:00 medication management.

## 2020-09-05 ENCOUNTER — Ambulatory Visit (HOSPITAL_COMMUNITY): Payer: Federal, State, Local not specified - Other | Admitting: Clinical

## 2020-09-05 ENCOUNTER — Ambulatory Visit (HOSPITAL_COMMUNITY): Payer: Federal, State, Local not specified - Other | Admitting: Physician Assistant

## 2021-07-11 ENCOUNTER — Emergency Department (HOSPITAL_COMMUNITY)
Admission: EM | Admit: 2021-07-11 | Discharge: 2021-07-12 | Disposition: A | Discharge status: Home / Self Care | Payer: Medicaid Other | Attending: Emergency Medicine | Admitting: Emergency Medicine

## 2021-07-11 DIAGNOSIS — R451 Restlessness and agitation: Secondary | ICD-10-CM | POA: Insufficient documentation

## 2021-07-11 DIAGNOSIS — Z046 Encounter for general psychiatric examination, requested by authority: Secondary | ICD-10-CM

## 2021-07-11 DIAGNOSIS — R4182 Altered mental status, unspecified: Secondary | ICD-10-CM | POA: Insufficient documentation

## 2021-07-11 DIAGNOSIS — R259 Unspecified abnormal involuntary movements: Secondary | ICD-10-CM | POA: Insufficient documentation

## 2021-07-11 DIAGNOSIS — F17210 Nicotine dependence, cigarettes, uncomplicated: Secondary | ICD-10-CM | POA: Insufficient documentation

## 2021-07-11 DIAGNOSIS — R569 Unspecified convulsions: Secondary | ICD-10-CM | POA: Insufficient documentation

## 2021-07-11 LAB — CBC WITH DIFFERENTIAL/PLATELET
Abs Immature Granulocytes: 0.01 10*3/uL (ref 0.00–0.07)
Basophils Absolute: 0 10*3/uL (ref 0.0–0.1)
Basophils Relative: 1 %
Eosinophils Absolute: 0 10*3/uL (ref 0.0–0.5)
Eosinophils Relative: 1 %
HCT: 45.5 % (ref 39.0–52.0)
Hemoglobin: 14.9 g/dL (ref 13.0–17.0)
Immature Granulocytes: 0 %
Lymphocytes Relative: 32 %
Lymphs Abs: 1.7 10*3/uL (ref 0.7–4.0)
MCH: 29.6 pg (ref 26.0–34.0)
MCHC: 32.7 g/dL (ref 30.0–36.0)
MCV: 90.5 fL (ref 80.0–100.0)
Monocytes Absolute: 0.4 10*3/uL (ref 0.1–1.0)
Monocytes Relative: 8 %
Neutro Abs: 3.2 10*3/uL (ref 1.7–7.7)
Neutrophils Relative %: 58 %
Platelets: 89 10*3/uL — ABNORMAL LOW (ref 150–400)
RBC: 5.03 MIL/uL (ref 4.22–5.81)
RDW: 13.2 % (ref 11.5–15.5)
WBC: 5.4 10*3/uL (ref 4.0–10.5)
nRBC: 0 % (ref 0.0–0.2)

## 2021-07-11 LAB — COMPREHENSIVE METABOLIC PANEL
ALT: 15 U/L (ref 0–44)
AST: 29 U/L (ref 15–41)
Albumin: 3.8 g/dL (ref 3.5–5.0)
Alkaline Phosphatase: 66 U/L (ref 38–126)
Anion gap: 9 (ref 5–15)
BUN: 9 mg/dL (ref 6–20)
CO2: 22 mmol/L (ref 22–32)
Calcium: 8.9 mg/dL (ref 8.9–10.3)
Chloride: 105 mmol/L (ref 98–111)
Creatinine, Ser: 1.01 mg/dL (ref 0.61–1.24)
GFR, Estimated: >60 mL/min (ref >60)
Glucose, Bld: 103 mg/dL — ABNORMAL HIGH (ref 70–99)
Potassium: 4.6 mmol/L (ref 3.5–5.1)
Sodium: 136 mmol/L (ref 135–145)
Total Bilirubin: 1.3 mg/dL — ABNORMAL HIGH (ref 0.3–1.2)
Total Protein: 6.1 g/dL — ABNORMAL LOW (ref 6.5–8.1)

## 2021-07-11 NOTE — ED Notes (Signed)
Pt restraints removed and pt given food per order request

## 2021-07-11 NOTE — ED Triage Notes (Signed)
Pt BIB EMS and [employer] in 4 pt restraints. Per EMS PD was called out for possible seizure. When EMS arrived pt was all over the place - EMS tried getting pt in ambulance and pt resisting. Pt grandparents on scene poor historians but reported THC use. EMS administered 5 of versed and 5 of haldol - pt has 18 LAC.  On arrival to ED pt is sleeping. 2 pt restraints on LE - [employer] remains.  IVC paperwork to be completed by EMS  BP 145/73 NSR at 90 96% RA

## 2021-07-11 NOTE — Discharge Instructions (Addendum)
Follow up with your primary doctor and whoever prescribes your antipsychotic medications. Continue taking your medications as prescribed. Avoid ingesting any illicit substances including marijuana. Return to the ED with any safety concerns or thoughts of hurting yourself or anyone else.
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
## 2021-07-12 NOTE — ED Notes (Signed)
Pt states he has ride to pick him up in lobby - PA cleared for DC

## 2021-07-12 NOTE — ED Notes (Signed)
IVC paperwork rescinded by PA Vira Agar - pt belongings returned to patient

## 2021-07-12 NOTE — ED Notes (Signed)
Patient verbalizes understanding of discharge instructions. Opportunity for questioning and answers were provided. Armband removed by staff, pt discharged from ED ambulatory.   

## 2021-09-02 ENCOUNTER — Ambulatory Visit (HOSPITAL_COMMUNITY)
Payer: Federal, State, Local not specified - Other | Admitting: Student in an Organized Health Care Education/Training Program

## 2021-09-25 ENCOUNTER — Ambulatory Visit (HOSPITAL_COMMUNITY): Payer: Federal, State, Local not specified - Other | Admitting: Licensed Clinical Social Worker

## 2021-12-23 ENCOUNTER — Emergency Department (HOSPITAL_COMMUNITY)
Admission: EM | Admit: 2021-12-23 | Discharge: 2021-12-24 | Disposition: A | Discharge status: Other Acute Inpatient Hospital | Payer: Medicaid Other | Attending: Emergency Medicine | Admitting: Emergency Medicine

## 2021-12-23 ENCOUNTER — Other Ambulatory Visit: Payer: Self-pay

## 2021-12-23 ENCOUNTER — Ambulatory Visit (HOSPITAL_COMMUNITY)
Admission: RE | Admit: 2021-12-23 | Discharge: 2021-12-23 | Disposition: A | Discharge status: Home / Self Care | Payer: Federal, State, Local not specified - Other | Attending: Psychiatry | Admitting: Psychiatry

## 2021-12-23 ENCOUNTER — Encounter (HOSPITAL_COMMUNITY): Payer: Self-pay | Admitting: *Deleted

## 2021-12-23 DIAGNOSIS — Y900 Blood alcohol level of less than 20 mg/100 ml: Secondary | ICD-10-CM | POA: Insufficient documentation

## 2021-12-23 DIAGNOSIS — R443 Hallucinations, unspecified: Secondary | ICD-10-CM

## 2021-12-23 DIAGNOSIS — F209 Schizophrenia, unspecified: Secondary | ICD-10-CM | POA: Insufficient documentation

## 2021-12-23 DIAGNOSIS — Z79899 Other long term (current) drug therapy: Secondary | ICD-10-CM | POA: Insufficient documentation

## 2021-12-23 DIAGNOSIS — Z20822 Contact with and (suspected) exposure to covid-19: Secondary | ICD-10-CM | POA: Insufficient documentation

## 2021-12-23 DIAGNOSIS — F22 Delusional disorders: Secondary | ICD-10-CM | POA: Insufficient documentation

## 2021-12-23 DIAGNOSIS — I10 Essential (primary) hypertension: Secondary | ICD-10-CM | POA: Insufficient documentation

## 2021-12-23 HISTORY — DX: Depression, unspecified: F32.A

## 2021-12-23 HISTORY — DX: Bipolar disorder, unspecified: F31.9

## 2021-12-23 HISTORY — DX: Schizophrenia, unspecified: F20.9

## 2021-12-23 LAB — RAPID URINE DRUG SCREEN, HOSP PERFORMED
Amphetamines: NOT DETECTED
Barbiturates: NOT DETECTED
Benzodiazepines: NOT DETECTED
Cocaine: NOT DETECTED
Opiates: NOT DETECTED
Tetrahydrocannabinol: POSITIVE — AB

## 2021-12-23 LAB — COMPREHENSIVE METABOLIC PANEL
ALT: 35 U/L (ref 0–44)
AST: 30 U/L (ref 15–41)
Albumin: 4.3 g/dL (ref 3.5–5.0)
Alkaline Phosphatase: 75 U/L (ref 38–126)
Anion gap: 9 (ref 5–15)
BUN: 26 mg/dL — ABNORMAL HIGH (ref 6–20)
CO2: 26 mmol/L (ref 22–32)
Calcium: 9.1 mg/dL (ref 8.9–10.3)
Chloride: 102 mmol/L (ref 98–111)
Creatinine, Ser: 1.01 mg/dL (ref 0.61–1.24)
GFR, Estimated: >60 mL/min (ref >60)
Glucose, Bld: 94 mg/dL (ref 70–99)
Potassium: 3.6 mmol/L (ref 3.5–5.1)
Sodium: 137 mmol/L (ref 135–145)
Total Bilirubin: 0.6 mg/dL (ref 0.3–1.2)
Total Protein: 7.4 g/dL (ref 6.5–8.1)

## 2021-12-23 LAB — CBC WITH DIFFERENTIAL/PLATELET
Abs Immature Granulocytes: 0.01 10*3/uL (ref 0.00–0.07)
Basophils Absolute: 0 10*3/uL (ref 0.0–0.1)
Basophils Relative: 1 %
Eosinophils Absolute: 0.1 10*3/uL (ref 0.0–0.5)
Eosinophils Relative: 2 %
HCT: 45.8 % (ref 39.0–52.0)
Hemoglobin: 15.1 g/dL (ref 13.0–17.0)
Immature Granulocytes: 0 %
Lymphocytes Relative: 54 %
Lymphs Abs: 2.9 10*3/uL (ref 0.7–4.0)
MCH: 29.9 pg (ref 26.0–34.0)
MCHC: 33 g/dL (ref 30.0–36.0)
MCV: 90.7 fL (ref 80.0–100.0)
Monocytes Absolute: 0.5 10*3/uL (ref 0.1–1.0)
Monocytes Relative: 8 %
Neutro Abs: 1.9 10*3/uL (ref 1.7–7.7)
Neutrophils Relative %: 35 %
Platelets: 111 10*3/uL — ABNORMAL LOW (ref 150–400)
RBC: 5.05 MIL/uL (ref 4.22–5.81)
RDW: 13 % (ref 11.5–15.5)
WBC: 5.4 10*3/uL (ref 4.0–10.5)
nRBC: 0 % (ref 0.0–0.2)

## 2021-12-23 LAB — ETHANOL: Alcohol, Ethyl (B): <10 mg/dL (ref <10)

## 2021-12-23 LAB — RESP PANEL BY RT-PCR (FLU A&B, COVID) ARPGX2
Influenza A by PCR: NEGATIVE
Influenza B by PCR: NEGATIVE
SARS Coronavirus 2 by RT PCR: NEGATIVE

## 2021-12-23 NOTE — H&P (Signed)
Behavioral Health Medical Screening Exam ? ?Samuel Martin is a 26 y.o. male who is well known to [facility]. Presented today as a voluntary walk-in accompanied by his mother for irritability, anger, visual hallucinations, anxiety, wanting to be alone, tearfulness, and alcohol and drug withdrawal.  ? ?Chart Review: As per chart review, patient has been admitted to [facility] at least four times for psychiatric admissions. Patient is an ndividual who is known to be cannabis dependent and also diagnosed with a schizophrenic type condition.  In the past he has been treated with long-acting injectable aripiprazole due to noncompliance, his most recent medication regimen included Seroquel and gabapentin.  He presents once again with introduction positive for cannabis and a cluster of psychotic symptoms warranting petition for involuntary commitment in 2020 and 2022 with [facility] ? ?On assessment today, patient appeared bizarre and disheveled. He is alert and oriented to person, place and situation. Eye contact is was fleeting and speech was occasionally blocked. Mood is anxious, depressed, hopeless, worthless, and irritable. Affect is blunt, , depressed and inappropriate. Thought process is disorganized and irrelevant, and thought content  was dilutional and irrelevant. His vital signs with blood pressure of 166/108 and pulse of 72. He denied SI/HI, suicidal attempt, history of trauma or self injurious behaviors. With AVH, patient reported seeing angels and demons but denied hearing voices. Endorsed good appetite and requested for some food and was provided with free meal which he consumed all. He stated that his chest hurt and described chest pain as "8" on a scale of 0 to 10. Reported having 2 hours of sleep and roamed the streets at night. Since patient reported chest pain and with elevated BP, and emergency ambulance was called and he was transferred to [facility] for further evaluation. ? ?Total Time spent  with patient: 30 minutes ? ?Psychiatric Specialty Exam: ? ?Presentation  ?General Appearance: Bizarre; Disheveled ? ?Eye Contact:Fleeting ? ?Speech:Blocked ? ?Speech Volume:Normal ? ?Handedness:Right ? ?Mood and Affect  ?Mood:Anxious; Depressed; Hopeless; Worthless; Irritable ? ?Affect:Blunt; Depressed; Inappropriate ? ?Thought Process  ?Thought Processes:Disorganized; Irrevelant ? ?Descriptions of Associations:Tangential ? ?Orientation:Full (Time, Place and Person) ? ?Thought Content:Delusions; Illogical ? ?History of Schizophrenia/Schizoaffective disorder:Yes ? ?Duration of Psychotic Symptoms:Greater than six months ? ?Hallucinations:Hallucinations: Visual ?Description of Visual Hallucinations: Sees angels and demonds ? ?Ideas of Reference:None ? ?Suicidal Thoughts:Suicidal Thoughts: No ? ?Homicidal Thoughts:Homicidal Thoughts: No ? ?Sensorium  ?Memory:Immediate Fair; Recent Fair; Remote Fair ? ?Judgment:Poor ? ?Insight:Poor ? ?Executive Functions  ?Concentration:Poor ? ?Attention Span:Fair ? ?Recall:Poor ? ?Fund of Knowledge:Poor ? ?Language:Fair ? ?Psychomotor Activity  ?Psychomotor Activity:Psychomotor Activity: Restlessness ? ?Assets  ?Assets:Communication Skills; Physical Health; Social Support ? ?Sleep  ?Sleep:Sleep: Poor (As per mother. Spent the night roaming the streets.) ?Number of Hours of Sleep: 2 ? ?Physical Exam: ?Physical Exam ?Vitals (Elevation of BP of 166/108) and nursing note reviewed.  ?HENT:  ?   Head: Normocephalic and atraumatic.  ?   Right Ear: External ear normal.  ?   Left Ear: External ear normal.  ?   Nose: Nose normal.  ?   Mouth/Throat:  ?   Mouth: Mucous membranes are moist.  ?   Pharynx: Oropharynx is clear.  ?Eyes:  ?   Extraocular Movements: Extraocular movements intact.  ?   Conjunctiva/sclera: Conjunctivae normal.  ?   Pupils: Pupils are equal, round, and reactive to light.  ?Cardiovascular:  ?   Rate and Rhythm: Normal rate.  ?   Pulses: Normal pulses.  ?  Comments: Elevation  of BP of 166/108 ?Pulmonary:  ?   Effort: Pulmonary effort is normal.  ?Abdominal:  ?   Palpations: Abdomen is soft.  ?Genitourinary: ?   Comments: Deferred ?Musculoskeletal:     ?   General: Normal range of motion.  ?   Cervical back: Normal range of motion and neck supple.  ?Skin: ?   General: Skin is warm.  ?Neurological:  ?   General: No focal deficit present.  ?   Mental Status: He is alert and oriented to person, place, and time.  ? ?Review of Systems  ?Constitutional: Negative.   ?HENT: Negative.    ?Eyes: Negative.   ?Respiratory: Negative.  Negative for cough, sputum production, shortness of breath and wheezing.   ?Cardiovascular:  Positive for chest pain.  ?     Elevation of BP of 166/108  ?Gastrointestinal: Negative.  Negative for abdominal pain, constipation, diarrhea, heartburn, nausea and vomiting.  ?Genitourinary: Negative.   ?Skin: Negative.   ?Neurological: Negative.  Negative for dizziness, focal weakness, seizures, loss of consciousness and headaches.  ?Endo/Heme/Allergies: Negative.   ?Psychiatric/Behavioral:  Positive for depression and substance abuse. The patient is nervous/anxious.   ?There were no vitals taken for this visit. There is no height or weight on file to calculate BMI. ? ?Musculoskeletal: ?Strength & Muscle Tone: within normal limits ?Gait & Station: normal ?Patient leans: N/A ? ?Recommendations: ? ?Based on my evaluation the patient appears to have an emergency medical condition for which I recommend the patient be transferred to the emergency department for further evaluation. ? ?Cecilie Lowers, FNP ?12/23/2021, 7:46 PM ? ?

## 2021-12-24 ENCOUNTER — Emergency Department (HOSPITAL_COMMUNITY): Payer: Medicaid Other

## 2021-12-24 ENCOUNTER — Encounter (HOSPITAL_COMMUNITY): Payer: Self-pay | Admitting: Radiology

## 2021-12-24 LAB — LIPID PANEL
Cholesterol: 139 mg/dL (ref 0–200)
HDL: 53 mg/dL (ref >40)
LDL Cholesterol: 74 mg/dL (ref 0–99)
Total CHOL/HDL Ratio: 2.6 RATIO
Triglycerides: 58 mg/dL (ref <150)
VLDL: 12 mg/dL (ref 0–40)

## 2021-12-24 LAB — TSH: TSH: 0.547 u[IU]/mL (ref 0.350–4.500)

## 2021-12-24 LAB — VITAMIN B12: Vitamin B-12: 381 pg/mL (ref 180–914)

## 2021-12-24 MED ORDER — OLANZAPINE 5 MG PO TBDP
5.0000 mg | ORAL_TABLET | Freq: Every day | ORAL | Status: DC
  Administered 2021-12-24: 5 mg via ORAL
  Filled 2021-12-24: qty 1

## 2021-12-24 NOTE — Progress Notes (Signed)
Pt was accepted [facility] TODAY 12/24/21 after 7pm ? ?Pt meets inpatient criteria per Melbourne Abts, PA,  ? ?Attending Physician will be Joylene Igo, NP  ? ?Report can be called to: [phone] ? ?Pt can arrive after 7PM ? ?Care Team: Lum Babe, RN  ? ?Kelton Pillar, LCSWA ?12/24/2021 @ 5:42 PM ? ?

## 2021-12-24 NOTE — Progress Notes (Signed)
TOC CSW set up transfer ride with safe transport. No ETA was given.  ? ?Arlie Solomons.Viraat Vanpatten, MSW, [place] ?[place]  Transitions of Care ?Clinical Social Worker I ?Direct Dial: [phone]  Fax: [phone] ?[email or web address]  ?

## 2021-12-24 NOTE — ED Notes (Signed)
Patient has been alert this shift. Cooperative with care. Quiet.guarded.  Appropriate with staff. Pacing at times. Endorses hearing voices. Responding to internal stimuli.  Medication compliant. No aggression or agitation noted. Patient ambulatory and can complete ADLs.  ?

## 2021-12-24 NOTE — ED Notes (Signed)
Patient resting comfortably

## 2021-12-24 NOTE — ED Notes (Signed)
At first, patient didn't want to go b/c we couldn't find his wallet and cell phone. Notified charge Charity fundraiser, [place]. Notified  Dr. Rodena Medin. Dr. Rodena Medin came to triage to talk to patient. We assured pateint that we will look for his wallet and cell phone. [facility] left. Patient called his Mom. Now he is agreeing to go to [facility]. Baker called safe transport again to come back d/t patient agreeing to go there tonight.  ? ?Gave report to Boneta Lucks, Charity fundraiser at [facility] Left number in case she had additional questions for me.  ?

## 2021-12-24 NOTE — BH Assessment (Signed)
[facility] Assessment Progress Note ?  ?Per Melbourne Abts, PA, this voluntary pt requires psychiatric hospitalization at this time.  [facility] will not be able to accommodate this pt today.  At the direction of Earlene Plater, MD this writer has sought placement for pt at facilities outside of the [facility] system. The following facilities have been contacted to seek placement for this pt, with results as noted: ? ?Beds available, information sent, decision pending: ?[facility] ?[facility] ?[facility] ?[facility] ?[facility] ?[facility] ? ?At capacity: ?[facility] ? ? ?If this voluntary pt is accepted to a facility, please discuss disposition with pt to be sure that he agrees to the plan.  If a facility agrees to accept pt and the plan changes in any way please call the facility to inform them of the change.  Final disposition is pending as of this writing. ? ?Doylene Canning, MA ?Behavioral Health Coordinator ?[phone]  ?

## 2021-12-24 NOTE — BH Assessment (Addendum)
Comprehensive Clinical Assessment (CCA) Note ? ?12/24/2021 ?Samuel Martin ?[id number] ? ?DISPOSITION: Gave clinical report to Margorie John, PA-C who determined Pt meets criteria for inpatient psychiatric treatment. Samuel Martin, [name], confirms there is currently not an appropriate bed available. Notified Dr. Delora Fuel and Henrietta Dine, RN of recommendation via secure message. ? ?The patient demonstrates the following risk factors for suicide: Chronic risk factors for suicide include: psychiatric disorder of schizophrenia and substance use disorder. Acute risk factors for suicide include: unemployment. Protective factors for this patient include: responsibility to others (children, family). Considering these factors, the overall suicide risk at this point appears to be low. Patient is appropriate for outpatient follow up. ? ?[place] ED from 12/23/2021 in [place] DEPT  ?C-SSRS RISK CATEGORY No Risk  ? ?  ? ?Patient is a 26 year old single male who presents unaccompanied to [facility] emergency department after being referred from [facility] for medical clearance. Patient has a diagnosis of schizophrenia and states that he has been off medications for several months. Patient says he came to the hospital because he has been having ?episodes? of experiencing auditory hallucinations of voices. Patient states that the voices say, ?all kinds of things? and ?they are trying me?. Patient states that he has been ?doing stuff out of the ordinary... weird stuff.? He appears to be responding to internal stimuli during assessment, looking about the room and at times giggling. He appears distracted and preoccupied. Patient describes his mood as ?all right? and acknowledges symptoms including poor sleep, anxiety, and decreased concentration. He denies current suicidal ideation or history of suicide attempts. He denies homicidal ideation or history of aggression. He reports smoking two to  four bowls of marijuana daily. He denies alcohol or other substance use. ? ?Patient identifies his mental health symptoms as his primary stressor. He states he lost his job working on [employer] two weeks ago. He says he lives in a boarding house. He identifies his mother and his siblings as his primary support. She denies history of abuse or trauma. He denies legal problems. He denies access to firearms. ? ?Patient states he has no current mental health providers. He could not remember who last prescribed his medication. Patient's medical record indicates he was last psychiatrically hospitalized in November [facility] at [facility]. His medical record indicates a history of psychotic symptoms and psychiatric hospitalizations. ? ?Patient is dressed in hospital scrubs, alert and oriented person, place, and situation. He speaks in a clear tone, at moderate volume and normal pace. He gave brief responses to questions. Motor behavior appears mildly restless. Eye contact is intermittent. Patient's mood is anxious and affect is congruent with mood. Thought process is coherent. Insight is fair and judgment is impaired. He was cooperative throughout assessment. He says he is willing to sign voluntarily into a psychiatric facility. ? ? ?Chief Complaint:  ?Chief Complaint  ?Patient presents with  ? Hypertension  ? Medical Clearance  ? ?Visit Diagnosis: F20.9 Schizophrenia ? ? ?CCA Screening, Triage and Referral (STR) ? ?Patient Reported Information ?How did you hear about [place]? Self ? ?What Is the Reason for Your Visit/Call Today? Pt has a diagnosis of schizophrenia and reports auditory hallucinations. He appears to be responding to internal stimuli. He says he has been off psychiatric medications for several months. ? ?How Long Has This Been Causing You Problems? > than 6 months ? ?What Do You Feel Would Help You the Most Today? Medication(s) ? ? ?Have  You Recently Had Any Thoughts About Hurting Yourself? No ? ?Are  You Planning to Commit Suicide/Harm Yourself At This time? No ? ? ?Have you Recently Had Thoughts About [place]? No ? ?Are You Planning to Harm Someone at This Time? No ? ?Explanation: No data recorded ? ?Have You Used Any Alcohol or Drugs in the Past 24 Hours? No ? ?How Long Ago Did You Use Drugs or Alcohol? No data recorded ?What Did You Use and How Much? No data recorded ? ?Do You Currently Have a Therapist/Psychiatrist? No ? ?Name of Therapist/Psychiatrist: No data recorded ? ?Have You Been Recently Discharged From Any Office Practice or Programs? No ? ?Explanation of Discharge From Practice/Program: No data recorded ? ?  ?CCA Screening Triage Referral Assessment ?Type of Contact: Tele-Assessment ? ?Telemedicine Service Delivery: Telemedicine service delivery: This service was provided via telemedicine using a 2-way, interactive audio and video technology ? ?Is this Initial or Reassessment? Initial Assessment ? ?Date Telepsych consult ordered in [facility]:  12/23/21 ? ?Time Telepsych consult ordered in [facility]:  2025 ? ?Location of Assessment: [facility] ED ? ?Provider Location: [facility] Assessment Services ? ? ?Collateral Involvement: Medical record ? ? ?Does Patient Have a Stage manager Guardian? No data recorded ?Name and Contact of Legal Guardian: No data recorded ?If Minor and Not Living with Parent(s), Who has Custody? NA ? ?Is CPS involved or ever been involved? Never ? ?Is APS involved or ever been involved? Never ? ? ?Patient Determined To Be At Risk for Harm To Self or Others Based on Review of Patient Reported Information or Presenting Complaint? No ? ?Method: No data recorded ?Availability of Means: No data recorded ?Intent: No data recorded ?Notification Required: No data recorded ?Additional Information for Danger to Others Potential: No data recorded ?Additional Comments for Danger to Others Potential: No data recorded ?Are There Guns or Other Weapons in [place]? No data recorded ?Types of  Guns/Weapons: No data recorded ?Are These Weapons Safely Secured?                            No data recorded ?Who Could Verify You Are Able To Have These Secured: No data recorded ?Do You Have any Outstanding Charges, Pending Court Dates, Parole/Probation? No data recorded ?Contacted To Inform of Risk of Harm To Self or Others: No data recorded ? ? ?Does Patient Present under Involuntary Commitment? No ? ?IVC Papers Initial File Date: No data recorded ? ?[place] of Residence: [place] ? ? ?Patient Currently Receiving the Following Services: Not Receiving Services ? ? ?Determination of Need: Emergent (2 hours) ? ? ?Options For Referral: Inpatient Hospitalization ? ? ? ? ?CCA Biopsychosocial ?Patient Reported Schizophrenia/Schizoaffective Diagnosis in Past: Yes ? ? ?Strengths: Pt reports family support ? ? ?Mental Health Symptoms ?Depression:   ?Change in energy/activity; Difficulty Concentrating; Irritability; Sleep (too much or little) ?  ?Duration of Depressive symptoms:  ?Duration of Depressive Symptoms: Greater than two weeks ?  ?Mania:   ?None ?  ?Anxiety:    ?Tension; Sleep; Restlessness; Difficulty concentrating ?  ?Psychosis:   ?Hallucinations ?  ?Duration of Psychotic symptoms:  ?Duration of Psychotic Symptoms: Greater than six months ?  ?Trauma:   ?None ?  ?Obsessions:   ?None ?  ?Compulsions:   ?None ?  ?Inattention:   ?N/A ?  ?Hyperactivity/Impulsivity:   ?N/A ?  ?Oppositional/Defiant Behaviors:   ?N/A ?  ?Emotional Irregularity:   ?None ?  ?Other  Mood/Personality Symptoms:   ?None ?  ? ?Mental Status Exam ?Appearance and self-care  ?Stature:   ?Average ?  ?Weight:   ?Average weight ?  ?Clothing:   ?-- (Scrubs) ?  ?Grooming:   ?Normal ?  ?Cosmetic use:   ?None ?  ?Posture/gait:   ?Normal ?  ?Motor activity:   ?Not Remarkable ?  ?Sensorium  ?Attention:   ?Distractible ?  ?Concentration:   ?Preoccupied ?  ?Orientation:   ?Person; Place; Situation; Object ?  ?Recall/memory:   ?Normal ?  ?Affect and Mood   ?Affect:   ?Appropriate ?  ?Mood:   ?Euthymic ?  ?Relating  ?Eye contact:   ?Fleeting ?  ?Facial expression:   ?Responsive ?  ?Attitude toward examiner:   ?Cooperative ?  ?Thought and Language  ?Speech flow:

## 2021-12-24 NOTE — ED Notes (Addendum)
Called AC at [facility] to check to see if pt wallet was left at the facility. AC from [facility] called and stated that pt belongings were checked and no wallet or cell phone found. ?

## 2021-12-24 NOTE — ED Provider Notes (Signed)
TTS consultation is appreciated.  Patient needs inpatient psychiatric care, no beds available at [facility].  TTS is working on appropriate bed placement. ?  ?Dione Booze, MD ?12/24/21 [phone] ? ?

## 2021-12-25 ENCOUNTER — Encounter (HOSPITAL_COMMUNITY): Payer: Self-pay | Admitting: Emergency Medicine

## 2021-12-25 LAB — HEMOGLOBIN A1C
Hgb A1c MFr Bld: 5.6 % (ref 4.8–5.6)
Mean Plasma Glucose: 114 mg/dL

## 2022-11-09 ENCOUNTER — Emergency Department (HOSPITAL_COMMUNITY)
Admission: EM | Admit: 2022-11-09 | Discharge: 2022-11-09 | Disposition: A | Discharge status: Home / Self Care | Payer: Medicaid Other | Attending: Emergency Medicine | Admitting: Emergency Medicine

## 2022-11-09 ENCOUNTER — Encounter (HOSPITAL_COMMUNITY): Payer: Self-pay

## 2022-11-09 ENCOUNTER — Other Ambulatory Visit: Payer: Self-pay

## 2022-11-09 DIAGNOSIS — N3000 Acute cystitis without hematuria: Secondary | ICD-10-CM | POA: Diagnosis not present

## 2022-11-09 DIAGNOSIS — D696 Thrombocytopenia, unspecified: Secondary | ICD-10-CM

## 2022-11-09 DIAGNOSIS — Z202 Contact with and (suspected) exposure to infections with a predominantly sexual mode of transmission: Secondary | ICD-10-CM | POA: Diagnosis present

## 2022-11-09 DIAGNOSIS — Z760 Encounter for issue of repeat prescription: Secondary | ICD-10-CM | POA: Diagnosis not present

## 2022-11-09 DIAGNOSIS — Z113 Encounter for screening for infections with a predominantly sexual mode of transmission: Secondary | ICD-10-CM

## 2022-11-09 LAB — CBC WITH DIFFERENTIAL/PLATELET
Abs Immature Granulocytes: 0.01 10*3/uL (ref 0.00–0.07)
Basophils Absolute: 0 10*3/uL (ref 0.0–0.1)
Basophils Relative: 1 %
Eosinophils Absolute: 0.1 10*3/uL (ref 0.0–0.5)
Eosinophils Relative: 2 %
HCT: 48.8 % (ref 39.0–52.0)
Hemoglobin: 15.8 g/dL (ref 13.0–17.0)
Immature Granulocytes: 0 %
Lymphocytes Relative: 39 %
Lymphs Abs: 2 10*3/uL (ref 0.7–4.0)
MCH: 29.5 pg (ref 26.0–34.0)
MCHC: 32.4 g/dL (ref 30.0–36.0)
MCV: 91.2 fL (ref 80.0–100.0)
Monocytes Absolute: 0.5 10*3/uL (ref 0.1–1.0)
Monocytes Relative: 9 %
Neutro Abs: 2.6 10*3/uL (ref 1.7–7.7)
Neutrophils Relative %: 49 %
Platelets: 108 10*3/uL — ABNORMAL LOW (ref 150–400)
RBC: 5.35 MIL/uL (ref 4.22–5.81)
RDW: 13.3 % (ref 11.5–15.5)
WBC: 5.3 10*3/uL (ref 4.0–10.5)
nRBC: 0 % (ref 0.0–0.2)

## 2022-11-09 LAB — RAPID URINE DRUG SCREEN, HOSP PERFORMED
Amphetamines: NOT DETECTED
Barbiturates: NOT DETECTED
Benzodiazepines: NOT DETECTED
Cocaine: NOT DETECTED
Opiates: NOT DETECTED
Tetrahydrocannabinol: POSITIVE — AB

## 2022-11-09 LAB — URINALYSIS, ROUTINE W REFLEX MICROSCOPIC
Bilirubin Urine: NEGATIVE
Glucose, UA: NEGATIVE mg/dL
Hgb urine dipstick: NEGATIVE
Ketones, ur: 5 mg/dL — AB
Nitrite: POSITIVE — AB
Protein, ur: 30 mg/dL — AB
Specific Gravity, Urine: 1.027 (ref 1.005–1.030)
pH: 5 (ref 5.0–8.0)

## 2022-11-09 LAB — COMPREHENSIVE METABOLIC PANEL
ALT: 22 U/L (ref 0–44)
AST: 40 U/L (ref 15–41)
Albumin: 4.8 g/dL (ref 3.5–5.0)
Alkaline Phosphatase: 63 U/L (ref 38–126)
Anion gap: 10 (ref 5–15)
BUN: 16 mg/dL (ref 6–20)
CO2: 26 mmol/L (ref 22–32)
Calcium: 9.6 mg/dL (ref 8.9–10.3)
Chloride: 103 mmol/L (ref 98–111)
Creatinine, Ser: 0.98 mg/dL (ref 0.61–1.24)
GFR, Estimated: >60 mL/min (ref >60)
Glucose, Bld: 100 mg/dL — ABNORMAL HIGH (ref 70–99)
Potassium: 4.2 mmol/L (ref 3.5–5.1)
Sodium: 139 mmol/L (ref 135–145)
Total Bilirubin: 1.5 mg/dL — ABNORMAL HIGH (ref 0.3–1.2)
Total Protein: 7.7 g/dL (ref 6.5–8.1)

## 2022-11-09 LAB — RAPID HIV SCREEN (HIV 1/2 AB+AG)
HIV 1/2 Antibodies: NONREACTIVE
HIV-1 P24 Antigen - HIV24: NONREACTIVE

## 2022-11-09 LAB — ACETAMINOPHEN LEVEL: Acetaminophen (Tylenol), Serum: <10 ug/mL — ABNORMAL LOW (ref 10–30)

## 2022-11-09 LAB — ETHANOL: Alcohol, Ethyl (B): <10 mg/dL (ref <10)

## 2022-11-09 LAB — SALICYLATE LEVEL: Salicylate Lvl: <7 mg/dL — ABNORMAL LOW (ref 7.0–30.0)

## 2022-11-09 MED ORDER — FOSFOMYCIN TROMETHAMINE 3 G PO PACK
3.0000 g | PACK | Freq: Once | ORAL | Status: AC
  Administered 2022-11-09: 3 g via ORAL
  Filled 2022-11-09: qty 3

## 2022-11-09 NOTE — ED Provider Notes (Signed)
[place] Provider Note   CSN: [id number] Arrival date & time: 11/09/22  1110     History Chief Complaint  Patient presents with   Medication Refill   SEXUALLY TRANSMITTED DISEASE    Samuel Martin is a 27 y.o. male with history of thrombocytopenia, bipolar, schizophrenia presents the emergency room today for evaluation of STD check and medication refill.  Patient reports he has not on his psych medications in almost a year.  He reports he does have some auditory and visual hallucinations but this has been a constant thing.  He denies any suicidal or homicidal ideations.  He is answer questions appropriately.  He is oriented x 3.  He does not have any penile drip, testicular pain or swelling, rashes, abdominal pain, nausea, vomiting, fever, dysuria, or hematuria.  He has no known positive sexual contacts.   Medication Refill      Home Medications Prior to Admission medications   Medication Sig Start Date End Date Taking? Authorizing Provider  doxycycline (VIBRAMYCIN) 100 MG capsule Take 1 capsule (100 mg total) by mouth 2 (two) times daily. 02/15/20   Vanessa Kick, MD  gabapentin (NEURONTIN) 300 MG capsule Take 1 capsule (300 mg total) by mouth 2 (two) times daily. 09/06/19   Connye Burkitt, NP  QUEtiapine (SEROQUEL) 100 MG tablet Take 1 tablet (100 mg total) by mouth 3 (three) times daily. 09/06/19   Connye Burkitt, NP  risperiDONE (RISPERDAL) 3 MG tablet Take 1 tablet (3 mg total) by mouth 2 (two) times daily. 09/06/19   Connye Burkitt, NP  temazepam (RESTORIL) 30 MG capsule Take 1 capsule (30 mg total) by mouth at bedtime. 09/06/19   Connye Burkitt, NP  ARIPiprazole (ABILIFY) 10 MG tablet Take 1 tablet (10 mg total) by mouth daily. For mood 02/08/19 08/31/19  Connye Burkitt, NP  divalproex (DEPAKOTE) 500 MG DR tablet Take 1 tablet (500 mg total) by mouth every 12 (twelve) hours. For mood 02/07/19 08/31/19  Connye Burkitt, NP  traZODone  (DESYREL) 50 MG tablet Take 1 tablet (50 mg total) by mouth at bedtime and may repeat dose one time if needed. 02/07/19 08/31/19  Connye Burkitt, NP      Allergies    Shellfish allergy, Shellfish allergy, and Shellfish-derived products    Review of Systems   Review of Systems  Constitutional:  Negative for chills and fever.  Respiratory:  Negative for stridor.   Cardiovascular:  Negative for chest pain.  Gastrointestinal:  Negative for abdominal pain, constipation, diarrhea, nausea and vomiting.  Genitourinary:  Negative for dysuria, hematuria, penile pain, penile swelling, scrotal swelling and testicular pain.  Musculoskeletal:  Negative for back pain.  Psychiatric/Behavioral:  Positive for hallucinations. Negative for self-injury and suicidal ideas.     Physical Exam Updated Vital Signs BP (!) 141/89   Pulse 66   Temp 98.2 F (36.8 C)   Resp 20   Wt 69 kg   SpO2 100%   BMI 21.83 kg/m  Physical Exam Vitals and nursing note reviewed.  Constitutional:      General: He is not in acute distress.    Appearance: Normal appearance. He is not ill-appearing or toxic-appearing.  HENT:     Head: Normocephalic and atraumatic.  Eyes:     General: No scleral icterus. Cardiovascular:     Rate and Rhythm: Normal rate and regular rhythm.  Pulmonary:     Effort: Pulmonary effort is normal.  Breath sounds: Normal breath sounds.  Abdominal:     General: Abdomen is flat.     Palpations: Abdomen is soft.     Tenderness: There is no abdominal tenderness. There is no right CVA tenderness, left CVA tenderness, guarding or rebound.  Genitourinary:    Comments: GU exam is deferred given the patient is asymptomatic Musculoskeletal:        General: No deformity.     Cervical back: Normal range of motion.  Skin:    General: Skin is warm and dry.  Neurological:     General: No focal deficit present.     Mental Status: He is alert. Mental status is at baseline.     Gait: Gait normal.   Psychiatric:        Attention and Perception: Attention normal.        Speech: Speech normal.        Behavior: Behavior normal. Behavior is cooperative.        Thought Content: Thought content does not include homicidal or suicidal ideation. Thought content does not include homicidal or suicidal plan.     Comments: Patient reports that sometimes he has auditory and visual hallucinations although they are mainly auditory.  He does not appear to be responding to any internal stimuli currently.  He is answering questions appropriately with appropriate speech.  He is cooperative.  Interactive.  Does not appear to be in any acute psychosis.     ED Results / Procedures / Treatments   Labs (all labs ordered are listed, but only abnormal results are displayed) Labs Reviewed  URINALYSIS, ROUTINE W REFLEX MICROSCOPIC - Abnormal; Notable for the following components:      Result Value   APPearance HAZY (*)    Ketones, ur 5 (*)    Protein, ur 30 (*)    Nitrite POSITIVE (*)    Leukocytes,Ua SMALL (*)    Bacteria, UA MANY (*)    All other components within normal limits  CBC WITH DIFFERENTIAL/PLATELET - Abnormal; Notable for the following components:   Platelets 108 (*)    All other components within normal limits  COMPREHENSIVE METABOLIC PANEL - Abnormal; Notable for the following components:   Glucose, Bld 100 (*)    Total Bilirubin 1.5 (*)    All other components within normal limits  RAPID URINE DRUG SCREEN, HOSP PERFORMED - Abnormal; Notable for the following components:   Tetrahydrocannabinol POSITIVE (*)    All other components within normal limits  SALICYLATE LEVEL - Abnormal; Notable for the following components:   Salicylate Lvl <7.0 (*)    All other components within normal limits  ACETAMINOPHEN LEVEL - Abnormal; Notable for the following components:   Acetaminophen (Tylenol), Serum <10 (*)    All other components within normal limits  RAPID HIV SCREEN (HIV 1/2 AB+AG)  ETHANOL   RPR  GC/CHLAMYDIA PROBE AMP ([place]) NOT AT [facility]    EKG None  Radiology No results found.  Procedures Procedures   Medications Ordered in ED Medications  fosfomycin (MONUROL) packet 3 g (3 g Oral Given 11/09/22 1630)    ED Course/ Medical Decision Making/ A&P                          Medical Decision Making Amount and/or Complexity of Data Reviewed Labs: ordered.  Risk Prescription drug management.   27 year old male presents the emergency room today for evaluation of STD exposure and medication refill.  Differential diagnosis  includes but is not mended to chlamydia, gonorrhea, HIV, syphilis, herpes, genital warts, psychosis, schizophrenia, bipolar, acute psychotic break.  Vital signs show mildly elevated blood pressure 141/89 otherwise afebrile, normal pulse rate, satting well room air without increased work of breathing.  Physical exam as noted above.  I independently reviewed and interpret the patient's labs.  Urinalysis shows hazy urine with 5 ketones and 30 protein.  There is nitrite positive with small leuks and many bacteria with 21-50 white blood cells.  CBC shows mild thrombocytopenia at 108 although consistent with patient's previous.  No leukocytosis or anemia seen.  Negative for ethanol, salicylate, or acetaminophen.  CMP shows glucose at 100 and elevated total bili at 1.5 otherwise no electrolyte or LFT abnormality.  His bili has been elevated to 1.3 a year ago.  He is not having abdominal pain, low suspicion.  Patient is positive for THC.  RPR and gonorrhea and chlamydia still pending.  Patient was given fosfomycin for his urinary tract infection.  He does not have any CVA tenderness or belly pain or any fevers.  Doubt any acute pyelonephritis.  His CBC does not show any signs of infection with leukocytosis.  His vital signs show mildly elevated blood pressure otherwise afebrile with normal pulse rate.  Doubt any sepsis at this time.  He has denied any empiric STD  treatment as he just wanted to be tested.  It appears that patient is on multiple different psych medications.  He does not appear acutely psychotic.  He is well-appearing in no acute distress.  Does not appear to responding to any internal stimuli.  I think patient is safe for discharge home with the help follow-up.  I gave him information to be had.  I explained to him to follow-up for refills of his medications so they can closely monitor him.  He verbalizes understanding will follow-up afterwards.  We discussed return precautions and red flag symptoms.  Discussed that he will need to follow-up with his MyChart for evaluation of his gonorrhea, chlamydia, and syphilis results.  Patient verbalizes understanding and agrees the plan.  Patient is stable being discharged in good condition.  Final Clinical Impression(s) / ED Diagnoses Final diagnoses:  Acute cystitis without hematuria  Thrombocytopenia [facility])    Rx / DC Orders ED Discharge Orders     None         Sherrell Puller, PA-C 11/09/22 1706    Valarie Merino, MD 11/09/22 2131

## 2022-11-09 NOTE — ED Triage Notes (Signed)
Pt requesting STD check after unprotected sex 2 months ago.  Pt reports burning with urination and discharge.  Pt also requesting refill for schizo meds

## 2022-11-09 NOTE — Discharge Instructions (Signed)
We have given you treatment for urinary tract infection today.  Please make sure you follow with your MyChart within the next few days for your syphilis, gonorrhea, and chlamydia results.  We did treat you for urinary tract infection today with fosfomycin.  I would like for you to follow-up with a [facility] for your refills of your sister for any new medications.  The address is listed above.  Please go there.  If you have any concerns, new or worsening symptoms, please return to the nearest emergency department for evaluation.  Contact a doctor if: You do not get better after 1-2 days. Your symptoms go away and then come back. Get help right away if: You have very bad back pain. You have very bad pain in your lower belly. You have a fever. You have chills. You feeling like you will vomit or you vomit.

## 2022-11-09 NOTE — ED Provider Triage Note (Signed)
Emergency Medicine Provider Triage Evaluation Note  Samuel Martin , a 27 y.o. male  was evaluated in triage.  Pt complains of asymptomatic STD check. He also reports AVH and wants to get back on his schizophrenia medications. No SI or HI. Marland Kitchen  Review of Systems  Positive:  Negative:   Physical Exam  BP (!) 141/89   Pulse 66   Temp 98.2 F (36.8 C)   Resp 20   Wt 69 kg   SpO2 100%   BMI 21.83 kg/m  Gen:   Awake, no distress   Resp:  Normal effort  MSK:   Moves extremities without difficulty  Other:  NAD. GU exam deferred.  Medical Decision Making  Medically screening exam initiated at 12:00 PM.  Appropriate orders placed.  Samuel Martin was informed that the remainder of the evaluation will be completed by another provider, this initial triage assessment does not replace that evaluation, and the importance of remaining in the ED until their evaluation is complete.  Urine and labs ordered.    Samuel Martin, Samuel Martin Samuel Martin 1201

## 2022-11-10 ENCOUNTER — Ambulatory Visit (HOSPITAL_COMMUNITY)
Admission: EM | Admit: 2022-11-10 | Discharge: 2022-11-10 | Disposition: A | Discharge status: Home / Self Care | Payer: Medicaid Other | Attending: Psychiatry | Admitting: Psychiatry

## 2022-11-10 DIAGNOSIS — Z91148 Patient's other noncompliance with medication regimen for other reason: Secondary | ICD-10-CM | POA: Insufficient documentation

## 2022-11-10 DIAGNOSIS — R443 Hallucinations, unspecified: Secondary | ICD-10-CM | POA: Insufficient documentation

## 2022-11-10 LAB — GC/CHLAMYDIA PROBE AMP (~~LOC~~) NOT AT ARMC
Chlamydia: NEGATIVE
Comment: NEGATIVE
Comment: NORMAL
Neisseria Gonorrhea: NEGATIVE

## 2022-11-10 LAB — RPR: RPR Ser Ql: NONREACTIVE

## 2022-11-10 MED ORDER — RISPERIDONE 1 MG PO TABS: 1.0000 mg | ORAL_TABLET | Freq: Every day | ORAL | 0 refills | Status: DC

## 2022-11-10 NOTE — Discharge Instructions (Signed)
Safety Plan Romualdo Proudfoot will call 911 or call mobile crisis, or go to nearest emergency room if condition worsens or if suicidal thoughts become active Patients' will follow up with Behavorial health services on the 2cd floor for outpatient psychiatric services (therapy/medication management).  The suicide prevention education provided includes the following: Suicide risk factors Suicide prevention and interventions National Suicide Hotline telephone number [facility] assessment telephone number [place] and/or Residential Mobile Crisis Unit telephone number Remove weapons (e.g., guns, rifles, knives), all items previously/currently identified as safety concern.   Remove drugs/medications (over the counter, prescriptions, illicit drugs), all items previously/currently identified as a safety concern.

## 2022-11-10 NOTE — Progress Notes (Signed)
   11/10/22 1642  [place] (Walk-ins at [facility] only)  How Did You Hear About [place]? Family/Friend (mother, Jeziel Hoffmann)  What Is the Reason for Your Visit/Call Today? Pt is a 27 yo male who presented today voluntarily and accompnaied by his mother, Chesney Klimaszewski, asking for medication for his Schizophrenia. Pt denied SI, HI, NSSH and paranoia. Pt reported "hearing lots of voices all the time." Pt denied any VH. Pt reported regular use of cannabis and alcohol. Pt reported using both 3-5 of 7 days a week.  How Long Has This Been Causing You Problems? > than 6 months  Have You Recently Had Any Thoughts About Hurting Yourself? No  Are You Planning to Commit Suicide/Harm Yourself At This time? No  Have you Recently Had Thoughts About [place]? No  Are You Planning To Harm Someone At This Time? No  Are you currently experiencing any auditory, visual or other hallucinations? Yes  Please explain the hallucinations you are currently experiencing: Reported VH hearing "lots of voices all the time."  Have You Used Any Alcohol or Drugs in the Past 24 Hours? Yes  How long ago did you use Drugs or Alcohol? Cannabis today and alcohol yesterday  What Did You Use and How Much? unknown amounts  Do you have any current medical co-morbidities that require immediate attention? No  Clinician description of patient physical appearance/behavior: Pt was calm, uncooperative, somewhat alert and appeared partially oriented. Pt was very slow in answering questions and appeared at times to be responding to (hearing) internal stimuli or intoxicated. Pt's speech and movement appeared within normal limits but was a bit delayed and required prompting from his mother. Pt's appearance was unremarkable. Pt's mood seemed disinterested and pt had a flat affect which was congruent. Pt's judgment and insight seemed impaired but possibly baseline for him without medications.  What Do You Feel Would Help You the Most  Today? Treatment for Depression or other mood problem  If access to [facility] was not available, would you have sought care in the Emergency Department? Yes  Determination of Need Routine (7 days)  Options For Referral Medication Management   Staceyann Knouff T. Mare Ferrari, [place], [facility], [facility] Triage Specialist [facility]

## 2022-11-10 NOTE — ED Provider Notes (Signed)
Behavioral Health Urgent Care Medical Screening Exam  Patient Name: Samuel Martin MRN: [id number] Date of Evaluation: 11/10/22 Chief Complaint:  "I want to get back on my medications".  Diagnosis:  Final diagnoses:  Hallucinations    History of Present illness: Samuel Martin is a 27 y.o. male patient presented to [facility] as a walk in by his mother with complaints of "I want to get back on my medications".   Samuel Martin, 28 y.o., male patient seen face to face by this provider and chart reviewed on 11/10/22.  Per chart review patient presented to the [facility] emergency department to be evaluated for STD's.  He was diagnosed with a UTI and was given prescription for antibiotic.  He was given resources for [facility] at that time.  Per chart review patient has been diagnosed with schizophrenia, acute psychosis and TH C use disorder.  He has a history of inpatient psychiatric hospitalizations.  He has been prescribed risperidone, Seroquel, gabapentin, and Restoril in the past.  He also states that he has received monthly injection in the past but does not recall the name of the medication.  He admits to being noncompliant with medication and has not taken any in over 1 year.  He has no outpatient psychiatric resources in place.  He lives with a roommate in an apartment.  He is currently unemployed.  He endorses THC use daily with last use being this a.m.  On evaluation Samuel Martin reports over the past year he has continued to have auditory and visual hallucinations but states over the past month his voices have gotten more intrusive.  When describing his auditory hallucination he states that he hears his voice, family's voices and people he does not know voices.  He denies that the voices are command in nature.  States overall the voices are friendly but they do scare him at times.  He endorses visual hallucinations of seeing married people.  He presents today requesting to be restarted on his  medications to help with his hallucinations.  During evaluation Samuel Martin is observed sitting in the assessment room in no acute distress.  He is casually dressed and makes fair eye contact.  His speech is clear, coherent, at a normal rate and tone, but he is slow to respond at times.  This could be due to smoking marijuana before presenting to assessment.  He is alert/oriented x 4, cooperative and fairly attentive.  He endorses depressive symptoms that include decreased motivation, decreased focus, feelings of helplessness,, decreased sleep and decreased appetite.  He has a dysphoric affect.  He denies SI/HI/AVH.  He contracts for safety.  He denies any access to firearms/weapons.  He is forward thinking and talks about how he wants to get on his medications so he can seek employment.  He does not appear to be responding to internal/external stimuli.  He does not appear psychotic or manic.  He is able to answer questions appropriately.  Of note patient's mother was present for the assessment with patient's permission.  Discussed starting Risperdal 1 mg nightly.  Explained medication and adverse reactions.  Discussed patient discontinuing THC.  Patient and his mother verbalized understanding.  Patient was provided with a 14-day prescription with instructions to follow-up with outpatient services on the second floor ASAP for continued medication management and therapy.  Provided open access walk-in hours.  In addition Samuel Martin is educated and verbalizes understanding of mental health resources and other crisis services in the community.  He  is instructed to call 911 and present to the nearest emergency room should he experience any suicidal/homicidal ideation, auditory/visual/hallucinations, or detrimental worsening of he is r mental health condition.  He was a also advised by Probation officer that he could call the toll-free phone on back of  insurance card to assist with identifying counselors and agencies in  network.   [place] [facility] from 11/10/2022 in [facility] from 11/09/2022 in [facility] Emergency Department at [facility] ED from 12/23/2021 in [facility] Emergency Department at [place] No Risk No Risk No Risk       Psychiatric Specialty Exam  Presentation  General Appearance:Casual  Eye Contact:Fair  Speech:Clear and Coherent; Normal Rate  Speech Volume:Normal  Handedness:Right   Mood and Affect  Mood: Dysphoric  Affect: Congruent   Thought Process  Thought Processes: Coherent  Descriptions of Associations:Intact  Orientation:Full (Time, Place and Person)  Thought Content:Logical  Diagnosis of Schizophrenia or Schizoaffective disorder in past: Yes  Duration of Psychotic Symptoms: Greater than six months  Hallucinations:Auditory; Visual hears his voice, families voice and people he does not know voices sees married people  Ideas of Reference:None  Suicidal Thoughts:No  Homicidal Thoughts:No   Sensorium  Memory: Immediate Fair; Recent Fair; Remote Fair  Judgment: Fair  Insight: Fair   Community education officer  Concentration: Fair  Attention Span: Fair  Recall: Good  Fund of Knowledge: Poor  Language: Good   Psychomotor Activity  Psychomotor Activity: Normal   Assets  Assets: Communication Skills; Desire for Improvement; Physical Health; Resilience; Social Support; Catering manager; Housing   Sleep  Sleep: Fair  Number of hours:  6   Physical Exam: Physical Exam Vitals and nursing note reviewed.  Constitutional:      General: He is not in acute distress.    Appearance: Normal appearance. He is well-developed.  HENT:     Head: Normocephalic and atraumatic.  Eyes:     General:        Right eye: No discharge.        Left eye: No discharge.  Cardiovascular:     Rate and Rhythm: Normal rate.  Pulmonary:     Effort: Pulmonary  effort is normal. No respiratory distress.  Musculoskeletal:     Cervical back: Normal range of motion.  Skin:    Coloration: Skin is not jaundiced or pale.  Neurological:     Mental Status: He is alert and oriented to person, place, and time.  Psychiatric:        Attention and Perception: Attention normal. He perceives auditory and visual hallucinations.        Mood and Affect: Mood is depressed.        Speech: Speech normal.        Behavior: Behavior normal. Behavior is cooperative.        Thought Content: Thought content normal.        Cognition and Memory: Cognition normal.        Judgment: Judgment normal.    Review of Systems  Constitutional: Negative.   HENT: Negative.    Eyes: Negative.   Respiratory: Negative.    Cardiovascular: Negative.   Musculoskeletal: Negative.   Skin: Negative.   Neurological: Negative.   Psychiatric/Behavioral:  Positive for depression, hallucinations and substance abuse.    Blood pressure 113/68, pulse 72, temperature 97.7 F (36.5 C), temperature source Oral, resp. rate 18, SpO2 100 %. There is no height or weight  on file to calculate BMI.  Musculoskeletal: Strength & Muscle Tone: within normal limits Gait & Station: normal Patient leans: N/A   [facility] MSE Discharge Disposition for Follow up and Recommendations: Based on my evaluation the patient does not appear to have an emergency medical condition and can be discharged with resources and follow up care in outpatient services for Medication Management and Individual Therapy  Discharge patient  Provided prescription for 14-day supply of Risperdal 1 mg nightly for hallucinations.  Provided outpatient psychiatric resources for medication management and therapy.  Provided open access walk-in hours for behavior health services on the second floor.   Ardis Hughs, NP 11/10/2022, 6:17 PM

## 2022-11-18 ENCOUNTER — Encounter (HOSPITAL_COMMUNITY): Payer: Self-pay | Admitting: Physician Assistant

## 2022-11-18 ENCOUNTER — Ambulatory Visit (INDEPENDENT_AMBULATORY_CARE_PROVIDER_SITE_OTHER): Payer: Medicaid Other | Admitting: Physician Assistant

## 2022-11-18 ENCOUNTER — Telehealth (HOSPITAL_COMMUNITY): Payer: Self-pay | Admitting: Physician Assistant

## 2022-11-18 DIAGNOSIS — F411 Generalized anxiety disorder: Secondary | ICD-10-CM | POA: Insufficient documentation

## 2022-11-18 DIAGNOSIS — F331 Major depressive disorder, recurrent, moderate: Secondary | ICD-10-CM | POA: Insufficient documentation

## 2022-11-18 DIAGNOSIS — F200 Paranoid schizophrenia: Secondary | ICD-10-CM

## 2022-11-18 MED ORDER — RISPERIDONE 2 MG PO TABS: 2.0000 mg | ORAL_TABLET | Freq: Every day | ORAL | 1 refills | Status: DC
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
## 2022-12-30 ENCOUNTER — Encounter (HOSPITAL_COMMUNITY): Payer: Medicaid Other | Admitting: Physician Assistant

## 2023-02-02 ENCOUNTER — Ambulatory Visit (HOSPITAL_COMMUNITY)
Admission: EM | Admit: 2023-02-02 | Discharge: 2023-02-02 | Disposition: A | Discharge status: Home / Self Care | Payer: Medicaid Other | Attending: Internal Medicine | Admitting: Internal Medicine

## 2023-02-02 DIAGNOSIS — N3001 Acute cystitis with hematuria: Secondary | ICD-10-CM

## 2023-02-02 LAB — HIV ANTIBODY (ROUTINE TESTING W REFLEX): HIV Screen 4th Generation wRfx: NONREACTIVE

## 2023-02-02 LAB — POCT URINALYSIS DIP (MANUAL ENTRY)
Bilirubin, UA: NEGATIVE
Glucose, UA: NEGATIVE mg/dL
Ketones, POC UA: NEGATIVE mg/dL
Nitrite, UA: POSITIVE — AB
Protein Ur, POC: NEGATIVE mg/dL
Spec Grav, UA: 1.025 (ref 1.010–1.025)
Urobilinogen, UA: 0.2 E.U./dL
pH, UA: 6.5 (ref 5.0–8.0)

## 2023-02-02 MED ORDER — CEPHALEXIN 500 MG PO CAPS: 500.0000 mg | ORAL_CAPSULE | Freq: Two times a day (BID) | ORAL | 0 refills | Status: AC

## 2023-02-02 NOTE — ED Triage Notes (Signed)
Pt presents to the office for Dysuria x 3 days. Pt would like STD-testing with blood work.
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
## 2023-02-02 NOTE — Discharge Instructions (Addendum)
Please increase oral fluid intake Please take antibiotics as directed Will call you with recommendations if labs are abnormal or antibiotics needs to be changed Please return to urgent care if you have worsening symptoms.

## 2023-02-03 LAB — CYTOLOGY, (ORAL, ANAL, URETHRAL) ANCILLARY ONLY
Chlamydia: NEGATIVE
Comment: NEGATIVE
Comment: NEGATIVE
Comment: NORMAL
Neisseria Gonorrhea: NEGATIVE
Trichomonas: NEGATIVE

## 2023-02-03 LAB — RPR: RPR Ser Ql: NONREACTIVE

## 2023-02-04 LAB — URINE CULTURE: Culture: >=100000 — AB

## 2023-03-13 ENCOUNTER — Ambulatory Visit (INDEPENDENT_AMBULATORY_CARE_PROVIDER_SITE_OTHER): Payer: Medicaid Other | Admitting: Physician Assistant

## 2023-03-13 DIAGNOSIS — F331 Major depressive disorder, recurrent, moderate: Secondary | ICD-10-CM

## 2023-03-13 DIAGNOSIS — F200 Paranoid schizophrenia: Secondary | ICD-10-CM | POA: Diagnosis not present

## 2023-03-13 MED ORDER — RISPERIDONE 2 MG PO TABS: 2.0000 mg | ORAL_TABLET | Freq: Every day | ORAL | 1 refills | Status: DC
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
## 2023-03-16 ENCOUNTER — Encounter (HOSPITAL_COMMUNITY): Payer: Self-pay | Admitting: Physician Assistant

## 2023-04-24 ENCOUNTER — Encounter (HOSPITAL_COMMUNITY): Payer: MEDICAID | Admitting: Physician Assistant

## 2023-06-09 ENCOUNTER — Ambulatory Visit (HOSPITAL_COMMUNITY)
Admission: EM | Admit: 2023-06-09 | Discharge: 2023-06-09 | Disposition: A | Discharge status: Home / Self Care | Payer: MEDICAID | Attending: Nurse Practitioner | Admitting: Nurse Practitioner

## 2023-06-09 DIAGNOSIS — F200 Paranoid schizophrenia: Secondary | ICD-10-CM

## 2023-06-09 DIAGNOSIS — Z79899 Other long term (current) drug therapy: Secondary | ICD-10-CM | POA: Insufficient documentation

## 2023-06-09 DIAGNOSIS — R454 Irritability and anger: Secondary | ICD-10-CM | POA: Insufficient documentation

## 2023-06-09 DIAGNOSIS — F331 Major depressive disorder, recurrent, moderate: Secondary | ICD-10-CM

## 2023-06-09 DIAGNOSIS — Z760 Encounter for issue of repeat prescription: Secondary | ICD-10-CM | POA: Insufficient documentation

## 2023-06-09 DIAGNOSIS — Z91148 Patient's other noncompliance with medication regimen for other reason: Secondary | ICD-10-CM | POA: Insufficient documentation

## 2023-06-09 MED ORDER — RISPERIDONE 2 MG PO TABS: 2.0000 mg | ORAL_TABLET | Freq: Every day | ORAL | 1 refills | Status: DC

## 2023-06-09 NOTE — ED Provider Notes (Signed)
Behavioral Health Urgent Care Medical Screening Exam  Patient Name: Samuel Martin MRN: [id number] Date of Evaluation: 06/09/23 Chief Complaint:   Diagnosis:  Final diagnoses:  Medication refill    History of Present illness: Samuel Martin is a 27 y.o. male patient presented to [facility] as a walk in voluntarily alone with complaints of medication refill.   Samuel Martin, 27 y.o., male patient seen face to face by this provider, consulted with Dr. Lucianne Muss; and chart reviewed on 06/09/23.  On evaluation Samuel Martin reports that he has been off his Risperidone 2 mg at bedtime for around 2 months. He was previously getting OP follow up at [facility] OP services, and was last seen on 03/13/23. He reports starting a new job and missed his appointment in June, and states he was feeling well and didn't see the urgency in getting a refill. He states over the past few weeks he has felt himself me more irritable and quick to anger. His family and friends have noticed, and he states he wants to get back on his Risperidone. He denies any suicidal or homicidal ideations. Denies previous suicide attempts. He denies any auditory or visual hallucinations during the past two months without his medication. Denies any current AVH. He states "I do feel more irritable recently and I would rather go ahead and get back on the medications now before any of that stuff happens again." He denies problems with sleep or appetite. He has been working his new job for around 2-3 months, states it has been going well. He lives in an apartment with roommates. His current plan is to attend walk in hours at [facility] OP to establish care again sometime this week. He is able to contract for safety and is requesting to discharge with Risperidone refill.    During evaluation Samuel Martin is sitting in no acute distress.  He is alert, oriented x 4, calm, cooperative and attentive.  His mood is euthymic with congruent affect.  He has normal  speech, and behavior.  Objectively there is no evidence of psychosis/mania or delusional thinking.  Patient is able to converse coherently, goal directed thoughts, no distractibility, or pre-occupation.  He also denies suicidal/self-harm/homicidal ideation, psychosis, and paranoia.  Patient answered question appropriately.    Will send patient 14 days of Risperidone 2 mg at bedtime to preferred pharmacy. Instructed patient he needs to establish OP care for med management and follow up in which he expressed understanding. He requested a work note for today's visit which was given.    Flowsheet Row ED from 06/09/2023 in [facility] Clinical Support from 03/13/2023 in [facility] ED from 02/02/2023 in [facility] Urgent Care at [facility] RISK CATEGORY No Risk No Risk No Risk       Psychiatric Specialty Exam  Presentation  General Appearance:Appropriate for Environment  Eye Contact:Good  Speech:Clear and Coherent  Speech Volume:Normal  Handedness:Right   Mood and Affect  Mood: Euthymic  Affect: Congruent; Appropriate   Thought Process  Thought Processes: Coherent  Descriptions of Associations:Intact  Orientation:Full (Time, Place and Person)  Thought Content:WDL; Logical  Diagnosis of Schizophrenia or Schizoaffective disorder in past: No data recorded Duration of Psychotic Symptoms: No data recorded Hallucinations:None hears his voice, families voice and people he does not know voices sees married people  Ideas of Reference:None  Suicidal Thoughts:No  Homicidal Thoughts:No   Sensorium  Memory: Immediate Good; Recent Good  Judgment: Good  Insight: Good  Executive Functions  Concentration: Good  Attention Span: Good  Recall: Good  Fund of Knowledge: Good  Language: Good   Psychomotor Activity  Psychomotor Activity: Normal   Assets  Assets: Desire for Improvement;  Communication Skills; Housing; Physical Health; Social Support   Sleep  Sleep: Good  Number of hours:  6   Physical Exam: Physical Exam Musculoskeletal:        General: Normal range of motion.  Neurological:     Mental Status: He is alert and oriented to person, place, and time.  Psychiatric:        Attention and Perception: Attention normal.        Mood and Affect: Mood normal.        Speech: Speech normal.        Behavior: Behavior is cooperative.        Thought Content: Thought content normal.        Judgment: Judgment normal.    Review of Systems  Psychiatric/Behavioral:         Medication noncompliant x2 months. Feeling more irritable recently.   All other systems reviewed and are negative.  Blood pressure (!) 142/86, pulse 83, temperature 98.4 F (36.9 C), temperature source Oral, resp. rate 16, SpO2 100%. There is no height or weight on file to calculate BMI.  Musculoskeletal: Strength & Muscle Tone: within normal limits Gait & Station: normal Patient leans: N/A   [facility] MSE Discharge Disposition for Follow up and Recommendations: Based on my evaluation the patient does not appear to have an emergency medical condition and can be discharged with resources and follow up care in outpatient services for Medication Management  - Risperidone 2 mg at bedtime sent to [facility] pharmacy - Pt is psych cleared for discharge, and would benefit from outpatient services. Resources provided in AVS. - Work excuse note given  Samuel Bridegroom, NP 06/09/2023, 9:14 AM

## 2023-06-09 NOTE — ED Notes (Signed)
Patient A&O x 4, ambulatory. Patient discharged in no acute distress. Patient denied SI/HI, A/VH upon discharge. Patient verbalized understanding of all discharge instructions explained by staff, to include follow up appointments, RX's and safety plan. Pt belongings returned to patient from locker intact. Patient escorted to lobby via staff for transport to destination. Safety maintained.   

## 2023-06-09 NOTE — Progress Notes (Signed)
   06/09/23 0847  [facility] Triage Screening (Walk-ins at [facility] only)  How Did You Hear About [place]? Self  What Is the Reason for Your Visit/Call Today? Pt arrived to [facility] voluntarily unaccompanied by anyone. Pt states that he needs to get back on his medication. Pt states that he has been off his medication for almost a month now. Pt denies SI, HI, and AVH at this current moment. Pt also denies the use of alcohol and/or drugs at this present time. Pt states that he doesn't have a therapist or psychiatrist.  How Long Has This Been Causing You Problems? 1 wk - 1 month  Have You Recently Had Any Thoughts About Hurting Yourself? No  Are You Planning to Commit Suicide/Harm Yourself At This time? No  Have you Recently Had Thoughts About Hurting Someone Karolee Ohs? No  Are You Planning To Harm Someone At This Time? No  Are you currently experiencing any auditory, visual or other hallucinations? No  Have You Used Any Alcohol or Drugs in the Past 24 Hours? No  Do you have any current medical co-morbidities that require immediate attention? No  What Do You Feel Would Help You the Most Today? Medication(s)  If access to [facility] was not available, would you have sought care in the Emergency Department? No  Determination of Need Routine (7 days)  Options For Referral Medication Management

## 2023-06-09 NOTE — Discharge Instructions (Addendum)
  Outpatient psychiatric Services  Walk in hours for medication management Monday, Wednesday, Thursday, and Friday from 8:00 AM to 11:00 AM Recommend arriving by by 7:30 AM.  It is first come first serve.    Walk in hours for therapy intake Monday and Wednesday only 8:00 AM to 11:00 AM Encouraged to arrive by 7:30 AM.  It is first come first serve   Inpatient patient psychiatric services [facility] offers comprehensive behavioral heath care services for mental health and substance abuse treatment.  Social work can also assist with referral to or getting you into a rehabilitation program short or long term  COUNSELING AGENCIES in [place] (Accepting [facility])  [facility] [address] [place].  [phone]  [facility] [address] [place].    [phone]  Individual and [facility] [address] [place].    [phone]   Habla Espaol/Interprete  [facility] [place] [address].    [phone]  [facility] [address]  [place]"    [phone]   [facility] [address] [place].     [phone] [facility] ([facility]) [address] [place].  [phone]  Psychiatric services/servicios psiquiatricos  [facility] [address] [place]   [phone] [facility] [address]     [phone]  [facility] [address]      [phone]   Habla Espaol/Interprete & Psychiatric services/servicios psiquiatricos  [facility] [address]  [phone]  [facility] [address] [place].     [phone]  [facility] [address]    [phone] [facility] [address] (29yo & over only)     [phone]    [phone]  Provides information on mental health, intellectual/developmental  disabilities & substance abuse services in [place]   COUNSELING AGENCIES in [place] (Accepting [facility])  [facility] [address] [place].  [phone]  [facility] [address] [place].    [phone]  Individual and [facility] [address] [place].    [phone]   Habla Espaol/Interprete  [facility] of the [place] [address] [place].    [phone]  [facility] [address]  [place]"    [phone]   [facility] [address] [place].     [phone] [facility] (SEL) [address] [place].  [phone]  Psychiatric services/servicios psiquiatricos  [facility] [address] [place]   [phone] [facility] [address]     [phone]  [facility] [address]      [phone]   Habla Espaol/Interprete & Psychiatric services/servicios psiquiatricos  [facility] [address]  [phone]  [facility] [address] [place].     [phone]  [facility] [address]    [phone] Psychotherapeutic Services [address] (29yo & over only)     [phone]    [phone]  Provides information on mental health, intellectual/developmental disabilities & substance abuse services in [facility]

## 2023-07-08 ENCOUNTER — Encounter (HOSPITAL_COMMUNITY): Payer: MEDICAID | Admitting: Physician Assistant

## 2023-07-21 ENCOUNTER — Encounter (HOSPITAL_COMMUNITY): Payer: Self-pay | Admitting: Physician Assistant

## 2023-07-21 ENCOUNTER — Ambulatory Visit (HOSPITAL_COMMUNITY): Payer: MEDICAID | Admitting: Physician Assistant

## 2023-07-21 DIAGNOSIS — F331 Major depressive disorder, recurrent, moderate: Secondary | ICD-10-CM

## 2023-07-21 DIAGNOSIS — F200 Paranoid schizophrenia: Secondary | ICD-10-CM

## 2023-07-21 MED ORDER — RISPERIDONE 2 MG PO TABS: 2.0000 mg | ORAL_TABLET | Freq: Every day | ORAL | 1 refills | Status: DC
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
## 2023-08-25 ENCOUNTER — Ambulatory Visit (HOSPITAL_COMMUNITY)
Admission: EM | Admit: 2023-08-25 | Discharge: 2023-08-25 | Disposition: A | Discharge status: Home / Self Care | Payer: Medicaid Other | Attending: Emergency Medicine | Admitting: Emergency Medicine

## 2023-08-25 ENCOUNTER — Encounter (HOSPITAL_COMMUNITY): Payer: Self-pay | Admitting: Emergency Medicine

## 2023-08-25 ENCOUNTER — Other Ambulatory Visit: Payer: Self-pay

## 2023-08-25 DIAGNOSIS — H9201 Otalgia, right ear: Secondary | ICD-10-CM | POA: Insufficient documentation

## 2023-08-25 DIAGNOSIS — Z113 Encounter for screening for infections with a predominantly sexual mode of transmission: Secondary | ICD-10-CM | POA: Insufficient documentation

## 2023-08-25 DIAGNOSIS — R21 Rash and other nonspecific skin eruption: Secondary | ICD-10-CM | POA: Insufficient documentation

## 2023-08-25 DIAGNOSIS — H6121 Impacted cerumen, right ear: Secondary | ICD-10-CM | POA: Insufficient documentation

## 2023-08-25 LAB — HIV ANTIBODY (ROUTINE TESTING W REFLEX): HIV Screen 4th Generation wRfx: NONREACTIVE

## 2023-08-25 MED ORDER — CARBAMIDE PEROXIDE 6.5 % OT SOLN: 3.0000 [drp] | Freq: Two times a day (BID) | OTIC | 0 refills | Status: AC

## 2023-08-25 NOTE — Discharge Instructions (Addendum)
We will call you if anything on your swab or blood work returns positive. You can also see these results on MyChart. Please abstain from sexual intercourse until your results return.  I recommend using Debrox ear drops twice daily in the right ear. Use for the next 4-5 days. If no improvement in symptoms, please return and we can try to rinse out the wax.

## 2023-08-25 NOTE — ED Triage Notes (Signed)
Pt c/o right ear pain and left foot pain for the past few days, no fever or chills, denies any fall or injury.
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
## 2023-08-26 LAB — CYTOLOGY, (ORAL, ANAL, URETHRAL) ANCILLARY ONLY
Chlamydia: NEGATIVE
Comment: NEGATIVE
Comment: NEGATIVE
Comment: NORMAL
Neisseria Gonorrhea: NEGATIVE
Trichomonas: NEGATIVE

## 2023-08-26 LAB — RPR: RPR Ser Ql: NONREACTIVE

## 2023-09-01 ENCOUNTER — Encounter (HOSPITAL_COMMUNITY): Payer: MEDICAID | Admitting: Physician Assistant

## 2023-10-23 ENCOUNTER — Ambulatory Visit (INDEPENDENT_AMBULATORY_CARE_PROVIDER_SITE_OTHER): Payer: MEDICAID | Admitting: Physician Assistant

## 2023-10-23 VITALS — BP 147/82 | HR 66 | Ht 70.0 in | Wt 189.8 lb

## 2023-10-23 DIAGNOSIS — Z79899 Other long term (current) drug therapy: Secondary | ICD-10-CM | POA: Diagnosis not present

## 2023-10-23 DIAGNOSIS — Z758 Other problems related to medical facilities and other health care: Secondary | ICD-10-CM | POA: Diagnosis not present

## 2023-10-23 DIAGNOSIS — F331 Major depressive disorder, recurrent, moderate: Secondary | ICD-10-CM

## 2023-10-23 DIAGNOSIS — F200 Paranoid schizophrenia: Secondary | ICD-10-CM

## 2023-10-23 MED ORDER — SERTRALINE HCL 50 MG PO TABS: 50.0000 mg | ORAL_TABLET | Freq: Every day | ORAL | 1 refills | Status: DC

## 2023-10-23 MED ORDER — RISPERIDONE 2 MG PO TABS: 2.0000 mg | ORAL_TABLET | Freq: Every day | ORAL | 1 refills | Status: DC
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
## 2023-10-26 ENCOUNTER — Encounter (HOSPITAL_COMMUNITY): Payer: Self-pay | Admitting: Physician Assistant

## 2023-11-25 ENCOUNTER — Telehealth (HOSPITAL_COMMUNITY): Payer: Self-pay | Admitting: Physician Assistant

## 2023-11-25 NOTE — Telephone Encounter (Signed)
Provider spoke with patient regarding concerns over his medications.  Patient was informed that his medications were sent to his pharmacy.  Provider verified with pharmacy that patient's medications were available.  Patient was instructed to go back to pharmacy to request his medications.

## 2023-12-10 ENCOUNTER — Encounter (HOSPITAL_COMMUNITY): Payer: Self-pay | Admitting: Physician Assistant

## 2023-12-10 ENCOUNTER — Ambulatory Visit (HOSPITAL_COMMUNITY): Payer: MEDICAID | Admitting: Physician Assistant

## 2023-12-10 VITALS — BP 135/72 | HR 66 | Temp 98.0°F | Ht 70.0 in | Wt 182.8 lb

## 2023-12-10 DIAGNOSIS — F331 Major depressive disorder, recurrent, moderate: Secondary | ICD-10-CM | POA: Diagnosis not present

## 2023-12-10 DIAGNOSIS — F200 Paranoid schizophrenia: Secondary | ICD-10-CM | POA: Diagnosis not present

## 2023-12-10 DIAGNOSIS — Z79899 Other long term (current) drug therapy: Secondary | ICD-10-CM | POA: Diagnosis not present
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
## 2023-12-11 ENCOUNTER — Encounter (HOSPITAL_COMMUNITY): Payer: Self-pay | Admitting: Physician Assistant

## 2023-12-11 MED ORDER — RISPERIDONE 2 MG PO TABS: 2.0000 mg | ORAL_TABLET | Freq: Every day | ORAL | 2 refills | Status: DC

## 2023-12-11 MED ORDER — SERTRALINE HCL 50 MG PO TABS: 50.0000 mg | ORAL_TABLET | Freq: Every day | ORAL | 2 refills | Status: DC

## 2024-02-11 ENCOUNTER — Ambulatory Visit (INDEPENDENT_AMBULATORY_CARE_PROVIDER_SITE_OTHER): Payer: MEDICAID | Admitting: Physician Assistant

## 2024-02-11 VITALS — BP 136/78 | HR 50 | Ht 70.0 in | Wt 184.4 lb

## 2024-02-11 DIAGNOSIS — F200 Paranoid schizophrenia: Secondary | ICD-10-CM

## 2024-02-11 DIAGNOSIS — F331 Major depressive disorder, recurrent, moderate: Secondary | ICD-10-CM

## 2024-02-11 DIAGNOSIS — Z79899 Other long term (current) drug therapy: Secondary | ICD-10-CM
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
## 2024-02-14 MED ORDER — RISPERIDONE 2 MG PO TABS: 2.0000 mg | ORAL_TABLET | Freq: Every day | ORAL | 2 refills | Status: DC

## 2024-02-14 MED ORDER — SERTRALINE HCL 50 MG PO TABS
50.0000 mg | ORAL_TABLET | Freq: Every day | ORAL | 2 refills | Status: DC
Start: 2024-02-14 — End: 2024-04-14

## 2024-04-14 ENCOUNTER — Ambulatory Visit (INDEPENDENT_AMBULATORY_CARE_PROVIDER_SITE_OTHER): Payer: MEDICAID | Admitting: Physician Assistant

## 2024-04-14 ENCOUNTER — Encounter (HOSPITAL_COMMUNITY): Payer: Self-pay | Admitting: Physician Assistant

## 2024-04-14 VITALS — BP 132/71 | HR 63 | Temp 98.4°F | Ht 70.0 in | Wt 176.4 lb

## 2024-04-14 DIAGNOSIS — F200 Paranoid schizophrenia: Secondary | ICD-10-CM | POA: Diagnosis not present

## 2024-04-14 DIAGNOSIS — F331 Major depressive disorder, recurrent, moderate: Secondary | ICD-10-CM | POA: Diagnosis not present

## 2024-04-14 DIAGNOSIS — Z79899 Other long term (current) drug therapy: Secondary | ICD-10-CM | POA: Diagnosis not present

## 2024-04-14 MED ORDER — RISPERIDONE 2 MG PO TABS
2.0000 mg | ORAL_TABLET | Freq: Every day | ORAL | 2 refills | Status: DC
Start: 2024-04-14 — End: 2024-08-22

## 2024-04-14 MED ORDER — SERTRALINE HCL 50 MG PO TABS: 50.0000 mg | ORAL_TABLET | Freq: Every day | ORAL | 2 refills | Status: DC

## 2024-05-15 ENCOUNTER — Encounter (HOSPITAL_COMMUNITY): Payer: Self-pay | Admitting: Physician Assistant
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
## 2024-06-23 ENCOUNTER — Encounter (HOSPITAL_COMMUNITY): Payer: MEDICAID | Admitting: Physician Assistant

## 2024-06-28 ENCOUNTER — Emergency Department (HOSPITAL_COMMUNITY)
Admission: EM | Admit: 2024-06-28 | Discharge: 2024-06-28 | Disposition: A | Discharge status: Home / Self Care | Payer: Worker's Compensation

## 2024-06-28 ENCOUNTER — Encounter (HOSPITAL_COMMUNITY): Payer: Self-pay

## 2024-06-28 ENCOUNTER — Other Ambulatory Visit: Payer: Self-pay

## 2024-06-28 DIAGNOSIS — T24201A Burn of second degree of unspecified site of right lower limb, except ankle and foot, initial encounter: Secondary | ICD-10-CM

## 2024-06-28 DIAGNOSIS — T310 Burns involving less than 10% of body surface: Secondary | ICD-10-CM | POA: Diagnosis not present

## 2024-06-28 DIAGNOSIS — Y990 Civilian activity done for income or pay: Secondary | ICD-10-CM | POA: Diagnosis not present

## 2024-06-28 DIAGNOSIS — X118XXA Contact with other hot tap-water, initial encounter: Secondary | ICD-10-CM | POA: Diagnosis not present

## 2024-06-28 DIAGNOSIS — T25211A Burn of second degree of right ankle, initial encounter: Secondary | ICD-10-CM | POA: Insufficient documentation

## 2024-06-28 MED ORDER — BACITRACIN ZINC 500 UNIT/GM EX OINT: 1.0000 | TOPICAL_OINTMENT | Freq: Every day | CUTANEOUS | 0 refills | Status: DC

## 2024-06-28 MED ORDER — BACITRACIN ZINC 500 UNIT/GM EX OINT
TOPICAL_OINTMENT | Freq: Once | CUTANEOUS | Status: AC
  Filled 2024-06-28: qty 3.6
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
## 2024-06-29 NOTE — ED Notes (Signed)
 06/29/24 1150 opened chart to provide note, verifying pt was seen here for his job.

## 2024-08-19 ENCOUNTER — Telehealth (INDEPENDENT_AMBULATORY_CARE_PROVIDER_SITE_OTHER): Payer: MEDICAID | Admitting: Physician Assistant

## 2024-08-19 DIAGNOSIS — F200 Paranoid schizophrenia: Secondary | ICD-10-CM | POA: Diagnosis not present

## 2024-08-19 DIAGNOSIS — F331 Major depressive disorder, recurrent, moderate: Secondary | ICD-10-CM

## 2024-08-19 DIAGNOSIS — Z79899 Other long term (current) drug therapy: Secondary | ICD-10-CM | POA: Diagnosis not present

## 2024-08-22 ENCOUNTER — Encounter (HOSPITAL_COMMUNITY): Payer: Self-pay | Admitting: Physician Assistant

## 2024-08-22 MED ORDER — SERTRALINE HCL 50 MG PO TABS: 50.0000 mg | ORAL_TABLET | Freq: Every day | ORAL | 2 refills | Status: DC

## 2024-08-22 MED ORDER — RISPERIDONE 2 MG PO TABS: 2.0000 mg | ORAL_TABLET | Freq: Every day | ORAL | 2 refills | Status: DC
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
## 2024-08-31 ENCOUNTER — Ambulatory Visit (INDEPENDENT_AMBULATORY_CARE_PROVIDER_SITE_OTHER): Payer: MEDICAID

## 2024-08-31 DIAGNOSIS — Z79899 Other long term (current) drug therapy: Secondary | ICD-10-CM | POA: Diagnosis not present

## 2024-09-01 ENCOUNTER — Ambulatory Visit (HOSPITAL_COMMUNITY): Payer: Self-pay | Admitting: Psychiatry

## 2024-09-01 ENCOUNTER — Other Ambulatory Visit (HOSPITAL_COMMUNITY): Payer: Self-pay | Admitting: Psychiatry

## 2024-09-01 DIAGNOSIS — R9431 Abnormal electrocardiogram [ECG] [EKG]: Secondary | ICD-10-CM

## 2024-09-01 NOTE — Progress Notes (Signed)
 Patient referred to cardiology for further assessment

## 2024-10-07 ENCOUNTER — Ambulatory Visit: Payer: MEDICAID

## 2024-10-19 ENCOUNTER — Encounter (HOSPITAL_COMMUNITY): Payer: Self-pay | Admitting: Physician Assistant

## 2024-10-19 ENCOUNTER — Ambulatory Visit (INDEPENDENT_AMBULATORY_CARE_PROVIDER_SITE_OTHER): Payer: MEDICAID | Admitting: Physician Assistant

## 2024-10-19 VITALS — BP 129/76 | HR 57 | Ht 70.0 in | Wt 171.0 lb

## 2024-10-19 DIAGNOSIS — F331 Major depressive disorder, recurrent, moderate: Secondary | ICD-10-CM

## 2024-10-19 DIAGNOSIS — F200 Paranoid schizophrenia: Secondary | ICD-10-CM

## 2024-10-19 DIAGNOSIS — Z79899 Other long term (current) drug therapy: Secondary | ICD-10-CM | POA: Diagnosis not present

## 2024-10-19 MED ORDER — SERTRALINE HCL 50 MG PO TABS: 50.0000 mg | ORAL_TABLET | Freq: Every day | ORAL | 2 refills | Status: DC

## 2024-10-19 MED ORDER — RISPERIDONE 2 MG PO TABS: 2.0000 mg | ORAL_TABLET | Freq: Every day | ORAL | 2 refills | Status: DC

## 2024-10-21 ENCOUNTER — Telehealth (HOSPITAL_COMMUNITY): Payer: MEDICAID | Admitting: Physician Assistant

## 2024-11-04 ENCOUNTER — Other Ambulatory Visit: Payer: Self-pay

## 2024-11-04 ENCOUNTER — Ambulatory Visit (HOSPITAL_COMMUNITY)
Admission: EM | Admit: 2024-11-04 | Discharge: 2024-11-05 | Disposition: A | Discharge status: Other Acute Inpatient Hospital | Payer: MEDICAID | Attending: Nurse Practitioner | Admitting: Nurse Practitioner

## 2024-11-04 DIAGNOSIS — R9431 Abnormal electrocardiogram [ECG] [EKG]: Secondary | ICD-10-CM | POA: Diagnosis not present

## 2024-11-04 DIAGNOSIS — F200 Paranoid schizophrenia: Secondary | ICD-10-CM | POA: Diagnosis present

## 2024-11-04 DIAGNOSIS — Z91148 Patient's other noncompliance with medication regimen for other reason: Secondary | ICD-10-CM | POA: Diagnosis not present

## 2024-11-04 MED ORDER — MAGNESIUM HYDROXIDE 400 MG/5ML PO SUSP: 30.0000 mL | Freq: Every day | ORAL | Status: DC | PRN

## 2024-11-04 MED ORDER — TRAZODONE HCL 50 MG PO TABS: 50.0000 mg | ORAL_TABLET | Freq: Every evening | ORAL | Status: DC | PRN

## 2024-11-04 MED ORDER — DIPHENHYDRAMINE HCL 50 MG PO CAPS: 50.0000 mg | ORAL_CAPSULE | Freq: Three times a day (TID) | ORAL | Status: DC | PRN

## 2024-11-04 MED ORDER — ALUM & MAG HYDROXIDE-SIMETH 200-200-20 MG/5ML PO SUSP: 30.0000 mL | ORAL | Status: DC | PRN

## 2024-11-04 MED ORDER — LORAZEPAM 2 MG/ML IJ SOLN: 2.0000 mg | Freq: Three times a day (TID) | INTRAMUSCULAR | Status: DC | PRN

## 2024-11-04 MED ORDER — HALOPERIDOL LACTATE 5 MG/ML IJ SOLN: 5.0000 mg | Freq: Three times a day (TID) | INTRAMUSCULAR | Status: DC | PRN

## 2024-11-04 MED ORDER — DIPHENHYDRAMINE HCL 50 MG/ML IJ SOLN: 50.0000 mg | Freq: Three times a day (TID) | INTRAMUSCULAR | Status: DC | PRN

## 2024-11-04 MED ORDER — HYDROXYZINE HCL 25 MG PO TABS: 25.0000 mg | ORAL_TABLET | Freq: Three times a day (TID) | ORAL | Status: DC | PRN

## 2024-11-04 MED ORDER — RISPERIDONE 3 MG PO TABS
3.0000 mg | ORAL_TABLET | Freq: Every day | ORAL | Status: DC
  Administered 2024-11-04: 3 mg via ORAL
  Filled 2024-11-04: qty 1

## 2024-11-04 MED ORDER — RISPERIDONE 2 MG PO TABS: 2.0000 mg | ORAL_TABLET | Freq: Every morning | ORAL | Status: DC

## 2024-11-04 MED ORDER — HALOPERIDOL 5 MG PO TABS: 5.0000 mg | ORAL_TABLET | Freq: Three times a day (TID) | ORAL | Status: DC | PRN

## 2024-11-04 MED ORDER — ACETAMINOPHEN 325 MG PO TABS: 650.0000 mg | ORAL_TABLET | Freq: Four times a day (QID) | ORAL | Status: DC | PRN

## 2024-11-04 MED ORDER — HALOPERIDOL LACTATE 5 MG/ML IJ SOLN: 10.0000 mg | Freq: Three times a day (TID) | INTRAMUSCULAR | Status: DC | PRN

## 2024-11-04 NOTE — ED Notes (Signed)
 Patient A&O x4. Brought in for increasing AVH of demons over the past week with history of schizophrenia. Patient calm and cooperative with limited participation in assessments. Denies medication compliance for last months, but compliant today. Patient provided meal and drink when brought to [facility]. Monitoring for safety initiated.
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
## 2024-11-05 ENCOUNTER — Encounter (HOSPITAL_COMMUNITY): Payer: Self-pay | Admitting: Nurse Practitioner

## 2024-11-05 ENCOUNTER — Other Ambulatory Visit (HOSPITAL_COMMUNITY)
Admission: EM | Admit: 2024-11-05 | Discharge: 2024-11-07 | Disposition: A | Discharge status: Home / Self Care | Payer: MEDICAID | Source: Intra-hospital | Admitting: Nurse Practitioner

## 2024-11-05 DIAGNOSIS — Z91128 Patient's intentional underdosing of medication regimen for other reason: Secondary | ICD-10-CM | POA: Insufficient documentation

## 2024-11-05 DIAGNOSIS — F319 Bipolar disorder, unspecified: Secondary | ICD-10-CM | POA: Insufficient documentation

## 2024-11-05 DIAGNOSIS — D696 Thrombocytopenia, unspecified: Secondary | ICD-10-CM | POA: Insufficient documentation

## 2024-11-05 DIAGNOSIS — F200 Paranoid schizophrenia: Secondary | ICD-10-CM | POA: Diagnosis present

## 2024-11-05 DIAGNOSIS — F17210 Nicotine dependence, cigarettes, uncomplicated: Secondary | ICD-10-CM | POA: Insufficient documentation

## 2024-11-05 DIAGNOSIS — Z79899 Other long term (current) drug therapy: Secondary | ICD-10-CM | POA: Insufficient documentation

## 2024-11-05 DIAGNOSIS — Z91148 Patient's other noncompliance with medication regimen for other reason: Secondary | ICD-10-CM | POA: Insufficient documentation

## 2024-11-05 DIAGNOSIS — F12251 Cannabis dependence with psychotic disorder with hallucinations: Secondary | ICD-10-CM | POA: Insufficient documentation

## 2024-11-05 DIAGNOSIS — L568 Other specified acute skin changes due to ultraviolet radiation: Secondary | ICD-10-CM | POA: Insufficient documentation

## 2024-11-05 DIAGNOSIS — R9431 Abnormal electrocardiogram [ECG] [EKG]: Secondary | ICD-10-CM | POA: Insufficient documentation

## 2024-11-05 DIAGNOSIS — F1220 Cannabis dependence, uncomplicated: Secondary | ICD-10-CM

## 2024-11-05 DIAGNOSIS — F12951 Cannabis use, unspecified with psychotic disorder with hallucinations: Secondary | ICD-10-CM

## 2024-11-05 LAB — CBC WITH DIFFERENTIAL/PLATELET
Abs Immature Granulocytes: 0.01 10*3/uL (ref 0.00–0.07)
Basophils Absolute: 0 10*3/uL (ref 0.0–0.1)
Basophils Relative: 1 %
Eosinophils Absolute: 0.1 10*3/uL (ref 0.0–0.5)
Eosinophils Relative: 2 %
HCT: 44.5 % (ref 39.0–52.0)
Hemoglobin: 14.9 g/dL (ref 13.0–17.0)
Immature Granulocytes: 0 %
Lymphocytes Relative: 49 %
Lymphs Abs: 2.1 10*3/uL (ref 0.7–4.0)
MCH: 30 pg (ref 26.0–34.0)
MCHC: 33.5 g/dL (ref 30.0–36.0)
MCV: 89.7 fL (ref 80.0–100.0)
Monocytes Absolute: 0.4 10*3/uL (ref 0.1–1.0)
Monocytes Relative: 9 %
Neutro Abs: 1.7 10*3/uL (ref 1.7–7.7)
Neutrophils Relative %: 39 %
Platelets: 110 10*3/uL — ABNORMAL LOW (ref 150–400)
RBC: 4.96 MIL/uL (ref 4.22–5.81)
RDW: 13.2 % (ref 11.5–15.5)
WBC: 4.3 10*3/uL (ref 4.0–10.5)
nRBC: 0 % (ref 0.0–0.2)

## 2024-11-05 LAB — COMPREHENSIVE METABOLIC PANEL WITH GFR
ALT: 25 U/L (ref 0–44)
AST: 34 U/L (ref 15–41)
Albumin: 4.6 g/dL (ref 3.5–5.0)
Alkaline Phosphatase: 83 U/L (ref 38–126)
Anion gap: 13 (ref 5–15)
BUN: 12 mg/dL (ref 6–20)
CO2: 27 mmol/L (ref 22–32)
Calcium: 9.2 mg/dL (ref 8.9–10.3)
Chloride: 101 mmol/L (ref 98–111)
Creatinine, Ser: 1.01 mg/dL (ref 0.61–1.24)
GFR, Estimated: >60 mL/min
Glucose, Bld: 90 mg/dL (ref 70–99)
Potassium: 4.4 mmol/L (ref 3.5–5.1)
Sodium: 141 mmol/L (ref 135–145)
Total Bilirubin: 1.1 mg/dL (ref 0.0–1.2)
Total Protein: 6.7 g/dL (ref 6.5–8.1)

## 2024-11-05 LAB — POCT URINE DRUG SCREEN - MANUAL ENTRY (I-SCREEN)
POC Amphetamine UR: NOT DETECTED
POC Buprenorphine (BUP): NOT DETECTED
POC Cocaine UR: NOT DETECTED
POC Marijuana UR: POSITIVE — AB
POC Methadone UR: NOT DETECTED
POC Methamphetamine UR: NOT DETECTED
POC Morphine: NOT DETECTED
POC Oxazepam (BZO): NOT DETECTED
POC Oxycodone UR: NOT DETECTED
POC Secobarbital (BAR): NOT DETECTED

## 2024-11-05 LAB — LIPID PANEL
Cholesterol: 135 mg/dL (ref 0–200)
HDL: 71 mg/dL
LDL Cholesterol: 28 mg/dL (ref 0–99)
Total CHOL/HDL Ratio: 1.9 ratio
Triglycerides: 180 mg/dL — ABNORMAL HIGH
VLDL: 36 mg/dL (ref 0–40)

## 2024-11-05 LAB — ETHANOL: Alcohol, Ethyl (B): <15 mg/dL

## 2024-11-05 MED ORDER — HALOPERIDOL LACTATE 5 MG/ML IJ SOLN: 5.0000 mg | Freq: Three times a day (TID) | INTRAMUSCULAR | Status: DC | PRN

## 2024-11-05 MED ORDER — SERTRALINE HCL 50 MG PO TABS
50.0000 mg | ORAL_TABLET | Freq: Every day | ORAL | Status: DC
  Administered 2024-11-05 – 2024-11-07 (×3): 50 mg via ORAL
  Filled 2024-11-05 (×3): qty 1

## 2024-11-05 MED ORDER — HALOPERIDOL LACTATE 5 MG/ML IJ SOLN: 10.0000 mg | Freq: Three times a day (TID) | INTRAMUSCULAR | Status: DC | PRN

## 2024-11-05 MED ORDER — DIPHENHYDRAMINE HCL 50 MG/ML IJ SOLN: 50.0000 mg | Freq: Three times a day (TID) | INTRAMUSCULAR | Status: DC | PRN

## 2024-11-05 MED ORDER — HALOPERIDOL 5 MG PO TABS: 5.0000 mg | ORAL_TABLET | Freq: Three times a day (TID) | ORAL | Status: DC | PRN

## 2024-11-05 MED ORDER — RISPERIDONE 3 MG PO TABS: 3.0000 mg | ORAL_TABLET | Freq: Every day | ORAL | Status: DC

## 2024-11-05 MED ORDER — RISPERIDONE 2 MG PO TABS
2.0000 mg | ORAL_TABLET | Freq: Every morning | ORAL | Status: DC
  Administered 2024-11-05: 2 mg via ORAL
  Filled 2024-11-05: qty 1

## 2024-11-05 MED ORDER — LORAZEPAM 2 MG/ML IJ SOLN: 2.0000 mg | Freq: Three times a day (TID) | INTRAMUSCULAR | Status: DC | PRN

## 2024-11-05 MED ORDER — TRAZODONE HCL 50 MG PO TABS
50.0000 mg | ORAL_TABLET | Freq: Every evening | ORAL | Status: DC | PRN
  Administered 2024-11-05 – 2024-11-06 (×2): 50 mg via ORAL
  Filled 2024-11-05 (×2): qty 1

## 2024-11-05 MED ORDER — RISPERIDONE 2 MG PO TABS
2.0000 mg | ORAL_TABLET | Freq: Every day | ORAL | Status: DC
  Administered 2024-11-06: 2 mg via ORAL
  Filled 2024-11-05: qty 1

## 2024-11-05 MED ORDER — ALUM & MAG HYDROXIDE-SIMETH 200-200-20 MG/5ML PO SUSP: 30.0000 mL | ORAL | Status: DC | PRN

## 2024-11-05 MED ORDER — DIPHENHYDRAMINE HCL 50 MG PO CAPS: 50.0000 mg | ORAL_CAPSULE | Freq: Three times a day (TID) | ORAL | Status: DC | PRN

## 2024-11-05 MED ORDER — NICOTINE 14 MG/24HR TD PT24
14.0000 mg | MEDICATED_PATCH | Freq: Every day | TRANSDERMAL | Status: DC
  Administered 2024-11-06: 14 mg via TRANSDERMAL
  Filled 2024-11-05 (×3): qty 1

## 2024-11-05 MED ORDER — HYDROXYZINE HCL 25 MG PO TABS
25.0000 mg | ORAL_TABLET | Freq: Three times a day (TID) | ORAL | Status: DC | PRN
  Administered 2024-11-05: 25 mg via ORAL
  Filled 2024-11-05: qty 1

## 2024-11-05 MED ORDER — ACETAMINOPHEN 325 MG PO TABS: 650.0000 mg | ORAL_TABLET | Freq: Four times a day (QID) | ORAL | Status: DC | PRN

## 2024-11-05 MED ORDER — MAGNESIUM HYDROXIDE 400 MG/5ML PO SUSP: 30.0000 mL | Freq: Every day | ORAL | Status: DC | PRN

## 2024-11-05 NOTE — Discharge Instructions (Addendum)
 [facility] Care Management...  Patient declined residential treatment, requested out patient resources  Writer coordinated a in-person Medication Management appointment for Thursday 11/10/24 @ 1:30 pm with ...  [facility]  [address] [place], [place] [address] [phone]   Patient will discharge to home...  [address] [place], [place]  RN will arrange transportation  Scripts  Patient is recommended to coordinate appointment for SAIOP  Patient is encouraged/recommended to connect/coordinate appointment with PCP      Medicaid  and Non-Insured   SUBSTANCE USE TREATMENT FACILITIES   [facility] [employer]. [address] [place], [place], [place] [address] Phone: [phone]  [facility] [address], [place], [address] [phone] phone NOTE: [facility] is no longer offering IOP services.  Serves those who are low-income or have no insurance.  [facility] [address] [place], [place], [address] [phone] phone [phone] fax NOTE: Does have Substance Abuse-Intensive Outpatient Program [facility]) as well as transitional housing if eligible.  [facility] [address] [place], [place], [address] [phone] phone [phone] fax  [facility] [phone] [place], [place], [address] [phone] phone [phone] fax  PRIMARY CARE PROVIDERS:  ACCEPTS INDIVIDUALS WITH NO INSURANCE and MEDICAID   [facility] Address: [address], [place], [place] [address] Phone: [phone]   [facility] [place] [address], [place], [place] [address] [phone]   [facility] Address: [address] [place], [place], [place] [address] Areas served: [place] Phone: [phone]   [facility] - Primary Care Clinic Address: [address] [place], [place], [place] [address] Phone: [phone]
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
## 2024-11-05 NOTE — ED Notes (Signed)
 Pt received at the beginning of the shift bed resting quietly in no apparent distress. Safety maintained.
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
## 2024-11-05 NOTE — Care Management (Addendum)
 [facility] Care Management...  Addendum 5:49 pm  Patient declined residential treatment  Patient request discharge tomorrow 11/06/24  Patient will discharge to home... [address] [place], [place]  Bus passes  Scripts  Patient is recommended to coordinate appointment for SAIOP  Patient is encouraged/recommended to connect/coordinate appointment with PCP     Out patient resources and PCP listing have been provided in patient AVS  Writer met with patient to discuss discharge planning  Patient report MH/SA concerns. Patient reported not being med compliant. Patient reported smoking marijuana on daily basis. Patient reported living in a rooming house and that he enjoys being there and that he gets along very well with his roommates. Patient reported that he does work 7 am - 3 pm  Patient reported that dealing with his schizophrenia has been rough and that he has been self medicating using marijuana. Patient that he would like to stop smoking. However, unable to do residential treatment.   Patient reported interested in SAIOP and medication management.  Writer will follow up with  SAIOP programs and coordinate medication management

## 2024-11-05 NOTE — Progress Notes (Signed)
 Pt is awake, alert and appears to be thought blocking. Pt did not voice any complaints of pain or discomfort. No signs of acute distress noted. Administered scheduled meds per order. Pt denies current SI/HI/AVH, plan or intent. Staff will monitor for pt's safety.

## 2024-11-05 NOTE — ED Notes (Signed)
 Pt admitted to [facility] this shift. Here for AVH (seeing demons, hearing voices +1 wk) but refused to speak to this writer about hallucinations. Pt denies SI/HI. No reports of pain. No scheduled meds this shift. He was calm, cooperative, guarded, and apprehensive. No behavioral concerns at this time. Pt sleeping in bed, no acute distress noted. Respirations even and unlabored. Continue to monitor for safety.
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
## 2024-11-06 DIAGNOSIS — F200 Paranoid schizophrenia: Secondary | ICD-10-CM | POA: Diagnosis not present

## 2024-11-06 LAB — URINALYSIS, ROUTINE W REFLEX MICROSCOPIC
Bilirubin Urine: NEGATIVE
Glucose, UA: NEGATIVE mg/dL
Hgb urine dipstick: NEGATIVE
Ketones, ur: NEGATIVE mg/dL
Leukocytes,Ua: NEGATIVE
Nitrite: NEGATIVE
Protein, ur: NEGATIVE mg/dL
Specific Gravity, Urine: 1.027 (ref 1.005–1.030)
pH: 7 (ref 5.0–8.0)

## 2024-11-06 LAB — HIV ANTIBODY (ROUTINE TESTING W REFLEX): HIV Screen 4th Generation wRfx: NONREACTIVE

## 2024-11-06 NOTE — ED Notes (Signed)
 Pt received at the beginning of shift, remains disheveled, without complaints .  Safety maintained
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
## 2024-11-06 NOTE — ED Notes (Signed)
 Patient alert and oriented.  Denies SI, HI, AVH, and pain. Scheduled medications administered to patient, per MD orders. Support and encouragement provided.  Routine safety checks conducted every 15 min.  Patient informed to notify staff with problems or concerns. No adverse drug reactions noted. Patient contracts for safety at this time. Patient compliant with medications and treatment plan. Patient receptive, calm, and cooperative. Patient interacts well with others on the unit.  Patient remains safe at this time.

## 2024-11-06 NOTE — ED Provider Notes (Addendum)
 Behavioral Health Progress Note  Date and Time: 11/06/2024 5:27 PM Name: Samuel Martin MRN:  [id number]  Subjective:  Patient reports he is doing well and he wants to go. He is ready to go back to work he says. He feels much better. He says his mood is mild and calm. He cannot be here long he said because he needs to work.   Diagnosis:  Final diagnoses:  Cannabis-induced psychotic disorder with hallucinations (HCC)  Cannabis use disorder, severe, dependence (HCC)  Schizophrenia, paranoid type (HCC)  Nonspecific abnormal electrocardiogram (ECG) (EKG)    Total Time spent with patient: 30 minutes  Past Psychiatric History: Paranoid schizophrenia, bipolar 1 disorder, MDD, cannabis induced psychotic disorder with hallucinations, cannabis use disorder  He reports he has been psychiatrically hospitalized btw 5-10 times. He denies suicide attempts Past Medical History: Psoriasis  Family Psychiatric  History: Mother Bipolar disorder Social History: Resides in [place] with 6 roommates. He calls it a rooming house. Denies access to weapons.  Employed in the control and instrumentation engineer.  Endorses daily THC use.  Intermittent alcohol use 1 drink, 2 times per month, average.  Sleep: Good  Appetite:  Good  Current Medications:  Current Facility-Administered Medications  Medication Dose Route Frequency Provider Last Rate Last Admin   acetaminophen  (TYLENOL ) tablet 650 mg  650 mg Oral Q6H PRN Bobbitt, Shalon E, NP       alum & mag hydroxide-simeth (MAALOX/MYLANTA) 200-200-20 MG/5ML suspension 30 mL  30 mL Oral Q4H PRN Bobbitt, Shalon E, NP       haloperidol  (HALDOL ) tablet 5 mg  5 mg Oral TID PRN Bobbitt, Shalon E, NP       And   diphenhydrAMINE  (BENADRYL ) capsule 50 mg  50 mg Oral TID PRN Bobbitt, Shalon E, NP       haloperidol  lactate (HALDOL ) injection 5 mg  5 mg Intramuscular TID PRN Bobbitt, Shalon E, NP       And   diphenhydrAMINE  (BENADRYL ) injection 50 mg  50 mg Intramuscular TID PRN Bobbitt,  Shalon E, NP       And   LORazepam  (ATIVAN ) injection 2 mg  2 mg Intramuscular TID PRN Bobbitt, Shalon E, NP       haloperidol  lactate (HALDOL ) injection 10 mg  10 mg Intramuscular TID PRN Bobbitt, Shalon E, NP       And   diphenhydrAMINE  (BENADRYL ) injection 50 mg  50 mg Intramuscular TID PRN Bobbitt, Shalon E, NP       And   LORazepam  (ATIVAN ) injection 2 mg  2 mg Intramuscular TID PRN Bobbitt, Shalon E, NP       hydrOXYzine  (ATARAX ) tablet 25 mg  25 mg Oral TID PRN Bobbitt, Shalon E, NP   25 mg at 11/05/24 2139   magnesium  hydroxide (MILK OF MAGNESIA) suspension 30 mL  30 mL Oral Daily PRN Bobbitt, Shalon E, NP       nicotine  (NICODERM CQ  - dosed in mg/24 hours) patch 14 mg  14 mg Transdermal Daily Dasie Ellouise CROME, FNP   14 mg at 11/06/24 9096   risperiDONE  (RISPERDAL ) tablet 2 mg  2 mg Oral QHS Dasie Ellouise CROME, FNP       sertraline  (ZOLOFT ) tablet 50 mg  50 mg Oral Daily Bobbitt, Shalon E, NP   50 mg at 11/06/24 0904   traZODone  (DESYREL ) tablet 50 mg  50 mg Oral QHS PRN Bobbitt, Shalon E, NP   50 mg at 11/05/24 2139   No current outpatient medications on  file.    Labs  Lab Results:  Admission on 11/05/2024  Component Date Value Ref Range Status   POC Amphetamine UR 11/05/2024 None Detected  NONE DETECTED (Cut Off Level 1000 ng/mL) Final   POC Secobarbital (BAR) 11/05/2024 None Detected  NONE DETECTED (Cut Off Level 300 ng/mL) Final   POC Buprenorphine (BUP) 11/05/2024 None Detected  NONE DETECTED (Cut Off Level 10 ng/mL) Final   POC Oxazepam (BZO) 11/05/2024 None Detected  NONE DETECTED (Cut Off Level 300 ng/mL) Final   POC Cocaine UR 11/05/2024 None Detected  NONE DETECTED (Cut Off Level 300 ng/mL) Final   POC Methamphetamine UR 11/05/2024 None Detected  NONE DETECTED (Cut Off Level 1000 ng/mL) Final   POC Morphine 11/05/2024 None Detected  NONE DETECTED (Cut Off Level 300 ng/mL) Final   POC Methadone UR 11/05/2024 None Detected  NONE DETECTED (Cut Off Level 300 ng/mL) Final   POC  Oxycodone UR 11/05/2024 None Detected  NONE DETECTED (Cut Off Level 100 ng/mL) Final   POC Marijuana UR 11/05/2024 Positive (A)  NONE DETECTED (Cut Off Level 50 ng/mL) Final  Admission on 11/04/2024, Discharged on 11/05/2024  Component Date Value Ref Range Status   WBC 11/04/2024 4.3  4.0 - 10.5 K/uL Final   RBC 11/04/2024 4.96  4.22 - 5.81 MIL/uL Final   Hemoglobin 11/04/2024 14.9  13.0 - 17.0 g/dL Final   HCT 98/76/7973 44.5  39.0 - 52.0 % Final   MCV 11/04/2024 89.7  80.0 - 100.0 fL Final   MCH 11/04/2024 30.0  26.0 - 34.0 pg Final   MCHC 11/04/2024 33.5  30.0 - 36.0 g/dL Final   RDW 98/76/7973 13.2  11.5 - 15.5 % Final   Platelets 11/04/2024 110 (L)  150 - 400 K/uL Final   nRBC 11/04/2024 0.0  0.0 - 0.2 % Final   Neutrophils Relative % 11/04/2024 39  % Final   Neutro Abs 11/04/2024 1.7  1.7 - 7.7 K/uL Final   Lymphocytes Relative 11/04/2024 49  % Final   Lymphs Abs 11/04/2024 2.1  0.7 - 4.0 K/uL Final   Monocytes Relative 11/04/2024 9  % Final   Monocytes Absolute 11/04/2024 0.4  0.1 - 1.0 K/uL Final   Eosinophils Relative 11/04/2024 2  % Final   Eosinophils Absolute 11/04/2024 0.1  0.0 - 0.5 K/uL Final   Basophils Relative 11/04/2024 1  % Final   Basophils Absolute 11/04/2024 0.0  0.0 - 0.1 K/uL Final   Immature Granulocytes 11/04/2024 0  % Final   Abs Immature Granulocytes 11/04/2024 0.01  0.00 - 0.07 K/uL Final   Performed at [facility], [address] [place], [place] [address]   Sodium 11/04/2024 141  135 - 145 mmol/L Final   Potassium 11/04/2024 4.4  3.5 - 5.1 mmol/L Final   Chloride 11/04/2024 101  98 - 111 mmol/L Final   CO2 11/04/2024 27  22 - 32 mmol/L Final   Glucose, Bld 11/04/2024 90  70 - 99 mg/dL Final   Glucose reference range applies only to samples taken after fasting for at least 8 hours.   BUN 11/04/2024 12  6 - 20 mg/dL Final   Creatinine, Ser 11/04/2024 1.01  0.61 - 1.24 mg/dL Final   Calcium 98/76/7973 9.2  8.9 - 10.3 mg/dL Final   Total Protein  11/04/2024 6.7  6.5 - 8.1 g/dL Final   Albumin 98/76/7973 4.6  3.5 - 5.0 g/dL Final   AST 98/76/7973 34  15 - 41 U/L Final  ALT 11/04/2024 25  0 - 44 U/L Final   Alkaline Phosphatase 11/04/2024 83  38 - 126 U/L Final   Total Bilirubin 11/04/2024 1.1  0.0 - 1.2 mg/dL Final   GFR, Estimated 11/04/2024 >60  >60 mL/min Final   Comment: (NOTE) Calculated using the CKD-EPI Creatinine Equation (2021)    Anion gap 11/04/2024 13  5 - 15 Final   Performed at [facility], [address], [place], [place] [address]   Alcohol, Ethyl (B) 11/04/2024 <15  <15 mg/dL Final   Comment: (NOTE) For medical purposes only. Performed at [facility], [address], [place], [place] [address]    Cholesterol 11/04/2024 135  0 - 200 mg/dL Final   Comment:        ATP III CLASSIFICATION:  <200     mg/dL   Desirable  799-760  mg/dL   Borderline High  >=759    mg/dL   High           Triglycerides 11/04/2024 180 (H)  <150 mg/dL Final   HDL 98/76/7973 71  >40 mg/dL Final   Total CHOL/HDL Ratio 11/04/2024 1.9  RATIO Final   VLDL 11/04/2024 36  0 - 40 mg/dL Final   LDL Cholesterol 11/04/2024 28  0 - 99 mg/dL Final   Comment:        Total Cholesterol/HDL:CHD Risk Coronary Heart Disease Risk Table                     Men   Women  1/2 Average Risk   3.4   3.3  Average Risk       5.0   4.4  2 X Average Risk   9.6   7.1  3 X Average Risk  23.4   11.0        Use the calculated Patient Ratio above and the CHD Risk Table to determine the patient's CHD Risk.        ATP III CLASSIFICATION (LDL):  <100     mg/dL   Optimal  899-870  mg/dL   Near or Above                    Optimal  130-159  mg/dL   Borderline  839-810  mg/dL   High  >809     mg/dL   Very High Performed at [facility], [address] [place], [place] [address]     Blood Alcohol level:  Lab Results  Component Value Date   [facility] <15 11/04/2024   ETH <10 11/09/2022    Metabolic Disorder Labs: Lab Results  Component  Value Date   HGBA1C 5.6 12/24/2021   MPG 114 12/24/2021   MPG 111.15 09/01/2019   Lab Results  Component Value Date   PROLACTIN 9.2 09/01/2019   PROLACTIN 4.1 01/20/2018   Lab Results  Component Value Date   CHOL 135 11/04/2024   TRIG 180 (H) 11/04/2024   HDL 71 11/04/2024   CHOLHDL 1.9 11/04/2024   VLDL 36 11/04/2024   LDLCALC 28 11/04/2024   LDLCALC 74 12/24/2021    Therapeutic Lab Levels: No results found for: LITHIUM Lab Results  Component Value Date   VALPROATE 68 01/20/2018   VALPROATE 83 02/03/2017   No results found for: CBMZ  Physical Findings   AIMS    Flowsheet Row Clinical Support from 10/19/2024 in [facility] Video Visit from 08/19/2024 in [facility] Clinical Support from 04/14/2024 in [facility] Clinical Support from 02/11/2024 in [facility]  AIMS Total Score 0 0 0 0 0   AUDIT    Flowsheet Row Admission (Discharged) from [facility] Ad[facility] (Discharged[facility] Ad[facility] (Discharged) [facility] Admission (Discharged) from 01/27/2017 in [facility] Admission (Discharged) [facility]  Alcohol Use Disorder Identification Test Final Score (AUDIT) 2 1 2 16  0   GAD-7    Flowsheet Row Clinical Support from 10/19/2024 in [facility] Video Visit from 08/19/2024 in [facility] Clinical Support from 04/14/2024 in [facility] Clinical Support from 02/11/2024 in [facility] Clinical Support from 12/10/2023 in [facility]  Total GAD-7 Score 8 0 0 9 11   PHQ2-9    Flowsheet Row ED from 11/05/2024 in [facility] Clinical Support from 10/19/2024 in [facility] Video Visit from 08/19/2024 in [facility] Clinical Support from 04/14/2024 in [facility] Clinical Support from 02/11/2024 in [facility]  PHQ-2 Total Score 0 2 0 0 0  PHQ-9 Total Score 4 3 -- -- --   Flowsheet Row ED from 11/05/2024 in [facility] ED from 11/04/2024 in [facility] Clinical Support from 10/19/2024 in [facility]  C-SSRS RISK CATEGORY No Risk No Risk No Risk     Musculoskeletal  Strength & Muscle Tone: within normal limits Gait & Station: normal Patient leans: N/A  Mental Status Exam:  Appearance: fair grooming, good eye contact  Behavior:  WNL  Attitude:  cooperative  Speech:  normal rate, vol and rythm  Mood:  better  Affect: euthymic  Thought Process:  logical perseverative  Thought Content:  denies SI/HI/AH/VH, delusions  SI/HI: denies  Perceptions:  denying abnormalities  Judgment: Lacking  Insight: Shallow  Fund of Knowledge: limited     Physical Exam  Physical Exam Constitutional:      Appearance: Normal appearance.  HENT:     Head: Normocephalic and atraumatic.  Eyes:     Conjunctiva/sclera: Conjunctivae normal.  Pulmonary:     Effort: Pulmonary effort is normal.  Musculoskeletal:     Cervical back: Normal range of motion.  Neurological:     Mental Status: He is alert.    Review of Systems  Constitutional:  Negative for chills and fever.  HENT:  Negative for hearing loss and tinnitus.   Eyes:  Negative for blurred vision and double vision.  Respiratory:  Negative for cough.   Cardiovascular:  Negative for chest pain.  Gastrointestinal:  Negative for  heartburn, nausea and vomiting.  Genitourinary:  Negative for dysuria, frequency and urgency.  Skin:  Negative for rash.  Neurological:  Negative for dizziness, tingling, tremors and headaches.  Psychiatric/Behavioral:  Positive for substance abuse. Negative for depression, hallucinations and suicidal ideas. The patient is not nervous/anxious and does not have insomnia.    Blood pressure 138/82, pulse 76, temperature 98.7 F (37.1 C), resp. rate 17, SpO2 98%. There is no height or weight on file to calculate BMI.  Treatment  Plan Summary: Daily contact with patient to assess and evaluate symptoms and progress in treatment, Medication management, and Plan Continued admission at [facility]. Continued admission at [facility].   11/05/2024: No new lab orders placed. UDS pending. EKG 11/04/2024 with NSR ST elevation.  EKG reviewed with Dr. Jakie, EDP.  Chronic changes since 2022, likely early repolarization.  Will recommend follow-up with cardiology once discharged.   Medications: Continue risperidone  2 mg nightly//mood Start nicotine  transdermal 14 mg patch daily/nicotine  withdrawal.   Continue current medications: Current medications: -Acetaminophen  650 mg every 6 hours, as needed/mild pain -Maalox 30 mL oral every 4 hours, as needed/digestion -Hydroxyzine  25 mg 3 times daily as needed/anxiety -Magnesium  hydroxide 30 mL daily as needed/mild constipation -Sertraline  50 mg daily/mood -Trazodone  50 mg nightly, as needed/sleep -Risperdal  2 mg at bedtime for Schizophrenia  Recheck CBC due to thrombocytopenia Pt should go to PMD for the same.   -Agitation protocol   Discharge Planning: [place] would like IOP specifically meetings and evening or virtual to accommodate his work schedule. Disposition/case management to assist with discharge planning and identification of follow-up needs including residential or outpatient substance use treatment options as needed, prior to discharge Cardiology and PCP  resources provided. Discharge Concerns: Need to confirm safety plan; Medication compliance and effectiveness Discharge Goals: Return home with outpatient referrals for mental health follow-up including medication management/psychotherapy and substance use treatment follow-up resources as appropriate Pt wants to be discharged but does not have insight and would not sign 72 hr notice. Due to weather conditions and patient's lack of insight and no transportation patient will stay another day.   Garvin JINNY Gaines, MD 11/06/2024 5:27 PM
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
## 2024-11-06 NOTE — ED Notes (Signed)
 Pt is able to answer simple short questions appropriated, offering occasional eye contact. Deniers AVHI during time of nurse patient interview.  Affect is odd. Pt is unkempt although donned in hospital scrubs/  Safety maintained.affe

## 2024-11-07 ENCOUNTER — Encounter (HOSPITAL_COMMUNITY): Payer: Self-pay

## 2024-11-07 DIAGNOSIS — F200 Paranoid schizophrenia: Secondary | ICD-10-CM | POA: Diagnosis not present

## 2024-11-07 LAB — SYPHILIS: RPR W/REFLEX TO RPR TITER AND TREPONEMAL ANTIBODIES, TRADITIONAL SCREENING AND DIAGNOSIS ALGORITHM: RPR Ser Ql: NONREACTIVE

## 2024-11-07 MED ORDER — RISPERIDONE 2 MG PO TABS: 2.0000 mg | ORAL_TABLET | Freq: Every day | ORAL | 0 refills | Status: DC

## 2024-11-07 MED ORDER — NICOTINE 14 MG/24HR TD PT24: 14.0000 mg | MEDICATED_PATCH | Freq: Every day | TRANSDERMAL | 0 refills | Status: AC

## 2024-11-07 MED ORDER — SERTRALINE HCL 50 MG PO TABS: 50.0000 mg | ORAL_TABLET | Freq: Every day | ORAL | 0 refills | Status: DC

## 2024-11-07 MED ORDER — HYDROXYZINE HCL 25 MG PO TABS: 25.0000 mg | ORAL_TABLET | Freq: Three times a day (TID) | ORAL | 0 refills | Status: DC | PRN

## 2024-11-07 MED ORDER — TRAZODONE HCL 50 MG PO TABS: 50.0000 mg | ORAL_TABLET | Freq: Every evening | ORAL | 0 refills | Status: DC | PRN

## 2024-11-07 NOTE — ED Notes (Signed)
 Pt slept throughout the night without issue. Pt observed breathing easy and unlabored during safety rounds. Will continue to monitor and report changes as noted. Safety maintained.

## 2024-11-07 NOTE — ED Notes (Signed)
 Pt is calm and pleasant on approach, reports feeling fine. Medication compliant and offers no concerns. Will continue to monitor and report changes as noted.

## 2024-11-07 NOTE — ED Notes (Signed)
 Pt presents as pleasant , calm. Pt reports feeling 'good', denies si hi and avh, verbal contract for safety provided. Pt denies physical pain and discomforts. Pt ate breakfast. Medications reviewed, questions denied.

## 2024-11-07 NOTE — Care Management (Addendum)
 [facility] Care Management...  Patient requested out patient services  Patient will discharge today Monday 11/07/24 to home.[facility]  [address]  [place], [place] [address]  RN will arrange transportation  Programme Researcher, Broadcasting/film/video coordinated a in-person Medication Management appointment for Thursday 11/10/24 @ 1:30 pm with ...  [facility]  [address] [place], [place] [address] [phone]   Writer provided patient with listing of out patient resources  Writer provided patient with listing of Primary Care Providers  Writer provided patient with work note
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
## 2024-11-07 NOTE — ED Notes (Signed)
 Discharge paperwork discussed with pt  including medication, AVS, follow up care, pt expressed understanding. Pt escorted off unit to taxi by staff. Items returned to pt.

## 2024-11-10 ENCOUNTER — Encounter (HOSPITAL_COMMUNITY): Admitting: Physician Assistant

## 2024-11-11 ENCOUNTER — Encounter (HOSPITAL_COMMUNITY): Payer: Self-pay | Admitting: Physician Assistant
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
## 2024-11-17 ENCOUNTER — Ambulatory Visit (HOSPITAL_COMMUNITY): Admitting: Physician Assistant

## 2024-11-17 ENCOUNTER — Encounter (HOSPITAL_COMMUNITY): Payer: Self-pay | Admitting: Physician Assistant

## 2024-11-17 VITALS — BP 134/89 | HR 54 | Ht 70.0 in | Wt 182.4 lb

## 2024-11-17 DIAGNOSIS — F331 Major depressive disorder, recurrent, moderate: Secondary | ICD-10-CM

## 2024-11-17 DIAGNOSIS — F200 Paranoid schizophrenia: Secondary | ICD-10-CM

## 2024-11-17 MED ORDER — RISPERIDONE 2 MG PO TABS: 2.0000 mg | ORAL_TABLET | Freq: Every day | ORAL | 2 refills | Status: AC

## 2024-11-17 MED ORDER — SERTRALINE HCL 50 MG PO TABS: 50.0000 mg | ORAL_TABLET | Freq: Every day | ORAL | 2 refills | Status: AC

## 2024-11-17 MED ORDER — HYDROXYZINE HCL 25 MG PO TABS: 25.0000 mg | ORAL_TABLET | Freq: Three times a day (TID) | ORAL | 1 refills | Status: AC | PRN

## 2024-11-17 MED ORDER — TRAZODONE HCL 50 MG PO TABS: 50.0000 mg | ORAL_TABLET | Freq: Every evening | ORAL | 2 refills | Status: AC | PRN
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
## 2024-12-21 ENCOUNTER — Encounter (HOSPITAL_COMMUNITY): Payer: MEDICAID | Admitting: Physician Assistant

## 2025-01-05 ENCOUNTER — Encounter (HOSPITAL_COMMUNITY): Admitting: Physician Assistant
